# Patient Record
Sex: Female | Born: 1995 | Race: Black or African American | Hispanic: No | Marital: Single | Smoking: Never smoker
Health system: Southern US, Community
[De-identification: ages and names within clinical notes are randomized; demographics above are authoritative.]

## PROBLEM LIST (undated history)

## (undated) ENCOUNTER — Inpatient Hospital Stay (HOSPITAL_COMMUNITY): Payer: Self-pay

## (undated) DIAGNOSIS — R519 Headache, unspecified: Secondary | ICD-10-CM

## (undated) DIAGNOSIS — N390 Urinary tract infection, site not specified: Secondary | ICD-10-CM

## (undated) DIAGNOSIS — R87629 Unspecified abnormal cytological findings in specimens from vagina: Secondary | ICD-10-CM

## (undated) DIAGNOSIS — R51 Headache: Secondary | ICD-10-CM

## (undated) DIAGNOSIS — D649 Anemia, unspecified: Secondary | ICD-10-CM

## (undated) DIAGNOSIS — F419 Anxiety disorder, unspecified: Secondary | ICD-10-CM

## (undated) DIAGNOSIS — L309 Dermatitis, unspecified: Secondary | ICD-10-CM

## (undated) DIAGNOSIS — F32A Depression, unspecified: Secondary | ICD-10-CM

## (undated) HISTORY — PX: NO PAST SURGERIES: SHX2092

---

## 2013-02-24 ENCOUNTER — Emergency Department (HOSPITAL_COMMUNITY)
Admission: EM | Admit: 2013-02-24 | Discharge: 2013-02-24 | Disposition: A | Discharge status: Home / Self Care | Payer: BC Managed Care – PPO | Attending: Emergency Medicine | Admitting: Emergency Medicine

## 2013-02-24 ENCOUNTER — Encounter (HOSPITAL_COMMUNITY): Payer: Self-pay | Admitting: Emergency Medicine

## 2013-02-24 DIAGNOSIS — T23101A Burn of first degree of right hand, unspecified site, initial encounter: Secondary | ICD-10-CM

## 2013-02-24 DIAGNOSIS — Y929 Unspecified place or not applicable: Secondary | ICD-10-CM | POA: Insufficient documentation

## 2013-02-24 DIAGNOSIS — T23079A Burn of unspecified degree of unspecified wrist, initial encounter: Secondary | ICD-10-CM | POA: Insufficient documentation

## 2013-02-24 DIAGNOSIS — T23009A Burn of unspecified degree of unspecified hand, unspecified site, initial encounter: Secondary | ICD-10-CM | POA: Insufficient documentation

## 2013-02-24 DIAGNOSIS — W409XXA Explosion of unspecified explosive materials, initial encounter: Secondary | ICD-10-CM | POA: Insufficient documentation

## 2013-02-24 DIAGNOSIS — T2101XA Burn of unspecified degree of chest wall, initial encounter: Secondary | ICD-10-CM | POA: Insufficient documentation

## 2013-02-24 DIAGNOSIS — Y9389 Activity, other specified: Secondary | ICD-10-CM | POA: Insufficient documentation

## 2013-02-24 MED ORDER — SILVER SULFADIAZINE 1 % EX CREA: 1.0000 "application " | TOPICAL_CREAM | Freq: Every day | CUTANEOUS | Status: DC

## 2013-02-24 MED ORDER — SILVER SULFADIAZINE 1 % EX CREA
TOPICAL_CREAM | Freq: Once | CUTANEOUS | Status: AC
  Administered 2013-02-24: 20:00:00 via TOPICAL
  Filled 2013-02-24: qty 50
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
## 2013-02-24 NOTE — ED Provider Notes (Signed)
Medical screening examination/treatment/procedure(s) were performed by non-physician practitioner and as supervising physician I was immediately available for consultation/collaboration.  EKG Interpretation   None         Retaj Hilbun L Alcide Memoli, MD 02/24/13 2347 
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
## 2014-06-01 ENCOUNTER — Emergency Department (HOSPITAL_COMMUNITY)
Admission: EM | Admit: 2014-06-01 | Discharge: 2014-06-02 | Disposition: A | Discharge status: Home / Self Care | Payer: BLUE CROSS/BLUE SHIELD | Attending: Emergency Medicine | Admitting: Emergency Medicine

## 2014-06-01 ENCOUNTER — Encounter (HOSPITAL_COMMUNITY): Payer: Self-pay

## 2014-06-01 ENCOUNTER — Emergency Department (HOSPITAL_COMMUNITY): Payer: BLUE CROSS/BLUE SHIELD

## 2014-06-01 DIAGNOSIS — Z793 Long term (current) use of hormonal contraceptives: Secondary | ICD-10-CM | POA: Diagnosis not present

## 2014-06-01 DIAGNOSIS — F410 Panic disorder [episodic paroxysmal anxiety] without agoraphobia: Secondary | ICD-10-CM | POA: Diagnosis not present

## 2014-06-01 DIAGNOSIS — N920 Excessive and frequent menstruation with regular cycle: Secondary | ICD-10-CM | POA: Diagnosis not present

## 2014-06-01 DIAGNOSIS — Z3202 Encounter for pregnancy test, result negative: Secondary | ICD-10-CM | POA: Diagnosis not present

## 2014-06-01 DIAGNOSIS — N921 Excessive and frequent menstruation with irregular cycle: Secondary | ICD-10-CM

## 2014-06-01 DIAGNOSIS — R1030 Lower abdominal pain, unspecified: Secondary | ICD-10-CM

## 2014-06-01 DIAGNOSIS — R52 Pain, unspecified: Secondary | ICD-10-CM

## 2014-06-01 DIAGNOSIS — Z792 Long term (current) use of antibiotics: Secondary | ICD-10-CM | POA: Insufficient documentation

## 2014-06-01 HISTORY — DX: Anxiety disorder, unspecified: F41.9

## 2014-06-01 LAB — I-STAT BETA HCG BLOOD, ED (MC, WL, AP ONLY): I-stat hCG, quantitative: <5 m[IU]/mL (ref <5)

## 2014-06-01 LAB — CBC WITH DIFFERENTIAL/PLATELET
Basophils Absolute: 0 10*3/uL (ref 0.0–0.1)
Basophils Relative: 1 % (ref 0–1)
Eosinophils Absolute: 0 10*3/uL (ref 0.0–0.7)
Eosinophils Relative: 1 % (ref 0–5)
HCT: 38.3 % (ref 36.0–46.0)
Hemoglobin: 12.6 g/dL (ref 12.0–15.0)
Lymphocytes Relative: 33 % (ref 12–46)
Lymphs Abs: 1.3 10*3/uL (ref 0.7–4.0)
MCH: 28.7 pg (ref 26.0–34.0)
MCHC: 32.9 g/dL (ref 30.0–36.0)
MCV: 87.2 fL (ref 78.0–100.0)
Monocytes Absolute: 0.4 10*3/uL (ref 0.1–1.0)
Monocytes Relative: 9 % (ref 3–12)
Neutro Abs: 2.3 10*3/uL (ref 1.7–7.7)
Neutrophils Relative %: 56 % (ref 43–77)
Platelets: 260 10*3/uL (ref 150–400)
RBC: 4.39 MIL/uL (ref 3.87–5.11)
RDW: 13 % (ref 11.5–15.5)
WBC: 4.1 10*3/uL (ref 4.0–10.5)

## 2014-06-01 LAB — BASIC METABOLIC PANEL
Anion gap: 10 (ref 5–15)
BUN: 7 mg/dL (ref 6–23)
CO2: 23 mmol/L (ref 19–32)
Calcium: 9.3 mg/dL (ref 8.4–10.5)
Chloride: 106 mmol/L (ref 96–112)
Creatinine, Ser: 0.73 mg/dL (ref 0.50–1.10)
GFR calc Af Amer: >90 mL/min (ref >90)
GFR calc non Af Amer: >90 mL/min (ref >90)
Glucose, Bld: 83 mg/dL (ref 70–99)
Potassium: 4 mmol/L (ref 3.5–5.1)
Sodium: 139 mmol/L (ref 135–145)

## 2014-06-01 LAB — WET PREP, GENITAL
Clue Cells Wet Prep HPF POC: NONE SEEN
Trich, Wet Prep: NONE SEEN
WBC, Wet Prep HPF POC: NONE SEEN
Yeast Wet Prep HPF POC: NONE SEEN

## 2014-06-01 MED ORDER — LORAZEPAM 2 MG/ML IJ SOLN
1.0000 mg | Freq: Once | INTRAMUSCULAR | Status: AC
  Administered 2014-06-01: 1 mg via INTRAVENOUS

## 2014-06-01 MED ORDER — LORAZEPAM 2 MG/ML IJ SOLN
1.0000 mg | Freq: Once | INTRAMUSCULAR | Status: AC
  Administered 2014-06-01: 1 mg via INTRAVENOUS
  Filled 2014-06-01: qty 1

## 2014-06-01 MED ORDER — LORAZEPAM 2 MG/ML IJ SOLN
2.0000 mg | Freq: Once | INTRAMUSCULAR | Status: AC
  Administered 2014-06-01: 2 mg via INTRAVENOUS

## 2014-06-01 MED ORDER — SODIUM CHLORIDE 0.9 % IV BOLUS (SEPSIS)
1000.0000 mL | Freq: Once | INTRAVENOUS | Status: AC
  Administered 2014-06-01: 1000 mL via INTRAVENOUS

## 2014-06-01 MED ORDER — MORPHINE SULFATE 4 MG/ML IJ SOLN
4.0000 mg | Freq: Once | INTRAMUSCULAR | Status: AC
  Administered 2014-06-01: 4 mg via INTRAMUSCULAR
  Filled 2014-06-01: qty 1

## 2014-06-01 MED ORDER — LORAZEPAM 2 MG/ML IJ SOLN
INTRAMUSCULAR | Status: AC
  Filled 2014-06-01: qty 1

## 2014-06-01 MED ORDER — OXYCODONE-ACETAMINOPHEN 5-325 MG PO TABS
1.0000 | ORAL_TABLET | Freq: Once | ORAL | Status: AC
  Administered 2014-06-01: 1 via ORAL
  Filled 2014-06-01: qty 1

## 2014-06-01 NOTE — ED Provider Notes (Signed)
CSN: [id number]     Arrival date & time 06/01/14  1656 History   First MD Initiated Contact with Patient 06/01/14 1803     Chief Complaint  Patient presents with  . Menstrual Problem  . Abdominal Cramping     (Consider location/radiation/quality/duration/timing/severity/associated sxs/prior Treatment) HPI Pt is a 19yo female with hx of anxiety, presenting to ED with c/o a heavy menstrual cycle that started today with associated sharp cramping lower abdominal pain unrelieved with tylenol.  Pt states LMP was Mar 13, 2014.  States she is on a OCP and normally regular every month and never this heavy. States she has been on the same pill for over 1 year. Denies CP, SOB, or fatigue. Denies urinary symptoms. Not concerned for STDs. Pt has hx of anemia but was taken off iron pills as it had improved. She has never been pregnant and not concerned for pregnancy at this time. No hx of abdominal surgeries. no hx of ovarian cysts or fibroids. No other significant PMH at this time.   Past Medical History  Diagnosis Date  . Anxiety    History reviewed. No pertinent past surgical history. History reviewed. No pertinent family history. History  Substance Use Topics  . Smoking status: Never Smoker   . Smokeless tobacco: Not on file  . Alcohol Use: No   OB History    No data available     Review of Systems  Constitutional: Negative for fever, chills, appetite change and fatigue.  Respiratory: Negative for cough and shortness of breath.   Gastrointestinal: Positive for abdominal pain. Negative for nausea, vomiting and diarrhea.  Genitourinary: Positive for vaginal bleeding, menstrual problem (irregular, heavy) and pelvic pain. Negative for dysuria, urgency, frequency, flank pain, decreased urine volume and vaginal discharge.  Musculoskeletal: Negative for myalgias and back pain.  All other systems reviewed and are negative.     Allergies  Cefzil and Rocephin  Home Medications   Prior to  Admission medications   Medication Sig Start Date End Date Taking? Authorizing Provider  acetaminophen (TYLENOL) 500 MG tablet Take 1,000 mg by mouth every 6 (six) hours as needed for moderate pain.   Yes Historical Provider, MD  norethindrone (MICRONOR,CAMILA,ERRIN) 0.35 MG tablet Take 1 tablet by mouth daily.   Yes Historical Provider, MD  silver sulfADIAZINE (SILVADENE) 1 % cream Apply 1 application topically daily. 02/24/13   Roxy Horseman, PA-C   BP 111/67 mmHg  Pulse 69  Temp(Src) 98.4 F (36.9 C) (Oral)  Resp 15  SpO2 100%  LMP 06/01/2014 Physical Exam  Constitutional: She appears well-developed and well-nourished. No distress.  HENT:  Head: Normocephalic and atraumatic.  Eyes: Conjunctivae are normal. No scleral icterus.  Neck: Normal range of motion.  Cardiovascular: Normal rate, regular rhythm and normal heart sounds.   Pulmonary/Chest: Effort normal and breath sounds normal. No respiratory distress. She has no wheezes. She has no rales. She exhibits no tenderness.  Abdominal: Soft. Bowel sounds are normal. She exhibits no distension and no mass. There is tenderness. There is no rebound, no guarding and no CVA tenderness.  Soft, non-distended, tenderness along lower pelvis.   Genitourinary:  Chaperoned exam. Normal external genitalia. Vaginal canal- moderate amount vaginal bleeding c/w menstrual cycle. No masses. No discharge. CMT, bilateral adnexal tenderness w/o palpable masses.  Musculoskeletal: Normal range of motion.  Neurological: She is alert.  Skin: Skin is warm and dry. She is not diaphoretic.  Nursing note and vitals reviewed.   ED Course  Procedures (including critical  care time) Labs Review Labs Reviewed  WET PREP, GENITAL  CBC WITH DIFFERENTIAL/PLATELET  BASIC METABOLIC PANEL  I-STAT BETA HCG BLOOD, ED ([facility], [facility], [facility] ONLY)  GC/CHLAMYDIA PROBE AMP ([place])    Imaging Review [place] Transvaginal Non-ob  06/01/2014   CLINICAL DATA:  Low pelvic pain  EXAM:  TRANSABDOMINAL AND TRANSVAGINAL ULTRASOUND OF PELVIS  TECHNIQUE: Both transabdominal and transvaginal ultrasound examinations of the pelvis were performed. Transabdominal technique was performed for global imaging of the pelvis including uterus, ovaries, adnexal regions, and pelvic cul-de-sac. It was necessary to proceed with endovaginal exam following the transabdominal exam to visualize the endometrium and ovaries.  COMPARISON:  None  FINDINGS: Uterus  Measurements: 8.9 by 4.1 x 3.6 cm. No fibroids or other mass visualized.  Endometrium  Thickness: 0.8 cm, mildly inhomogeneous at its mid portion without measurable focal abnormality. No focal abnormality visualized.  Right ovary  Measurements: 3.7 x 2.0 x 1.8 cm. Normal appearance/no adnexal mass.  Left ovary  Measurements: 2.8 x 2.2 x 2.2 cm. Normal appearance/no adnexal mass.  Other findings  Trace free fluid in the cul-de-sac  IMPRESSION: Normal exam.   Electronically Signed   By: Christiana PellantGretchen  Green M.D.   On: 06/01/2014 20:33   [place] Pelvis Complete  06/01/2014   CLINICAL DATA:  Low pelvic pain  EXAM: TRANSABDOMINAL AND TRANSVAGINAL ULTRASOUND OF PELVIS  TECHNIQUE: Both transabdominal and transvaginal ultrasound examinations of the pelvis were performed. Transabdominal technique was performed for global imaging of the pelvis including uterus, ovaries, adnexal regions, and pelvic cul-de-sac. It was necessary to proceed with endovaginal exam following the transabdominal exam to visualize the endometrium and ovaries.  COMPARISON:  None  FINDINGS: Uterus  Measurements: 8.9 by 4.1 x 3.6 cm. No fibroids or other mass visualized.  Endometrium  Thickness: 0.8 cm, mildly inhomogeneous at its mid portion without measurable focal abnormality. No focal abnormality visualized.  Right ovary  Measurements: 3.7 x 2.0 x 1.8 cm. Normal appearance/no adnexal mass.  Left ovary  Measurements: 2.8 x 2.2 x 2.2 cm. Normal appearance/no adnexal mass.  Other findings  Trace free fluid in  the cul-de-sac  IMPRESSION: Normal exam.   Electronically Signed   By: Christiana PellantGretchen  Green M.D.   On: 06/01/2014 20:33     EKG Interpretation None      MDM   Final diagnoses:  Menorrhagia with irregular cycle  Lower abdominal pain  Panic attack    Pt is a 19yo female presenting to ED with c/o irregular heavy menstrual cycle that started today, last cycle was 03/13/14.  Pt c/o lower abdominal cramping. Pt tender to lower abdomen on exam.   Labs: unremarkable including beta hCG Pelvic U/S: unremarkable, no evidence of ovarian cyst, fibroid or TOA.   8:58 PM Called into room by Orie Fishermanhristina Goss, RN, pt hyperventilating, hands clenched.  Pt appears to be having a panic attack. Labs and imaging unrekarkable. Pt given 1mg  ativan IM w/o improvement. IV secured, 1mg  ativan given IM w/o relief.  Dr. Ethelda ChickJacubowitz examined pt, agrees pt having a panic attack will give another 2mg  IV ativan.   11:10 PM pt still sleeping soundly in exam room, aroused with verbal stimulus.   12:21 AM Pt still fatigued but awakes easily with verbal stimuli, follows simple commands.  Gave pt crackers, peanut butter, and juice, encouraged to eat and drink as tolerated.      [facility] on irregular heavy menstrual  cycles. Return precautions provided. Pt verbalized understanding and agreement with tx plan.   Junius Finner, PA-C 06/02/14 0104  Doug Sou, MD 06/03/14 [phone]

## 2014-06-01 NOTE — ED Notes (Signed)
Patient up talking, eyes open, breathing equal and unlabored.

## 2014-06-01 NOTE — ED Notes (Signed)
Pt returned from US

## 2014-06-01 NOTE — ED Notes (Addendum)
Pt is relaxed, and sleeping. Pt placed on 3L of O2.

## 2014-06-01 NOTE — ED Notes (Signed)
Pt started having anxiety attack, breathing hard, Margarita RanaErin Omalley at bedside. Given 1 IM shot of ativan.

## 2014-06-01 NOTE — ED Provider Notes (Signed)
Patient presented from menstrual bleeding. After arrival here patient of dyspnea. With carpopedal spasm. Her boyfriend writes that she has a history of anxiety. 10 PM after treatment with Ativan patient is sleepy arousable to verbal stimulus. Follow simple commands.  Doug SouSam Kinsly Hild, MD 06/01/14 2158

## 2014-06-01 NOTE — ED Notes (Signed)
Pt started period today, prior period was Mar 13, 2014, bleeding heavy, has been through 6 regular tampons since 9am.  Having sharp lower abd cramping.  No other s/s noted.

## 2014-06-01 NOTE — ED Notes (Signed)
Diane [name] At bedside, pt continues to breath heavily

## 2014-06-02 MED ORDER — TRAMADOL HCL 50 MG PO TABS: 50.0000 mg | ORAL_TABLET | Freq: Four times a day (QID) | ORAL | Status: DC | PRN

## 2014-06-02 MED ORDER — ONDANSETRON 4 MG PO TBDP
8.0000 mg | ORAL_TABLET | Freq: Once | ORAL | Status: AC
  Administered 2014-06-02: 8 mg via ORAL
  Filled 2014-06-02: qty 2

## 2014-06-02 MED ORDER — NAPROXEN 500 MG PO TABS: 500.0000 mg | ORAL_TABLET | Freq: Two times a day (BID) | ORAL | Status: DC

## 2014-06-02 NOTE — ED Notes (Signed)
Pt had an episode of emesis when she stood up from the bed.

## 2014-06-03 LAB — GC/CHLAMYDIA PROBE AMP (~~LOC~~) NOT AT ARMC
Chlamydia: NEGATIVE
Neisseria Gonorrhea: NEGATIVE

## 2014-06-24 ENCOUNTER — Other Ambulatory Visit: Payer: Self-pay | Admitting: Advanced Practice Midwife

## 2014-10-15 ENCOUNTER — Emergency Department (HOSPITAL_COMMUNITY)
Admission: EM | Admit: 2014-10-15 | Discharge: 2014-10-15 | Disposition: A | Discharge status: Home / Self Care | Payer: BLUE CROSS/BLUE SHIELD | Attending: Emergency Medicine | Admitting: Emergency Medicine

## 2014-10-15 ENCOUNTER — Encounter (HOSPITAL_COMMUNITY): Payer: Self-pay | Admitting: Neurology

## 2014-10-15 DIAGNOSIS — Z79899 Other long term (current) drug therapy: Secondary | ICD-10-CM | POA: Insufficient documentation

## 2014-10-15 DIAGNOSIS — J029 Acute pharyngitis, unspecified: Secondary | ICD-10-CM | POA: Diagnosis not present

## 2014-10-15 DIAGNOSIS — R112 Nausea with vomiting, unspecified: Secondary | ICD-10-CM | POA: Diagnosis not present

## 2014-10-15 DIAGNOSIS — F410 Panic disorder [episodic paroxysmal anxiety] without agoraphobia: Secondary | ICD-10-CM

## 2014-10-15 DIAGNOSIS — R6883 Chills (without fever): Secondary | ICD-10-CM | POA: Diagnosis not present

## 2014-10-15 DIAGNOSIS — Z791 Long term (current) use of non-steroidal anti-inflammatories (NSAID): Secondary | ICD-10-CM | POA: Insufficient documentation

## 2014-10-15 MED ORDER — HYDROXYZINE HCL 50 MG/ML IM SOLN
25.0000 mg | Freq: Four times a day (QID) | INTRAMUSCULAR | Status: DC | PRN
  Administered 2014-10-15: 25 mg via INTRAMUSCULAR
  Filled 2014-10-15: qty 0.5

## 2014-10-15 MED ORDER — HYDROXYZINE HCL 25 MG PO TABS: 25.0000 mg | ORAL_TABLET | Freq: Four times a day (QID) | ORAL | Status: DC

## 2014-10-15 NOTE — ED Notes (Signed)
Family at bedside. 

## 2014-10-15 NOTE — ED Notes (Signed)
Pt appears to be having a panic attack, is hyperventilating. Has hx of anxiety. Has family at bedside. Pt will not talk to RN at this time. Is 100% RA.

## 2014-10-15 NOTE — Discharge Instructions (Signed)
Panic Attacks °Panic attacks are sudden, short-lived surges of severe anxiety, fear, or discomfort. They may occur for no reason when you are relaxed, when you are anxious, or when you are sleeping. Panic attacks may occur for a number of reasons:  °· Healthy people occasionally have panic attacks in extreme, life-threatening situations, such as war or natural disasters. Normal anxiety is a protective mechanism of the body that helps us react to danger (fight or flight response). °· Panic attacks are often seen with anxiety disorders, such as panic disorder, social anxiety disorder, generalized anxiety disorder, and phobias. Anxiety disorders cause excessive or uncontrollable anxiety. They may interfere with your relationships or other life activities. °· Panic attacks are sometimes seen with other mental illnesses, such as depression and posttraumatic stress disorder. °· Certain medical conditions, prescription medicines, and drugs of abuse can cause panic attacks. °SYMPTOMS  °Panic attacks start suddenly, peak within 20 minutes, and are accompanied by four or more of the following symptoms: °· Pounding heart or fast heart rate (palpitations). °· Sweating. °· Trembling or shaking. °· Shortness of breath or feeling smothered. °· Feeling choked. °· Chest pain or discomfort. °· Nausea or strange feeling in your stomach. °· Dizziness, light-headedness, or feeling like you will faint. °· Chills or hot flushes. °· Numbness or tingling in your lips or hands and feet. °· Feeling that things are not real or feeling that you are not yourself. °· Fear of losing control or going crazy. °· Fear of dying. °Some of these symptoms can mimic serious medical conditions. For example, you may think you are having a heart attack. Although panic attacks can be very scary, they are not life threatening. °DIAGNOSIS  °Panic attacks are diagnosed through an assessment by your health care provider. Your health care provider will ask  questions about your symptoms, such as where and when they occurred. Your health care provider will also ask about your medical history and use of alcohol and drugs, including prescription medicines. Your health care provider may order blood tests or other studies to rule out a serious medical condition. Your health care provider may refer you to a mental health professional for further evaluation. °TREATMENT  °· Most healthy people who have one or two panic attacks in an extreme, life-threatening situation will not require treatment. °· The treatment for panic attacks associated with anxiety disorders or other mental illness typically involves counseling with a mental health professional, medicine, or a combination of both. Your health care provider will help determine what treatment is best for you. °· Panic attacks due to physical illness usually go away with treatment of the illness. If prescription medicine is causing panic attacks, talk with your health care provider about stopping the medicine, decreasing the dose, or substituting another medicine. °· Panic attacks due to alcohol or drug abuse go away with abstinence. Some adults need professional help in order to stop drinking or using drugs. °HOME CARE INSTRUCTIONS  °· Take all medicines as directed by your health care provider.   °· Schedule and attend follow-up visits as directed by your health care provider. It is important to keep all your appointments. °SEEK MEDICAL CARE IF: °· You are not able to take your medicines as prescribed. °· Your symptoms do not improve or get worse. °SEEK IMMEDIATE MEDICAL CARE IF:  °· You experience panic attack symptoms that are different than your usual symptoms. °· You have serious thoughts about hurting yourself or others. °· You are taking medicine for panic attacks and   have a serious side effect. °MAKE SURE YOU: °· Understand these instructions. °· Will watch your condition. °· Will get help right away if you are not  doing well or get worse. °Document Released: 02/15/2005 Document Revised: 02/20/2013 Document Reviewed: 09/29/2012 °ExitCare® Patient Information ©2015 [employer]. This information is not intended to replace advice given to you by your health care provider. Make sure you discuss any questions you have with your health care provider. ° °Emergency Department Resource Guide °1) Find a Doctor and Pay Out of Pocket °Although you won't have to find out who is covered by your insurance plan, it is a good idea to ask around and get recommendations. You will then need to call the office and see if the doctor you have chosen will accept you as a new patient and what types of options they offer for patients who are self-pay. Some doctors offer discounts or will set up payment plans for their patients who do not have insurance, but you will need to ask so you aren't surprised when you get to your appointment. ° °2) Contact Your Local Health Department °Not all health departments have doctors that can see patients for sick visits, but many do, so it is worth a call to see if yours does. If you don't know where your local health department is, you can check in your phone book. The CDC also has a tool to help you locate your state's health department, and many state websites also have listings of all of their local health departments. ° °3) Find a Walk-in Clinic °If your illness is not likely to be very severe or complicated, you may want to try a walk in clinic. These are popping up all over the country in pharmacies, drugstores, and shopping centers. They're usually staffed by nurse practitioners or physician assistants that have been trained to treat common illnesses and complaints. They're usually fairly quick and inexpensive. However, if you have serious medical issues or chronic medical problems, these are probably not your best option. ° °No Primary Care Doctor: °- Call [facility] at  832-8000 - they can help you  locate a primary care doctor that  accepts your insurance, provides certain services, etc. °- Physician Referral Service- 1-800-533-3463 ° °Chronic Pain Problems: °Organization         Address  Phone   Notes  °[facility]  (336) 297-2271 Patients need to be referred by their primary care doctor.  ° °Medication Assistance: °Organization         Address  Phone   Notes  °[place] [facility] [address] °[place], [place] [address] (336) 641-8030 --Must be a resident of [place] °-- Must have NO insurance coverage whatsoever (no Medicaid/ Medicare, etc.) °-- The pt. MUST have a primary care doctor that directs their care regularly and follows them in the community °  °[facility]  (866) 331-1348   °[employer]  (888) 892-1162   ° °Agencies that provide inexpensive medical care: °Organization         Address  Phone   Notes  °[facility]  (336) 832-8035   °[facility]    (336) 832-7272   °[facility] [address] °[place], [place] [address] (336) 832-4777   °[facility] [address], °[place] (336) 271-4999   °[facility]    (336) 373-0678   °[facility]    (336) 272-1050   °  [facility] ° [address], [place] Phone:  (336) 832-4444, Fax:  (336) 832-4440 Hours of Operation:  9 am - 6 pm, M-F.  Also accepts Medicaid/Medicare and self-pay.  °[facility] ° [address], [place] Phone: (336) 832-3150, Fax: (336) 832-3151. Hours of Operation:  8:30 am - 5:30 pm, M-F.  Also accepts Medicaid and self-pay.  °[facility] [place] [address], [place] Phone: (336) 878-6027   °[facility] [address], [place], [place] (336)723-1848, Ext. 123 Mondays & Thursdays: 7-9 AM.  First 15 patients are seen on a first come, first serve basis. °  ° °Medicaid-accepting [place]  Providers: ° °Organization         Address  Phone   Notes  °[facility] [address], [place] (336) 641-2100 Also accepts self-pay patients.  °[facility] [address], [place] ° (336) 856-9996   °[facility] [address], [place] (336) 288-8857   °[facility] [address] [place] [address], [place] (336) 299-7000   °[facility] [address], [place]  ° (336) 373-1557 Only accepts  Access Medicaid patients after they have their name applied to their card.  ° °Self-Pay (no insurance) in [place]: ° °Organization         Address  Phone   Notes  °Sickle Cell Patients, [facility] [address], [place] (336) 832-1970   °[facility] [address], [place] (336) 832-4400   °[facility] ° [address], [place] (336) 992-4800   °[facility]/Dr. Osei-Bonsu ° [address] [place] [address], [place] or [address], [place] (336) 841-8500 Phone number for both [place] and [place] locations is the same.  °[facility] [address], [place] (336) 299-0000   °[facility] [address] [place] [address], [place] or [address] (336) 852-7530 °(336) 878-2260   °[facility] [address], [place] (336) 350-1642, phone; (336) 294-5005, fax Sees patients 1st and 3rd Saturday of every month.  Must not qualify for public or private insurance (i.e. Medicaid, Medicare, [place] Health Choice, Veterans' Benefits) • Household income should be no more than 200% of the poverty level •The clinic cannot treat you if you are pregnant or think you are pregnant • Sexually transmitted diseases are not treated at the clinic.  ° ° °Dental Care: °Organization         Address  Phone  Notes  °[place] [facility] [address],  (336) 641-6152 Accepts children up to age 21 who are enrolled in Medicaid or [place] Health Choice; pregnant women with a Medicaid card; and children who have applied for Medicaid or [place] Health Choice, but were declined, whose parents can pay a reduced fee at time of service.  °[facility] [place]  [address], [place] (336) 641-7733 Accepts children up to age 21 who are enrolled in Medicaid or [place] Health Choice; pregnant women with a Medicaid card; and children who have applied for Medicaid or [place] Health Choice, but were declined, whose parents can pay a reduced fee at time of service.  °[facility] ° [address], [place] (336) 641-4533 Patients are seen by appointment only. Walk-ins are not accepted. [facility] will see patients 18 years of age and older. °Monday - Tuesday (8am-5pm) °Most Wednesdays (8:30-5pm) °$30 per visit, cash only  °[facility] ° [address], [place] (336) 641-4533 Patients are seen by appointment only. Walk-ins are not accepted. [facility] will see patients 18 years of age and older. °One Wednesday Evening (Monthly: Volunteer Based).  $30 per visit, cash only  °[facility]  (919) 537-3737 for adults; Children under age 4, call Graduate Pediatric Dentistry at ([phone]) [phone]. Children aged 4-14, please call ([phone]) [phone] to request a pediatric application. ° Dental services are provided in all areas of dental care including fillings, crowns and bridges, complete and partial dentures, implants, gum treatment, root canals, and extractions. Preventive care is also provided. Treatment is provided to both adults and children. °Patients are selected via a lottery and there is often a waiting list. °  °[facility] [address], °[place] ° (336) 763-8833 [email or web address] °  °[facility]  [address], [place], [place] (336)723-1848, Ext. 123 Second and Fourth Thursday of each month, opens at 6:30 AM; Clinic ends at 9 AM.  Patients are seen on a first-come first-served basis, and a limited number are seen during each clinic.  ° °[facility] ° [address], [place], [place] (336) 723-7904   Eligibility Requirements °You must have lived in [place], or [place] counties for at least the last three months. °  You cannot be eligible for state or federal sponsored healthcare insurance, including Veterans Administration, Medicaid, or Medicare. °  You generally cannot be eligible for healthcare insurance through your employer.  °  How to apply: °Eligibility screenings are held every Tuesday and Wednesday afternoon from 1:00 pm until 4:00 pm. You do not need an appointment for the interview!  °[facility] [address], [place], [place] 336-631-2330   °[facility]  336-342-8273   °[facility]  336-703-3100   °[facility]  336-570-6415   ° °Behavioral Health Resources in the Community: °Intensive Outpatient Programs °Organization         Address  Phone  Notes  °[facility] [address], [place], [place] 336-878-6098   °[facility] [address], [place], [place] 336-832-9800   °[facility] [address], [place], [place] ° 336-882-2125   °[facility] [address],  °[place],  1-800-853-5163 or 336-641-4981   °Substance Abuse Resources °Organization         Address  Phone  Notes  °[facility]  336-882-2125   °[facility]  336-784-9470   °[facility]  336-285-9073   °[facility]  336-845-3988   °Residential & Outpatient Substance Abuse Program  1-800-659-3381   °Psychological Services °Organization         Address  Phone  Notes  °[facility]  336- 832-9600     °[facility]  336- 378-7881   °[facility] [address], [place] 1-800-853-5163 or 336-641-4981   ° °Mobile Crisis Teams °Organization         Address  Phone  Notes  °[facility], Mobile Crisis Care Unit  1-877-626-1772   °Assertive °[facility] ° [address] ,  336-834-9664   °  [facility] [address] [place] 629-528-4132    Self-Help/Support Groups Organization         Address  Phone             Notes  [facility] [place] - variety of support groups  336- I7437963 Call for more information  Narcotics Anonymous (NA), [facility] [address], [employer] [place]  2 meetings at this location   Statistician         Address  Phone  Notes  [facility] [address],    [place]  4-401-027-2536   [facility]  [address] [place] [address], [place], [place] 742-595-6387   [facility] [address] [place], [place] 564-332-9518 Admissions: 8am-3pm M-F  [facility] [address],    [place], [place] 841-660-6301   [facility] [address] [place], [place], [place] 601-093-2355   [facility] [address],  [place], [place] 732-202-5427   [facility] - Intensive Outpatient [address], [place], [place] 062-376-2831   [facility] ([facility] [address] [place]  [place], [place] 5-176-160-7371 or 417-494-1061   [facility] [address], [place], [place] 270-350-0938 Accepts Medicaid  [facility] [address],  [place] 1-829-937-1696 Substance Abuse/Addiction Treatment   [facility]         Address  Phone  Notes  [facility]  (316)756-5858   Angie Fava, PhD [address] [place], [place]   330-376-2821 or 445-243-8747    [facility]   [address] [place], [place] (570) 264-7268   [facility] [address] [place], [place] 8166318076 Insurance/Medicaid/sponsorship through [facility] [address]                                    [place], [place] 216-621-4262 Therapy/tele-psych/case  [facility] [address], [place] (740)[phone]    Dr. Lolly Mustache  [phone]   [facility]  [employer] [facility] Dept. 1) [address] [place] 2) [address] [place] 3)  [address], [place] ([phone] (641)[phone]  305-840-1321   [facility] Child Abuse Hotline 914 062 5482 or (913) 027-0261 (After Hours)    If you continue to have anxiety issues U been given a resource list that shows the availability of mental health counseling.  Please call and make an appointment You have been given a prescription for Vistaril, which is the medication received in the emergency room tonight that will help if you can develop extreme anxiety

## 2014-10-15 NOTE — ED Provider Notes (Signed)
CSN: [id number]     Arrival date & time 10/15/14  1824 History  This chart was scribed for Earley Favor, NP working with Jerelyn Scott, MD by Placido Sou, ED Scribe. This patient was seen in room TR02C/TR02C and the patient's care was started at 8:02 PM.    Chief Complaint  Patient presents with  . Panic Attack   The history is provided by the patient, a parent and a relative. No language interpreter was used.    HPI Comments: Diane [name] is a 19 y.o. female, with a hx of anxiety, who presents to the Emergency Department due to hyperventilation. Pt was on oxygen, unresponsive and not speaking to the provider initially and has now woken up and is answering questions. Pt notes having had a sore throat for the past 2 days and began taking ibuprofen and amoxacillin 1 day ago for her symptoms. She notes that today she has experienced worsening pain and chills with 1x episode of vomiting which caused her to panic. Her mother notes that she has a hx of panic attacks but has not had a reoccurrence for some time but has been having more recently.  Past Medical History  Diagnosis Date  . Anxiety    History reviewed. No pertinent past surgical history. No family history on file. Social History  Substance Use Topics  . Smoking status: Never Smoker   . Smokeless tobacco: None  . Alcohol Use: No   OB History    No data available     Review of Systems  Constitutional: Positive for chills. Negative for fever.  HENT: Positive for sore throat and voice change. Negative for rhinorrhea.   Gastrointestinal: Positive for nausea and vomiting.  Skin: Negative for rash and wound.  Psychiatric/Behavioral: The patient is nervous/anxious.   All other systems reviewed and are negative.  Allergies  Cefzil and Rocephin  Home Medications   Prior to Admission medications   Medication Sig Start Date End Date Taking? Authorizing Provider  acetaminophen (TYLENOL) 500 MG tablet Take 1,000 mg by mouth every  6 (six) hours as needed for moderate pain.    Historical Provider, MD  hydrOXYzine (ATARAX/VISTARIL) 25 MG tablet Take 1 tablet (25 mg total) by mouth every 6 (six) hours. 10/15/14   Earley Favor, NP  naproxen (NAPROSYN) 500 MG tablet Take 1 tablet (500 mg total) by mouth 2 (two) times daily. 06/02/14   Junius Finner, PA-C  norethindrone (MICRONOR,CAMILA,ERRIN) 0.35 MG tablet Take 1 tablet by mouth daily.    Historical Provider, MD  silver sulfADIAZINE (SILVADENE) 1 % cream Apply 1 application topically daily. 02/24/13   Roxy Horseman, PA-C  traMADol (ULTRAM) 50 MG tablet Take 1 tablet (50 mg total) by mouth every 6 (six) hours as needed. 06/02/14   Junius Finner, PA-C   BP 119/80 mmHg  Pulse 105  Temp(Src) 98.3 F (36.8 C) (Oral)  Resp 28  SpO2 100% Physical Exam  Constitutional: She is oriented to person, place, and time. She appears well-developed and well-nourished. No distress.  HENT:  Head: Normocephalic and atraumatic.  Mouth/Throat: No oropharyngeal exudate.  Eyes: Right eye exhibits no discharge. Left eye exhibits no discharge.  Neck: Normal range of motion. No tracheal deviation present.  Cardiovascular: Normal rate.   Pulmonary/Chest: Effort normal. No respiratory distress.  Abdominal: Soft. There is no tenderness.  Musculoskeletal: Normal range of motion.  Neurological: She is alert and oriented to person, place, and time.  Skin: Skin is warm and dry. She is not diaphoretic.  Psychiatric: She has a normal mood and affect. Her behavior is normal.  Nursing note and vitals reviewed.   ED Course  Procedures  DIAGNOSTIC STUDIES: Oxygen Saturation is 100% on RA, normal by my interpretation.    COORDINATION OF CARE: 8:04 PM Discussed treatment plan with pt at bedside and pt agreed to plan.  Labs Review Labs Reviewed - No data to display  Imaging Review No results found. I have personally reviewed and evaluated these images and lab results as part of my medical  decision-making.   EKG Interpretation None     after IM Vistaril.  Patient is now alert and oriented and can relate the situation.  She is, O2 sat 100% on room air.  She is no longer tachycardic with a rate of 76  MDM   Final diagnoses:  Panic attack   I personally performed the services described in this documentation, which was scribed in my presence. The recorded information has been reviewed and is accurate.  Earley Favor, NP 10/15/14 4081  Jerelyn Scott, MD 10/15/14 [phone]

## 2014-12-06 LAB — OB RESULTS CONSOLE ABO/RH: RH Type: NEGATIVE

## 2014-12-06 LAB — OB RESULTS CONSOLE RPR: RPR: NONREACTIVE

## 2014-12-06 LAB — OB RESULTS CONSOLE HEPATITIS B SURFACE ANTIGEN: HEP B S AG: NEGATIVE

## 2014-12-06 LAB — OB RESULTS CONSOLE GC/CHLAMYDIA
Chlamydia: NEGATIVE
GC PROBE AMP, GENITAL: NEGATIVE

## 2014-12-06 LAB — OB RESULTS CONSOLE RUBELLA ANTIBODY, IGM: RUBELLA: IMMUNE

## 2014-12-06 LAB — OB RESULTS CONSOLE HIV ANTIBODY (ROUTINE TESTING): HIV: NONREACTIVE

## 2015-02-24 ENCOUNTER — Encounter (HOSPITAL_COMMUNITY): Payer: Self-pay | Admitting: *Deleted

## 2015-02-24 ENCOUNTER — Inpatient Hospital Stay (HOSPITAL_COMMUNITY)
Admission: AD | Admit: 2015-02-24 | Discharge: 2015-02-24 | Disposition: A | Discharge status: Home / Self Care | Payer: BLUE CROSS/BLUE SHIELD | Source: Ambulatory Visit | Attending: Obstetrics and Gynecology | Admitting: Obstetrics and Gynecology

## 2015-02-24 DIAGNOSIS — M549 Dorsalgia, unspecified: Secondary | ICD-10-CM | POA: Insufficient documentation

## 2015-02-24 DIAGNOSIS — Z3A16 16 weeks gestation of pregnancy: Secondary | ICD-10-CM | POA: Diagnosis not present

## 2015-02-24 DIAGNOSIS — Z881 Allergy status to other antibiotic agents status: Secondary | ICD-10-CM | POA: Diagnosis not present

## 2015-02-24 DIAGNOSIS — O9989 Other specified diseases and conditions complicating pregnancy, childbirth and the puerperium: Secondary | ICD-10-CM | POA: Diagnosis not present

## 2015-02-24 DIAGNOSIS — F419 Anxiety disorder, unspecified: Secondary | ICD-10-CM | POA: Diagnosis not present

## 2015-02-24 DIAGNOSIS — O26899 Other specified pregnancy related conditions, unspecified trimester: Secondary | ICD-10-CM

## 2015-02-24 DIAGNOSIS — M5489 Other dorsalgia: Secondary | ICD-10-CM

## 2015-02-24 DIAGNOSIS — R109 Unspecified abdominal pain: Secondary | ICD-10-CM | POA: Diagnosis not present

## 2015-02-24 HISTORY — DX: Headache, unspecified: R51.9

## 2015-02-24 HISTORY — DX: Urinary tract infection, site not specified: N39.0

## 2015-02-24 HISTORY — DX: Anemia, unspecified: D64.9

## 2015-02-24 HISTORY — DX: Headache: R51

## 2015-02-24 LAB — URINALYSIS, ROUTINE W REFLEX MICROSCOPIC
Bilirubin Urine: NEGATIVE
GLUCOSE, UA: NEGATIVE mg/dL
HGB URINE DIPSTICK: NEGATIVE
Ketones, ur: NEGATIVE mg/dL
Leukocytes, UA: NEGATIVE
Nitrite: NEGATIVE
PH: 6 (ref 5.0–8.0)
Protein, ur: NEGATIVE mg/dL

## 2015-02-24 MED ORDER — IBUPROFEN 600 MG PO TABS
600.0000 mg | ORAL_TABLET | Freq: Once | ORAL | Status: AC
  Administered 2015-02-24: 600 mg via ORAL
  Filled 2015-02-24: qty 1

## 2015-02-24 NOTE — MAU Note (Signed)
Patient presents to mau following tripping and falling earlier this evening around 2 hours ago. Since the fall patient endorses abdominal cramping and lower back pain. Has tried tylenol for the pain with no relief. Denies vaginal bleeding at this time. States received PNC at [facility]; Due Date 08/10/15.

## 2015-02-24 NOTE — Discharge Instructions (Signed)
Back Pain in Pregnancy °Back pain during pregnancy is common. It happens in about half of all pregnancies. It is important for you and your baby that you remain active during your pregnancy. If you feel that back pain is not allowing you to remain active or sleep well, it is time to see your caregiver. Back pain may be caused by several factors related to changes during your pregnancy. Fortunately, unless you had trouble with your back before your pregnancy, the pain is likely to get better after you deliver. °Low back pain usually occurs between the fifth and seventh months of pregnancy. It can, however, happen in the first couple months. Factors that increase the risk of back problems include:  °· Previous back problems. °· Injury to your back. °· Having twins or multiple births. °· A chronic cough. °· Stress. °· Job-related repetitive motions. °· Muscle or spinal disease in the back. °· Family history of back problems, ruptured (herniated) discs, or osteoporosis. °· Depression, anxiety, and panic attacks. °CAUSES  °· When you are pregnant, your body produces a hormone called relaxin. This hormone makes the ligaments connecting the low back and pubic bones more flexible. This flexibility allows the baby to be delivered more easily. When your ligaments are loose, your muscles need to work harder to support your back. Soreness in your back can come from tired muscles. Soreness can also come from back tissues that are irritated since they are receiving less support. °· As the baby grows, it puts pressure on the nerves and blood vessels in your pelvis. This can cause back pain. °· As the baby grows and gets heavier during pregnancy, the uterus pushes the stomach muscles forward and changes your center of gravity. This makes your back muscles work harder to maintain good posture. °SYMPTOMS  °Lumbar pain during pregnancy °Lumbar pain during pregnancy usually occurs at or above the waist in the center of the back. There  may be pain and numbness that radiates into your leg or foot. This is similar to low back pain experienced by non-pregnant women. It usually increases with sitting for long periods of time, standing, or repetitive lifting. Tenderness may also be present in the muscles along your upper back. °Posterior pelvic pain during pregnancy °Pain in the back of the pelvis is more common than lumbar pain in pregnancy. It is a deep pain felt in your side at the waistline, or across the tailbone (sacrum), or in both places. You may have pain on one or both sides. This pain can also go into the buttocks and backs of the upper thighs. Pubic and groin pain may also be present. The pain does not quickly resolve with rest, and morning stiffness may also be present. °Pelvic pain during pregnancy can be brought on by most activities. A high level of fitness before and during pregnancy may or may not prevent this problem. Labor pain is usually 1 to 2 minutes apart, lasts for about 1 minute, and involves a bearing down feeling or pressure in your pelvis. However, if you are at term with the pregnancy, constant low back pain can be the beginning of early labor, and you should be aware of this. °DIAGNOSIS  °X-rays of the back should not be done during the first 12 to 14 weeks of the pregnancy and only when absolutely necessary during the rest of the pregnancy. MRIs do not give off radiation and are safe during pregnancy. MRIs also should only be done when absolutely necessary. °HOME CARE INSTRUCTIONS °· Exercise   as directed by your caregiver. Exercise is the most effective way to prevent or manage back pain. If you have a back problem, it is especially important to avoid sports that require sudden body movements. Swimming and walking are great activities. °· Do not stand in one place for long periods of time. °· Do not wear high heels. °· Sit in chairs with good posture. Use a pillow on your lower back if necessary. Make sure your head  rests over your shoulders and is not hanging forward. °· Try sleeping on your side, preferably the left side, with a pillow or two between your legs. If you are sore after a night's rest, your bed may be too soft. Try placing a board between your mattress and box spring. °· Listen to your body when lifting. If you are experiencing pain, ask for help or try bending your knees more so you can use your leg muscles rather than your back muscles. Squat down when picking up something from the floor. Do not bend over. °· Eat a healthy diet. Try to gain weight within your caregiver's recommendations. °· Use heat or cold packs 3 to 4 times a day for 15 minutes to help with the pain. °· Only take over-the-counter or prescription medicines for pain, discomfort, or fever as directed by your caregiver. °Sudden (acute) back pain °· Use bed rest for only the most extreme, acute episodes of back pain. Prolonged bed rest over 48 hours will aggravate your condition. °· Ice is very effective for acute conditions. °¨ Put ice in a plastic bag. °¨ Place a towel between your skin and the bag. °¨ Leave the ice on for 10 to 20 minutes every 2 hours, or as needed. °· Using heat packs for 30 minutes prior to activities is also helpful. °Continued back pain °See your caregiver if you have continued problems. Your caregiver can help or refer you for appropriate physical therapy. With conditioning, most back problems can be avoided. Sometimes, a more serious issue may be the cause of back pain. You should be seen right away if new problems seem to be developing. Your caregiver may recommend: °· A maternity girdle. °· An elastic sling. °· A back brace. °· A massage therapist or acupuncture. °SEEK MEDICAL CARE IF:  °· You are not able to do most of your daily activities, even when taking the pain medicine you were given. °· You need a referral to a physical therapist or chiropractor. °· You want to try acupuncture. °SEEK IMMEDIATE MEDICAL CARE  IF: °· You develop numbness, tingling, weakness, or problems with the use of your arms or legs. °· You develop severe back pain that is no longer relieved with medicines. °· You have a sudden change in bowel or bladder control. °· You have increasing pain in other areas of the body. °· You develop shortness of breath, dizziness, or fainting. °· You develop nausea, vomiting, or sweating. °· You have back pain which is similar to labor pains. °· You have back pain along with your water breaking or vaginal bleeding. °· You have back pain or numbness that travels down your leg. °· Your back pain developed after you fell. °· You develop pain on one side of your back. You may have a kidney stone. °· You see blood in your urine. You may have a bladder infection or kidney stone. °· You have back pain with blisters. You may have shingles. °Back pain is fairly common during pregnancy but should not be accepted as just part of   the process. Back pain should always be treated as soon as possible. This will make your pregnancy as pleasant as possible. °  °This information is not intended to replace advice given to you by your health care provider. Make sure you discuss any questions you have with your health care provider. °  °Document Released: 05/26/2005 Document Revised: 05/10/2011 Document Reviewed: 07/07/2010 °[employer]vier Interactive Patient Education ©2016 [employer] ° °Second Trimester of Pregnancy °The second trimester is from week 13 through week 28, months 4 through 6. The second trimester is often a time when you feel your best. Your body has also adjusted to being pregnant, and you begin to feel better physically. Usually, morning sickness has lessened or quit completely, you may have more energy, and you may have an increase in appetite. The second trimester is also a time when the fetus is growing rapidly. At the end of the sixth month, the fetus is about 9 inches long and weighs about 1½ pounds. You will likely  begin to feel the baby move (quickening) between 18 and 20 weeks of the pregnancy. °BODY CHANGES °Your body goes through many changes during pregnancy. The changes vary from woman to woman.  °· Your weight will continue to increase. You will notice your lower abdomen bulging out. °· You may begin to get stretch marks on your hips, abdomen, and breasts. °· You may develop headaches that can be relieved by medicines approved by your health care provider. °· You may urinate more often because the fetus is pressing on your bladder. °· You may develop or continue to have heartburn as a result of your pregnancy. °· You may develop constipation because certain hormones are causing the muscles that push waste through your intestines to slow down. °· You may develop hemorrhoids or swollen, bulging veins (varicose veins). °· You may have back pain because of the weight gain and pregnancy hormones relaxing your joints between the bones in your pelvis and as a result of a shift in weight and the muscles that support your balance. °· Your breasts will continue to grow and be tender. °· Your gums may bleed and may be sensitive to brushing and flossing. °· Dark spots or blotches (chloasma, mask of pregnancy) may develop on your face. This will likely fade after the baby is born. °· A dark line from your belly button to the pubic area (linea nigra) may appear. This will likely fade after the baby is born. °· You may have changes in your hair. These can include thickening of your hair, rapid growth, and changes in texture. Some women also have hair loss during or after pregnancy, or hair that feels dry or thin. Your hair will most likely return to normal after your baby is born. °WHAT TO EXPECT AT YOUR PRENATAL VISITS °During a routine prenatal visit: °· You will be weighed to make sure you and the fetus are growing normally. °· Your blood pressure will be taken. °· Your abdomen will be measured to track your baby's growth. °· The  fetal heartbeat will be listened to. °· Any test results from the previous visit will be discussed. °Your health care provider may ask you: °· How you are feeling. °· If you are feeling the baby move. °· If you have had any abnormal symptoms, such as leaking fluid, bleeding, severe headaches, or abdominal cramping. °· If you are using any tobacco products, including cigarettes, chewing tobacco, and electronic cigarettes. °· If you have any questions. °Other tests   that may be performed during your second trimester include: °· Blood tests that check for: °¨ Low iron levels (anemia). °¨ Gestational diabetes (between 24 and 28 weeks). °¨ Rh antibodies. °· Urine tests to check for infections, diabetes, or protein in the urine. °· An ultrasound to confirm the proper growth and development of the baby. °· An amniocentesis to check for possible genetic problems. °· Fetal screens for spina bifida and Down syndrome. °· HIV (human immunodeficiency virus) testing. Routine prenatal testing includes screening for HIV, unless you choose not to have this test. °HOME CARE INSTRUCTIONS  °· Avoid all smoking, herbs, alcohol, and unprescribed drugs. These chemicals affect the formation and growth of the baby. °· Do not use any tobacco products, including cigarettes, chewing tobacco, and electronic cigarettes. If you need help quitting, ask your health care provider. You may receive counseling support and other resources to help you quit. °· Follow your health care provider's instructions regarding medicine use. There are medicines that are either safe or unsafe to take during pregnancy. °· Exercise only as directed by your health care provider. Experiencing uterine cramps is a good sign to stop exercising. °· Continue to eat regular, healthy meals. °· Wear a good support bra for breast tenderness. °· Do not use hot tubs, steam rooms, or saunas. °· Wear your seat belt at all times when driving. °· Avoid raw meat, uncooked cheese, cat  litter boxes, and soil used by cats. These carry germs that can cause birth defects in the baby. °· Take your prenatal vitamins. °· Take 1500-2000 mg of calcium daily starting at the 20th week of pregnancy until you deliver your baby. °· Try taking a stool softener (if your health care provider approves) if you develop constipation. Eat more high-fiber foods, such as fresh vegetables or fruit and whole grains. Drink plenty of fluids to keep your urine clear or pale yellow. °· Take warm sitz baths to soothe any pain or discomfort caused by hemorrhoids. Use hemorrhoid cream if your health care provider approves. °· If you develop varicose veins, wear support hose. Elevate your feet for 15 minutes, 3-4 times a day. Limit salt in your diet. °· Avoid heavy lifting, wear low heel shoes, and practice good posture. °· Rest with your legs elevated if you have leg cramps or low back pain. °· Visit your dentist if you have not gone yet during your pregnancy. Use a soft toothbrush to brush your teeth and be gentle when you floss. °· A sexual relationship may be continued unless your health care provider directs you otherwise. °· Continue to go to all your prenatal visits as directed by your health care provider. °SEEK MEDICAL CARE IF:  °· You have dizziness. °· You have mild pelvic cramps, pelvic pressure, or nagging pain in the abdominal area. °· You have persistent nausea, vomiting, or diarrhea. °· You have a bad smelling vaginal discharge. °· You have pain with urination. °SEEK IMMEDIATE MEDICAL CARE IF:  °· You have a fever. °· You are leaking fluid from your vagina. °· You have spotting or bleeding from your vagina. °· You have severe abdominal cramping or pain. °· You have rapid weight gain or loss. °· You have shortness of breath with chest pain. °· You notice sudden or extreme swelling of your face, hands, ankles, feet, or legs. °· You have not felt your baby move in over an hour. °· You have severe headaches that do  not go away with medicine. °· You have vision   changes. °  °This information is not intended to replace advice given to you by your health care provider. Make sure you discuss any questions you have with your health care provider. °  °Document Released: 02/09/2001 Document Revised: 03/08/2014 Document Reviewed: 04/18/2012 °Elsevier Interactive Patient Education ©2016 [employer] ° °

## 2015-02-24 NOTE — MAU Provider Note (Signed)
History    Diane [name] is a 19y.o. G1P0 at 16.1wks who presents, unannounced, for cramping s/p fall.  Patient states around 2200 she feel and hit her back and shoulder on a chair, but did not hit her stomach.  Patient reports she started having cramps and which resulted in "a panic attack."  Patient reports history of anxiety and was on Lexapro prior to pregnancy, but nothing currently.  Patient states that she attempted to hydrate, but further states having "like 4 bottles of water today." Patient reports taking "  of acetaminophen" to attempt to resolve cramping without success and currently rates at 8/10. Denies issues with N/V, urination, constipation, or diarrhea.  Patient further denies abnormal vaginal discharge, VB, and LOF.  Patient endorses sexual intercourse in last 48 hours.   There are no active problems to display for this patient.   Chief Complaint  Patient presents with  . Abdominal Cramping  . Back Pain   HPI  OB History    Gravida Para Term Preterm AB TAB SAB Ectopic Multiple Living   1               Past Medical History  Diagnosis Date  . Anxiety   . Anemia   . Headache   . UTI (lower urinary tract infection)     History reviewed. No pertinent past surgical history.  History reviewed. No pertinent family history.  Social History  Substance Use Topics  . Smoking status: Never Smoker   . Smokeless tobacco: None  . Alcohol Use: No    Allergies:  Allergies  Allergen Reactions  . Cefzil [Cefprozil] Hives  . Rocephin [Ceftriaxone Sodium In Dextrose] Hives    Prescriptions prior to admission  Medication Sig Dispense Refill Last Dose  . acetaminophen (TYLENOL) 500 MG tablet Take 1,000 mg by mouth every 6 (six) hours as needed for moderate pain.   02/24/2015 at 2300  . hydrOXYzine (ATARAX/VISTARIL) 25 MG tablet Take 1 tablet (25 mg total) by mouth every 6 (six) hours. 12 tablet 0 More than a month at Unknown time  . naproxen (NAPROSYN) 500 MG tablet  Take 1 tablet (500 mg total) by mouth 2 (two) times daily. 30 tablet 0 More than a month at Unknown time  . norethindrone (MICRONOR,CAMILA,ERRIN) 0.35 MG tablet Take 1 tablet by mouth daily.   06/01/2014 at Unknown time  . silver sulfADIAZINE (SILVADENE) 1 % cream Apply 1 application topically daily. 50 g 2 More than a month at Unknown time  . traMADol (ULTRAM) 50 MG tablet Take 1 tablet (50 mg total) by mouth every 6 (six) hours as needed. 15 tablet 0 More than a month at Unknown time    ROS  See HPI Above Physical Exam   Blood pressure 110/62, pulse 102, temperature 98.2 F (36.8 C), temperature source Oral, resp. rate 17, height  (1.753 m), weight 73.573 kg (162 lb 3.2 oz), last menstrual period 06/01/2014, SpO2 99 %.  Results for orders placed or performed during the hospital encounter of 02/24/15 (from the past 24 hour(s))  Urinalysis, Routine w reflex microscopic (not at [facility])     Status: Abnormal   Collection Time: 02/24/15 12:22 AM  Result Value Ref Range   Color, Urine YELLOW YELLOW   APPearance CLEAR CLEAR   Specific Gravity, Urine >1.030 (H) 1.005 - 1.030   pH 6.0 5.0 - 8.0   Glucose, UA NEGATIVE NEGATIVE mg/dL   Hgb urine dipstick NEGATIVE NEGATIVE   Bilirubin Urine NEGATIVE NEGATIVE  Ketones, ur NEGATIVE NEGATIVE mg/dL   Protein, ur NEGATIVE NEGATIVE mg/dL   Nitrite NEGATIVE NEGATIVE   Leukocytes, UA NEGATIVE NEGATIVE    Physical Exam  Constitutional: She is oriented to person, place, and time. She appears well-developed and well-nourished. No distress.  HENT:  Head: Normocephalic and atraumatic.  Mouth/Throat: Mucous membranes are dry.  Eyes: Pupils are equal, round, and reactive to light.  Neck: Normal range of motion.  Cardiovascular: Normal rate, regular rhythm and normal heart sounds.   Respiratory: Effort normal and breath sounds normal.  GI: Soft. Bowel sounds are normal. There is tenderness. There is no guarding.  Musculoskeletal: Normal range of  motion. She exhibits no edema.  Neurological: She is alert and oriented to person, place, and time.  Skin: Skin is warm and dry.  Psychiatric: She has a normal mood and affect. Her behavior is normal.     FHR: 140 by doppler ED Course  Assessment: IUP at 16.1wks S/P Fall Anxiety  Plan: -Obtain UA -Ibuprofen 600mg  -Push PO fluids  Follow Up (0220) -Patient reports some relief from ibuprofen -Instructed on medication usage at home -Discussed dehydration and proper hydration during pregnancy +Minimal of 64 oz water daily -Educated on effects of dehydration on maternal/fetal status +Cramping +Decreased urine output -Expresses concern regarding inability to eat dairy and requests vitamin D testing/supplement +Informed that this can be addressed at next PNV -Keep appt as scheduled: 03/06/2014 -Patient declines medication for anxiety -Encouraged to call if any questions or concerns arise prior to next scheduled office visit.  -Discharged to home in improved condition  Cherre RobinsJessica L Ameliyah Sarno CNM, MSN 02/24/2015 1:16 AM

## 2015-03-02 NOTE — L&D Delivery Note (Addendum)
Delivery Note At 7:55 AM a viable female  "Diane [name]" was delivered via Vaginal, Spontaneous Delivery (Presentation: Right Occiput Anterior).  APGAR: 8, 9; weight  .    Placenta status: Intact, Spontaneous.  Cord: 3 vessels with the following complications: True Knot x2.  Cord pH: Fundus boggy after delivery, one dose of methergine given.   Anesthesia: Epidural  Episiotomy: Median Lacerations: 2nd degree Suture Repair: 3.0 vicryl Est. Blood Loss (mL): 350   Mom to postpartum.  Baby to Couplet care / Skin to Skin. Mom plan to breastfeed Outpatient circ   Alphonzo SeveranceRachel Lieutenant Abarca 08/12/2015, 8:57 AM

## 2015-03-05 ENCOUNTER — Ambulatory Visit: Payer: BLUE CROSS/BLUE SHIELD | Attending: Obstetrics and Gynecology

## 2015-03-05 DIAGNOSIS — O2692 Pregnancy related conditions, unspecified, second trimester: Secondary | ICD-10-CM | POA: Diagnosis not present

## 2015-03-05 DIAGNOSIS — O26892 Other specified pregnancy related conditions, second trimester: Secondary | ICD-10-CM

## 2015-03-05 DIAGNOSIS — M545 Low back pain: Secondary | ICD-10-CM | POA: Diagnosis present

## 2015-03-05 NOTE — Therapy (Addendum)
[facility] [address] [place], [place], [address] Phone: [phone]   Fax:  [phone]  Physical Therapy Evaluation  Patient Details  Name: Diane [name] MRN: [id number] Date of Birth: 10/26/1995 Referring Provider: Delma Officer , MD  Encounter Date: 03/05/2015      PT End of Session - 03/05/15 1000    Visit Number 1   Date for PT Re-Evaluation 04/30/15   PT Start Time 0933   PT Stop Time 0100   PT Time Calculation (min) 927 min   Activity Tolerance Patient tolerated treatment well  no treatment due to medicaid- will submit for visits   Behavior During Therapy [facility] for tasks assessed/performed      Past Medical History  Diagnosis Date  . Anxiety   . Anemia   . Headache   . UTI (lower urinary tract infection)     History reviewed. No pertinent past surgical history.  There were no vitals filed for this visit.  Visit Diagnosis:  Low back pain during pregnancy in second trimester - Plan: PT plan of care cert/re-cert      Subjective Assessment - 03/05/15 0937    Subjective Pt is a 20 y.o. female who presents to PT with LBP that began 12/2014.  Pt is [redacted] weeks pregnant now.   Pt was caring for her grandmother and had to help lift her when she was using the bathroom.  Pt was [redacted] weeks pregnant at this time.  Pt also had a fall 01/2015 landing on her back.   Pertinent History Pt is pregnant with first child.   Diagnostic tests none because pt is pregnant   Patient Stated Goals reduce LBP   Currently in Pain? Yes   Pain Score 2   up to 6/10 when it occurs   Pain Location Back   Pain Orientation Lower;Left;Right   Pain Descriptors / Indicators Sore   Pain Type Acute pain   Pain Onset More than a month ago   Pain Frequency Intermittent   Aggravating Factors  unknown to pt.  Sometimes wakes with the pain, sometimes walking   Pain Relieving Factors Tylenol            [facility] PT Assessment - 03/05/15  0001    Assessment   Medical Diagnosis LBP   Referring Provider carter, Sid Falcon , MD   Onset Date/Surgical Date 01/15/15   Next MD Visit 03/07/15   Prior Therapy none   Precautions   Precautions Other (comment)  pt is pregnant   Restrictions   Weight Bearing Restrictions No   Balance Screen   Has the patient fallen in the past 6 months Yes   How many times? 1  slipped on something- no balance deficits   Has the patient had a decrease in activity level because of a fear of falling?  No   Is the patient reluctant to leave their home because of a fear of falling?  No   Home Environment   Living Environment Private residence   Type of Home House   Prior Function   Level of [place] Unemployed   Leisure walking daily for a few minutes   Cognition   Overall Cognitive Status Within Functional Limits for tasks assessed   Observation/Other Assessments   Focus on Therapeutic Outcomes (FOTO)  34% limitation   Posture/Postural Control   Posture/Postural Control No significant limitations   ROM / Strength   AROM / PROM / Strength AROM;PROM;Strength  AROM   Overall AROM  Within functional limits for tasks performed   Overall AROM Comments Lumbar AROM is full.  Central lumbar pain with end range flexion and extension.  No pain with sidebending.   PROM   Overall PROM  Within functional limits for tasks performed   Overall PROM Comments no pain with hip PROM   Strength   Overall Strength Within functional limits for tasks performed   Overall Strength Comments 5/5 bilateral LE strength   Palpation   SI assessment  pelvis in alignment today and level   Palpation comment localized palpable tenderness over central lumbar paraspinals L3-5   Ambulation/Gait   Ambulation/Gait Yes   Ambulation/Gait Assistance 7: Independent                             PT Short Term Goals - 03/05/15 1004    PT SHORT TERM GOAL #1   Title be independent in  initial HEP   Time 4   Period Weeks   Status New   PT SHORT TERM GOAL #2   Title report 25% reduction in LBP with ADLs and self-care   Time 4   Period Weeks   Status New           PT Long Term Goals - 03/05/15 1004    PT LONG TERM GOAL #1   Title be independent in advanced HEP   Time 8   Period Weeks   Status New   PT LONG TERM GOAL #2   Title reduce FOTO to < or = to 25% limitation   Time 8   Period Weeks   Status New   PT LONG TERM GOAL #3   Title report a 60% reduction in LBP with ADLs and self-care   Time 8   Period Weeks   Status New   PT LONG TERM GOAL #4   Title demonstrate and verbalize understanding of correct body mechanics for safety with ADLs and self-care during pregnancy   Time 8   Period Weeks   Status New               Plan - 03/05/15 1000    Clinical Impression Statement Pt is a 20 y.o. female in her 2nd trimester of pregnancy who reports with LBP that began 12/2014.  Pt was helping care for her grandmother and needed to help her in the bathroom and strained her low back at that time.  Pt then had a fall in December landing on her back.  Pt demonstrates painful lumbar flexion and extension and has palpable tenderness over bilateral lower lumbar paraspinals.  FOTO score is 34% limitation.  Pt will benefit from skilled PT for lumbar/hip flexibility, body mechanics education, manual and core strength progression.     Pt will benefit from skilled therapeutic intervention in order to improve on the following deficits Pain;Decreased knowledge of precautions;Improper body mechanics   Rehab Potential Good   PT Frequency 2x / week   PT Duration 8 weeks   PT Treatment/Interventions ADLs/Self Care Home Management;Cryotherapy;Moist Heat;Therapeutic exercise;Therapeutic activities;Functional mobility training;Patient/family education;Manual techniques;Passive range of motion   PT Next Visit Plan body mechanics training for pregnancy and LBP, hip and lumbar  flexibility, core strength, manual as needed.   Consulted and Agree with Plan of Care Patient         Problem List There are no active problems to display for this patient.   TAKACS,KELLY, PT  03/05/2015, 10:07 AM PHYSICAL THERAPY DISCHARGE SUMMARY  Visits from Start of Care: 1  Current functional level related to goals / functional outcomes: Pt attended evaluation and didn't attend after this session.     Remaining deficits: Unknown as pt didn't return to PT.     Education / Equipment: HEP  Plan: Patient agrees to discharge.  Patient goals were not met. Patient is being discharged due to not returning since the last visit.  ?????   Sigurd Sos, PT 04/28/2015 2:59 PM   [facility] [address], [place], [place], [address] Phone: [phone]   Fax:  [phone]  Name: Diane [name] MRN: [id number] Date of Birth: 1995-07-03

## 2015-03-29 ENCOUNTER — Encounter (HOSPITAL_COMMUNITY): Payer: Self-pay | Admitting: *Deleted

## 2015-03-29 ENCOUNTER — Inpatient Hospital Stay (HOSPITAL_COMMUNITY): Payer: BLUE CROSS/BLUE SHIELD

## 2015-03-29 ENCOUNTER — Inpatient Hospital Stay (HOSPITAL_COMMUNITY)
Admission: AD | Admit: 2015-03-29 | Discharge: 2015-03-29 | Disposition: A | Discharge status: Home / Self Care | Payer: BLUE CROSS/BLUE SHIELD | Source: Ambulatory Visit | Attending: Obstetrics & Gynecology | Admitting: Obstetrics & Gynecology

## 2015-03-29 DIAGNOSIS — F32A Depression, unspecified: Secondary | ICD-10-CM | POA: Diagnosis not present

## 2015-03-29 DIAGNOSIS — Z881 Allergy status to other antibiotic agents status: Secondary | ICD-10-CM

## 2015-03-29 DIAGNOSIS — O26852 Spotting complicating pregnancy, second trimester: Secondary | ICD-10-CM | POA: Diagnosis present

## 2015-03-29 DIAGNOSIS — Z6791 Unspecified blood type, Rh negative: Secondary | ICD-10-CM | POA: Diagnosis present

## 2015-03-29 DIAGNOSIS — O26892 Other specified pregnancy related conditions, second trimester: Secondary | ICD-10-CM | POA: Insufficient documentation

## 2015-03-29 DIAGNOSIS — Z3A20 20 weeks gestation of pregnancy: Secondary | ICD-10-CM | POA: Insufficient documentation

## 2015-03-29 DIAGNOSIS — F329 Major depressive disorder, single episode, unspecified: Secondary | ICD-10-CM | POA: Insufficient documentation

## 2015-03-29 DIAGNOSIS — O99342 Other mental disorders complicating pregnancy, second trimester: Secondary | ICD-10-CM | POA: Diagnosis not present

## 2015-03-29 DIAGNOSIS — F419 Anxiety disorder, unspecified: Secondary | ICD-10-CM | POA: Diagnosis not present

## 2015-03-29 DIAGNOSIS — O26899 Other specified pregnancy related conditions, unspecified trimester: Secondary | ICD-10-CM | POA: Diagnosis present

## 2015-03-29 DIAGNOSIS — O99340 Other mental disorders complicating pregnancy, unspecified trimester: Secondary | ICD-10-CM | POA: Diagnosis not present

## 2015-03-29 LAB — URINALYSIS, ROUTINE W REFLEX MICROSCOPIC
BILIRUBIN URINE: NEGATIVE
GLUCOSE, UA: NEGATIVE mg/dL
Ketones, ur: NEGATIVE mg/dL
LEUKOCYTES UA: NEGATIVE
Nitrite: NEGATIVE
PH: 6 (ref 5.0–8.0)
Protein, ur: NEGATIVE mg/dL
Specific Gravity, Urine: 1.02 (ref 1.005–1.030)

## 2015-03-29 LAB — ABO/RH: ABO/RH(D): B NEG

## 2015-03-29 LAB — WET PREP, GENITAL
Clue Cells Wet Prep HPF POC: NONE SEEN
SPERM: NONE SEEN
Trich, Wet Prep: NONE SEEN
YEAST WET PREP: NONE SEEN

## 2015-03-29 LAB — URINE MICROSCOPIC-ADD ON

## 2015-03-29 MED ORDER — RHO D IMMUNE GLOBULIN 1500 UNIT/2ML IJ SOSY
300.0000 ug | PREFILLED_SYRINGE | Freq: Once | INTRAMUSCULAR | Status: AC
  Administered 2015-03-29: 300 ug via INTRAMUSCULAR
  Filled 2015-03-29: qty 2

## 2015-03-29 NOTE — MAU Provider Note (Signed)
History   20 yo G1P0 at 77 6/7 weeks presented after calling to report bright red spotting noted in underwear this am after awakening.  Denies recent IC or any trauma.  Had anatomy [facility] 1/23, with anterior placenta and cervix 4.4 cm long, limited anatomy.  Has f/u US scheduled   Patient Active Problem List   Diagnosis Date Noted  . Anxiety 03/29/2015  . Rh negative state in antepartum period 03/29/2015  . 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 Spotting affecting pregnancy in second trimester 03/29/2015  . 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 Abdominal Cramping  . Vaginal Bleeding   HPI:  See above  OB History    Gravida Para Term Preterm AB TAB SAB Ectopic Multiple Living   1               Past Medical History  Diagnosis Date  . Anxiety   . Anemia   . Headache   . UTI (lower urinary tract infection)     History reviewed. No pertinent past surgical history.  No family history on file.  Social History  Substance Use Topics  . Smoking status: Never Smoker   . Smokeless tobacco: None  . Alcohol Use: No    Allergies:  Allergies  Allergen Reactions  . Cefzil [Cefprozil] Hives  . Rocephin [Ceftriaxone Sodium In Dextrose] Hives    Prescriptions prior to admission  Medication Sig Dispense Refill Last Dose  . acetaminophen (TYLENOL) 500 MG tablet Take 1,000 mg by mouth every 6 (six) hours as needed for moderate pain.   03/28/2015 at Unknown time  . Prenatal Vit-Fe Fumarate-FA (PRENATAL MULTIVITAMIN) TABS tablet Take 1 tablet by mouth daily at 12 noon.   03/28/2015 at Unknown time  . sertraline (ZOLOFT) 25 MG tablet Take 25 mg by mouth daily.   03/28/2015 at Unknown time  . hydrOXYzine (ATARAX/VISTARIL) 25 MG tablet Take 1 tablet (25 mg total) by mouth every 6 (six) hours. (Patient not taking: Reported on 03/29/2015) 12 tablet 0 More than a month at Unknown time  .  silver sulfADIAZINE (SILVADENE) 1 % cream Apply 1 application topically daily. (Patient not taking: Reported on 03/29/2015) 50 g 2 More than a month at Unknown time  . traMADol (ULTRAM) 50 MG tablet Take 1 tablet (50 mg total) by mouth every 6 (six) hours as needed. (Patient not taking: Reported on 03/29/2015) 15 tablet 0 More than a month at Unknown time    ROS:  BRB/spotting this am, + FM, mild cramping. Physical Exam   Blood pressure 123/71, pulse 96, temperature 97.9 F (36.6 C), temperature source Oral, resp. rate 18, height 5' 8.5" (1.74 m), weight 75.025 kg (165 lb 6.4 oz), last menstrual period 06/01/2014.    Physical Exam  In NAD Chest clear Heart RRR without murmur Abd gravid, NT Pelvic--moderate white d/c in vault, no obvious bleeding noted, cervix non-friable, closed, long, firm. Ext WNL  FHR 145 by doppler No UCs palpated  ED Course  Assessment: IUP at 20 6/7 weeks Episode of BRB/spotting today. Rh negative  Plan: Wet prep Limited OB US for evaluation of placenta and cervical length. If WNL, will d/c home on pelvic rest.   Nigel Bridgeman CNM, MSN 03/29/2015 11:48 AM  Addendum: Returned from US--Cervix 4.1 cm long, normal fluid, placenta WNL and anterior, vtx.  Results for orders placed or performed during the hospital encounter of 03/29/15 (from the past  24 hour(s))  Urinalysis, Routine w reflex microscopic (not at [facility])     Status: Abnormal   Collection Time: 03/29/15 10:55 AM  Result Value Ref Range   Color, Urine YELLOW YELLOW   APPearance CLEAR CLEAR   Specific Gravity, Urine 1.020 1.005 - 1.030   pH 6.0 5.0 - 8.0   Glucose, UA NEGATIVE NEGATIVE mg/dL   Hgb urine dipstick TRACE (A) NEGATIVE   Bilirubin Urine NEGATIVE NEGATIVE   Ketones, ur NEGATIVE NEGATIVE mg/dL   Protein, ur NEGATIVE NEGATIVE mg/dL   Nitrite NEGATIVE NEGATIVE   Leukocytes, UA NEGATIVE NEGATIVE  Urine microscopic-add on     Status: Abnormal   Collection Time: 03/29/15 10:55 AM   Result Value Ref Range   Squamous Epithelial / LPF 0-5 (A) NONE SEEN   WBC, UA 0-5 0 - 5 WBC/hpf   RBC / HPF 0-5 0 - 5 RBC/hpf   Bacteria, UA FEW (A) NONE SEEN  Wet prep, genital     Status: Abnormal   Collection Time: 03/29/15 11:25 AM  Result Value Ref Range   Yeast Wet Prep HPF POC NONE SEEN NONE SEEN   Trich, Wet Prep NONE SEEN NONE SEEN   Clue Cells Wet Prep HPF POC NONE SEEN NONE SEEN   WBC, Wet Prep HPF POC FEW (A) NONE SEEN   Sperm NONE SEEN   Rh IG workup (includes ABO/Rh)     Status: None (Preliminary result)   Collection Time: 03/29/15 11:28 AM  Result Value Ref Range   Gestational Age(Wks) 20.6    ABO/RH(D) B NEG    Antibody Screen NEG    Fetal Screen NEG    Unit Number [id number]    Blood Component Type RHIG    Unit division 00    Status of Unit ISSUED    Transfusion Status OK TO TRANSFUSE   ABO/Rh     Status: None   Collection Time: 03/29/15 11:28 AM  Result Value Ref Range   ABO/RH(D) B NEG    Received Rhophylac at 1336  No further evidence of bleeding.  Plan: D/C home on pelvic rest. To f/u as scheduled, or with any further occurrences of bleeding or pain. Tylenol or Ibuprophen if needed for cramping.  Ray Church 03/29/15 1:51p

## 2015-03-29 NOTE — Discharge Instructions (Signed)
Vaginal Bleeding During Pregnancy, Second Trimester A small amount of bleeding (spotting) from the vagina is relatively common in pregnancy. It usually stops on its own. Various things can cause bleeding or spotting in pregnancy. Some bleeding may be related to the pregnancy, and some may not. Sometimes the bleeding is normal and is not a problem. However, bleeding can also be a sign of something serious. Be sure to tell your health care provider about any vaginal bleeding right away. Some possible causes of vaginal bleeding during the second trimester include:  Infection, inflammation, or growths on the cervix.   The placenta may be partially or completely covering the opening of the cervix inside the uterus (placenta previa).  The placenta may have separated from the uterus (abruption of the placenta).   You may be having early (preterm) labor.   The cervix may not be strong enough to keep a baby inside the uterus (cervical insufficiency).   Tiny cysts may have developed in the uterus instead of pregnancy tissue (molar pregnancy). HOME CARE INSTRUCTIONS  Watch your condition for any changes. The following actions may help to lessen any discomfort you are feeling:  Follow your health care provider's instructions for limiting your activity. If your health care provider orders bed rest, you may need to stay in bed and only get up to use the bathroom. However, your health care provider may allow you to continue light activity.  If needed, make plans for someone to help with your regular activities and responsibilities while you are on bed rest.  Keep track of the number of pads you use each day, how often you change pads, and how soaked (saturated) they are. Write this down.  Do not use tampons. Do not douche.  Do not have sexual intercourse or orgasms until approved by your health care provider.  If you pass any tissue from your vagina, save the tissue so you can show it to your  health care provider.  Only take over-the-counter or prescription medicines as directed by your health care provider.  Do not take aspirin because it can make you bleed.  Do not exercise or perform any strenuous activities or heavy lifting without your health care provider's permission.  Keep all follow-up appointments as directed by your health care provider. SEEK MEDICAL CARE IF:  You have any vaginal bleeding during any part of your pregnancy.  You have cramps or labor pains.  You have a fever, not controlled by medicine. SEEK IMMEDIATE MEDICAL CARE IF:   You have severe cramps in your back or belly (abdomen).  You have contractions.  You have chills.  You pass large clots or tissue from your vagina.  Your bleeding increases.  You feel light-headed or weak, or you have fainting episodes.  You are leaking fluid or have a gush of fluid from your vagina. MAKE SURE YOU:  Understand these instructions.  Will watch your condition.  Will get help right away if you are not doing well or get worse.   This information is not intended to replace advice given to you by your health care provider. Make sure you discuss any questions you have with your health care provider.   Document Released: 11/25/2004 Document Revised: 02/20/2013 Document Reviewed: 10/23/2012 Elsevier Interactive Patient Education 2016 [employer]vier Inc[employer]  

## 2015-03-29 NOTE — MAU Note (Signed)
Went to the bathroom earlier noticed bright red spotting, not wearing a pad, mild cramping 2/10.

## 2015-03-30 LAB — RH IG WORKUP (INCLUDES ABO/RH)
ABO/RH(D): B NEG
ANTIBODY SCREEN: NEGATIVE
Fetal Screen: NEGATIVE
Gestational Age(Wks): 20.6
Unit division: 0

## 2015-06-14 ENCOUNTER — Encounter (HOSPITAL_COMMUNITY): Payer: Self-pay

## 2015-06-14 ENCOUNTER — Inpatient Hospital Stay (HOSPITAL_COMMUNITY)
Admission: AD | Admit: 2015-06-14 | Discharge: 2015-06-14 | Disposition: A | Discharge status: Home / Self Care | Payer: BLUE CROSS/BLUE SHIELD | Source: Ambulatory Visit | Attending: Obstetrics and Gynecology | Admitting: Obstetrics and Gynecology

## 2015-06-14 DIAGNOSIS — O99343 Other mental disorders complicating pregnancy, third trimester: Secondary | ICD-10-CM | POA: Insufficient documentation

## 2015-06-14 DIAGNOSIS — F410 Panic disorder [episodic paroxysmal anxiety] without agoraphobia: Secondary | ICD-10-CM | POA: Diagnosis not present

## 2015-06-14 DIAGNOSIS — R55 Syncope and collapse: Secondary | ICD-10-CM | POA: Diagnosis not present

## 2015-06-14 DIAGNOSIS — R110 Nausea: Secondary | ICD-10-CM | POA: Insufficient documentation

## 2015-06-14 DIAGNOSIS — R51 Headache: Secondary | ICD-10-CM | POA: Insufficient documentation

## 2015-06-14 DIAGNOSIS — Z3A31 31 weeks gestation of pregnancy: Secondary | ICD-10-CM | POA: Diagnosis not present

## 2015-06-14 DIAGNOSIS — F419 Anxiety disorder, unspecified: Secondary | ICD-10-CM | POA: Insufficient documentation

## 2015-06-14 DIAGNOSIS — O26893 Other specified pregnancy related conditions, third trimester: Secondary | ICD-10-CM | POA: Diagnosis not present

## 2015-06-14 LAB — CBC WITH DIFFERENTIAL/PLATELET
Basophils Absolute: 0 10*3/uL (ref 0.0–0.1)
Basophils Relative: 0 %
Eosinophils Absolute: 0 10*3/uL (ref 0.0–0.7)
Eosinophils Relative: 1 %
HEMATOCRIT: 31.4 % — AB (ref 36.0–46.0)
HEMOGLOBIN: 10.8 g/dL — AB (ref 12.0–15.0)
LYMPHS ABS: 1.6 10*3/uL (ref 0.7–4.0)
LYMPHS PCT: 21 %
MCH: 31.4 pg (ref 26.0–34.0)
MCHC: 34.4 g/dL (ref 30.0–36.0)
MCV: 91.3 fL (ref 78.0–100.0)
MONOS PCT: 6 %
Monocytes Absolute: 0.4 10*3/uL (ref 0.1–1.0)
NEUTROS ABS: 5.6 10*3/uL (ref 1.7–7.7)
NEUTROS PCT: 72 %
Platelets: 192 10*3/uL (ref 150–400)
RBC: 3.44 MIL/uL — ABNORMAL LOW (ref 3.87–5.11)
RDW: 12.7 % (ref 11.5–15.5)
WBC: 7.7 10*3/uL (ref 4.0–10.5)

## 2015-06-14 LAB — URINALYSIS, ROUTINE W REFLEX MICROSCOPIC
Bilirubin Urine: NEGATIVE
GLUCOSE, UA: NEGATIVE mg/dL
HGB URINE DIPSTICK: NEGATIVE
KETONES UR: 40 mg/dL — AB
LEUKOCYTES UA: NEGATIVE
NITRITE: NEGATIVE
PH: 7.5 (ref 5.0–8.0)
Protein, ur: NEGATIVE mg/dL
Specific Gravity, Urine: 1.015 (ref 1.005–1.030)

## 2015-06-14 LAB — COMPREHENSIVE METABOLIC PANEL
ALK PHOS: 93 U/L (ref 38–126)
ALT: 11 U/L — AB (ref 14–54)
ANION GAP: 7 (ref 5–15)
AST: 21 U/L (ref 15–41)
Albumin: 3.3 g/dL — ABNORMAL LOW (ref 3.5–5.0)
BILIRUBIN TOTAL: 1.6 mg/dL — AB (ref 0.3–1.2)
BUN: 6 mg/dL (ref 6–20)
CO2: 23 mmol/L (ref 22–32)
CREATININE: 0.51 mg/dL (ref 0.44–1.00)
Calcium: 8.9 mg/dL (ref 8.9–10.3)
Chloride: 105 mmol/L (ref 101–111)
GFR calc non Af Amer: >60 mL/min (ref >60)
Glucose, Bld: 78 mg/dL (ref 65–99)
Potassium: 3.4 mmol/L — ABNORMAL LOW (ref 3.5–5.1)
Sodium: 135 mmol/L (ref 135–145)
TOTAL PROTEIN: 7.3 g/dL (ref 6.5–8.1)

## 2015-06-14 MED ORDER — LACTATED RINGERS IV BOLUS (SEPSIS)
500.0000 mL | Freq: Once | INTRAVENOUS | Status: AC
  Administered 2015-06-14: 500 mL via INTRAVENOUS

## 2015-06-14 NOTE — Discharge Instructions (Signed)

## 2015-06-14 NOTE — MAU Provider Note (Signed)
Diane KillianJalisa [name] is a 20 y.o. G1P0 at 31.6 weeks arrive EMS c/o LOC.  She has a hx of anxiety and depression on Zoloft 25mg  qd, state that it makes her sleepy.  She re-accounts that at 4:30pm she was in the shower, she got out and started cleaning and does not remember anything after that.  Her mother and boyfriend reports that she was awake, crying uncontrollably but not responding to them and he called EMS. The mother reports that after EMS arrived she had an episode of LOC.  Her speak is currently slow and slurred.  She state that often happen after an anxiety episode.  Her mother state EMS said her VSS and cbg was 86.  She also says she has a hx of anemia and a fHx of diabetes.  The family denies fell that she fell and/or hit any part of her body when she had a LOC.  The pt states this happen every 4-5 months, her mother report the last big episode was the summer of 2016 and she was take to [facility]. [facility] notes from August of 2016 Dx as a panic attack ans she was given IM Vistaril.  She says when she arrived she had a slight HA and nausea that has resolved.   She denies vb or lof with occasional ctx and +FM.  Pt has recent hx of BH.    History     Patient Active Problem List   Diagnosis Date Noted  . Anxiety 03/29/2015  . Rh negative state in antepartum period 03/29/2015  . 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 Spotting affecting pregnancy in second trimester 03/29/2015  . 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 Panic Attack   HPI  OB History    Gravida Para Term Preterm AB TAB SAB Ectopic Multiple Living   1               Past Medical History  Diagnosis Date  . Anxiety   . Anemia   . Headache   . UTI (lower urinary tract infection)     History reviewed. No pertinent past surgical history.  History reviewed. No pertinent family history.  Social History  Substance Use Topics  .  Smoking status: Never Smoker   . Smokeless tobacco: None  . Alcohol Use: No    Allergies:  Allergies  Allergen Reactions  . Cefzil [Cefprozil] Hives  . Rocephin [Ceftriaxone Sodium In Dextrose] Hives    Prescriptions prior to admission  Medication Sig Dispense Refill Last Dose  . acetaminophen (TYLENOL) 500 MG tablet Take 1,000 mg by mouth every 6 (six) hours as needed for moderate pain.   Past Month at Unknown time  . Prenatal Vit-Fe Fumarate-FA (PRENATAL MULTIVITAMIN) TABS tablet Take 1 tablet by mouth daily at 12 noon.   06/13/2015 at Unknown time  . sertraline (ZOLOFT) 25 MG tablet Take 25 mg by mouth daily.   06/13/2015 at Unknown time  . hydrOXYzine (ATARAX/VISTARIL) 25 MG tablet Take 1 tablet (25 mg total) by mouth every 6 (six) hours. (Patient not taking: Reported on 03/29/2015) 12 tablet 0 More than a month at Unknown time    ROS See HPI above, all other systems are negative  Physical Exam   Pulse 108, temperature 98.7 F (37.1 C), temperature source Oral, resp. rate 16, last menstrual period 06/01/2014, SpO2 98 %.  Physical Exam Ext:  WNL ABD: Soft, non tender to palpation, no  rebound or guarding SVE:C/T/H   ED Course  Assessment: IUP at  31.6 weeks Membranes: intact FHR: Category 1 CTX:  3-5 minutes, pts feel lightly Skin warm dry non tenting   Plan: Labs: CBC, CMP IVF Zofran PRN EKG Neurological exam Consult with Dr. Chip Boer Mariadel Mruk, CNM, MSN 06/14/2015. 8:03 PM   MAU Addendum Note EKG  - normal sinus rhythm, normal ECG Neuro exam WNL Speak pattern WNL   Results for orders placed or performed during the hospital encounter of 06/14/15 (from the past 24 hour(s))  CBC with Differential/Platelet     Status: Abnormal   Collection Time: 06/14/15  8:10 PM  Result Value Ref Range   WBC 7.7 4.0 - 10.5 K/uL   RBC 3.44 (L) 3.87 - 5.11 MIL/uL   Hemoglobin 10.8 (L) 12.0 - 15.0 g/dL   HCT 95.2 (L) 84.1 - 32.4 %   MCV 91.3 78.0 - 100.0 fL    MCH 31.4 26.0 - 34.0 pg   MCHC 34.4 30.0 - 36.0 g/dL   RDW 40.1 02.7 - 25.3 %   Platelets 192 150 - 400 K/uL   Neutrophils Relative % 72 %   Neutro Abs 5.6 1.7 - 7.7 K/uL   Lymphocytes Relative 21 %   Lymphs Abs 1.6 0.7 - 4.0 K/uL   Monocytes Relative 6 %   Monocytes Absolute 0.4 0.1 - 1.0 K/uL   Eosinophils Relative 1 %   Eosinophils Absolute 0.0 0.0 - 0.7 K/uL   Basophils Relative 0 %   Basophils Absolute 0.0 0.0 - 0.1 K/uL  Comprehensive metabolic panel     Status: Abnormal   Collection Time: 06/14/15  8:10 PM  Result Value Ref Range   Sodium 135 135 - 145 mmol/L   Potassium 3.4 (L) 3.5 - 5.1 mmol/L   Chloride 105 101 - 111 mmol/L   CO2 23 22 - 32 mmol/L   Glucose, Bld 78 65 - 99 mg/dL   BUN 6 6 - 20 mg/dL   Creatinine, Ser 6.64 0.44 - 1.00 mg/dL   Calcium 8.9 8.9 - 40.3 mg/dL   Total Protein 7.3 6.5 - 8.1 g/dL   Albumin 3.3 (L) 3.5 - 5.0 g/dL   AST 21 15 - 41 U/L   ALT 11 (L) 14 - 54 U/L   Alkaline Phosphatase 93 38 - 126 U/L   Total Bilirubin 1.6 (H) 0.3 - 1.2 mg/dL   GFR calc non Af Amer >60 >60 mL/min   GFR calc Af Amer >60 >60 mL/min   Anion gap 7 5 - 15  Urinalysis, Routine w reflex microscopic (not at [facility])     Status: Abnormal   Collection Time: 06/14/15  8:10 PM  Result Value Ref Range   Color, Urine YELLOW YELLOW   APPearance CLEAR CLEAR   Specific Gravity, Urine 1.015 1.005 - 1.030   pH 7.5 5.0 - 8.0   Glucose, UA NEGATIVE NEGATIVE mg/dL   Hgb urine dipstick NEGATIVE NEGATIVE   Bilirubin Urine NEGATIVE NEGATIVE   Ketones, ur 40 (A) NEGATIVE mg/dL   Protein, ur NEGATIVE NEGATIVE mg/dL   Nitrite NEGATIVE NEGATIVE   Leukocytes, UA NEGATIVE NEGATIVE     Plan: -Discussed need to follow up in office  -Bleeding and PTL Precautions -Encouraged to call if any questions or concerns arise prior to next scheduled office visit on Tuesday  -Discharged to home in stable condition -Referral for neuro consult Consulted with Dr. Elouise Munroe Crislyn Willbanks, CNM,  MSN 06/14/2015.  10:27 PM  

## 2015-06-14 NOTE — Progress Notes (Signed)
Pt's mother and boyfriend arrived.  Boyfriend states pt was lying on bed, crying, she grabbed her stomach like she was hurting but wouldn't speak to him.  Boyfriend states she did not fall.  Mother reports with some panic attacks in past, pt has passed out.

## 2015-06-14 NOTE — MAU Note (Signed)
Received from EMS.  Got out of shower at 4 pm.  Was asked by boyfriend to help clean up their room and started cleaning and then that is all I remember. Then waking up in the ambulance.  No vag bleeding. No leaking.  Feels nauseated and headache starting.  Denies problems with vision. Felt 3 contractions with EMS, denies any now. Baby moving well.

## 2015-07-01 LAB — OB RESULTS CONSOLE GBS: GBS: POSITIVE

## 2015-08-11 ENCOUNTER — Encounter (HOSPITAL_COMMUNITY): Payer: Self-pay

## 2015-08-11 ENCOUNTER — Inpatient Hospital Stay (HOSPITAL_COMMUNITY)
Admission: AD | Admit: 2015-08-11 | Discharge: 2015-08-14 | DRG: 775 | Disposition: A | Discharge status: Home / Self Care | Payer: BLUE CROSS/BLUE SHIELD | Source: Ambulatory Visit | Attending: Obstetrics and Gynecology | Admitting: Obstetrics and Gynecology

## 2015-08-11 DIAGNOSIS — O26899 Other specified pregnancy related conditions, unspecified trimester: Secondary | ICD-10-CM | POA: Diagnosis present

## 2015-08-11 DIAGNOSIS — O99824 Streptococcus B carrier state complicating childbirth: Secondary | ICD-10-CM | POA: Diagnosis present

## 2015-08-11 DIAGNOSIS — F32A Depression, unspecified: Secondary | ICD-10-CM | POA: Diagnosis present

## 2015-08-11 DIAGNOSIS — F419 Anxiety disorder, unspecified: Secondary | ICD-10-CM | POA: Diagnosis present

## 2015-08-11 DIAGNOSIS — O480 Post-term pregnancy: Principal | ICD-10-CM | POA: Diagnosis present

## 2015-08-11 DIAGNOSIS — Z3A40 40 weeks gestation of pregnancy: Secondary | ICD-10-CM

## 2015-08-11 DIAGNOSIS — O99344 Other mental disorders complicating childbirth: Secondary | ICD-10-CM | POA: Diagnosis present

## 2015-08-11 DIAGNOSIS — O99340 Other mental disorders complicating pregnancy, unspecified trimester: Secondary | ICD-10-CM | POA: Diagnosis present

## 2015-08-11 DIAGNOSIS — O26893 Other specified pregnancy related conditions, third trimester: Secondary | ICD-10-CM | POA: Diagnosis present

## 2015-08-11 DIAGNOSIS — Z881 Allergy status to other antibiotic agents status: Secondary | ICD-10-CM

## 2015-08-11 DIAGNOSIS — Z22330 Carrier of Group B streptococcus: Secondary | ICD-10-CM

## 2015-08-11 DIAGNOSIS — F418 Other specified anxiety disorders: Secondary | ICD-10-CM | POA: Diagnosis present

## 2015-08-11 DIAGNOSIS — Z6791 Unspecified blood type, Rh negative: Secondary | ICD-10-CM

## 2015-08-11 DIAGNOSIS — F329 Major depressive disorder, single episode, unspecified: Secondary | ICD-10-CM | POA: Diagnosis present

## 2015-08-11 LAB — CBC
HCT: 30.4 % — ABNORMAL LOW (ref 36.0–46.0)
Hemoglobin: 10.3 g/dL — ABNORMAL LOW (ref 12.0–15.0)
MCH: 30.5 pg (ref 26.0–34.0)
MCHC: 33.9 g/dL (ref 30.0–36.0)
MCV: 89.9 fL (ref 78.0–100.0)
PLATELETS: 250 10*3/uL (ref 150–400)
RBC: 3.38 MIL/uL — ABNORMAL LOW (ref 3.87–5.11)
RDW: 13.4 % (ref 11.5–15.5)
WBC: 9.3 10*3/uL (ref 4.0–10.5)

## 2015-08-11 LAB — TYPE AND SCREEN
ABO/RH(D): B NEG
ANTIBODY SCREEN: NEGATIVE

## 2015-08-11 MED ORDER — OXYCODONE-ACETAMINOPHEN 5-325 MG PO TABS: 2.0000 | ORAL_TABLET | ORAL | Status: DC | PRN

## 2015-08-11 MED ORDER — CLINDAMYCIN PHOSPHATE 900 MG/50ML IV SOLN
900.0000 mg | Freq: Three times a day (TID) | INTRAVENOUS | Status: DC
  Administered 2015-08-12: 900 mg via INTRAVENOUS
  Filled 2015-08-11 (×3): qty 50

## 2015-08-11 MED ORDER — DEXTROSE IN LACTATED RINGERS 5 % IV SOLN
INTRAVENOUS | Status: DC
  Administered 2015-08-11: 23:00:00 via INTRAVENOUS

## 2015-08-11 MED ORDER — OXYCODONE-ACETAMINOPHEN 5-325 MG PO TABS: 1.0000 | ORAL_TABLET | ORAL | Status: DC | PRN

## 2015-08-11 MED ORDER — LACTATED RINGERS IV SOLN: INTRAVENOUS | Status: DC

## 2015-08-11 MED ORDER — FLEET ENEMA 7-19 GM/118ML RE ENEM
1.0000 | ENEMA | RECTAL | Status: DC | PRN
Start: 2015-08-11 — End: 2015-08-12

## 2015-08-11 MED ORDER — OXYTOCIN 40 UNITS IN LACTATED RINGERS INFUSION - SIMPLE MED
2.5000 [IU]/h | INTRAVENOUS | Status: DC
  Filled 2015-08-11: qty 1000

## 2015-08-11 MED ORDER — ACETAMINOPHEN 325 MG PO TABS: 650.0000 mg | ORAL_TABLET | ORAL | Status: DC | PRN

## 2015-08-11 MED ORDER — SOD CITRATE-CITRIC ACID 500-334 MG/5ML PO SOLN: 30.0000 mL | ORAL | Status: DC | PRN

## 2015-08-11 MED ORDER — NALBUPHINE HCL 10 MG/ML IJ SOLN: 5.0000 mg | INTRAMUSCULAR | Status: DC | PRN

## 2015-08-11 MED ORDER — LACTATED RINGERS IV SOLN
500.0000 mL | INTRAVENOUS | Status: DC | PRN
  Administered 2015-08-12: 500 mL via INTRAVENOUS

## 2015-08-11 MED ORDER — OXYTOCIN BOLUS FROM INFUSION
500.0000 mL | INTRAVENOUS | Status: DC
  Administered 2015-08-12: 500 mL via INTRAVENOUS

## 2015-08-11 MED ORDER — ONDANSETRON HCL 4 MG/2ML IJ SOLN
4.0000 mg | Freq: Four times a day (QID) | INTRAMUSCULAR | Status: DC | PRN
  Administered 2015-08-12: 4 mg via INTRAVENOUS
  Filled 2015-08-11: qty 2

## 2015-08-11 MED ORDER — LIDOCAINE HCL (PF) 1 % IJ SOLN
30.0000 mL | INTRAMUSCULAR | Status: AC | PRN
  Administered 2015-08-12: 30 mL via SUBCUTANEOUS
  Filled 2015-08-11: qty 30

## 2015-08-11 MED ORDER — LACTATED RINGERS IV BOLUS (SEPSIS)
1000.0000 mL | Freq: Once | INTRAVENOUS | Status: AC
  Administered 2015-08-11: 1000 mL via INTRAVENOUS

## 2015-08-11 NOTE — H&P (Signed)
Diane [name] is a 20 y.o. female, G1P0 at 40.1 weeks, presenting for contractions that have increased in intensity and frequency.  Contractions began earlier this morning around 8am with a frequency of 8-7710min and increased to 3-5 min lasting one minute at 2100. Report active fetus, some vaginal bleeding and no lof.     Using a birth plan  Desires waterbirth with use of a Malvin JohnsDoula, Blythe Reavis  Patient Active Problem List   Diagnosis Date Noted  . 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 Post-dates pregnancy 08/11/2015  . Anxiety 03/29/2015  . Rh negative state in antepartum period 03/29/2015  . 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 Spotting affecting pregnancy in second trimester 03/29/2015  . 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   EDC of 08/10/15 was established by LMP on 11/03/15.   Anatomy scan:  20.1 weeks on  03/24/15, with normal findings and an anterior  placenta.    EFW 378g, good interval growth, female and appears normal. Need F/U bladder, nose, cardiac and lips. Additional US evaluations:  13.5 wks  On 02/07/16:  TA images obtained. Singleton pregnancy. Anteverted uterus. NT=1.376mm seen.  Ovaries/adnexas wnl 20.6 wks on 03/29/15: cervical length and placenta location--cervix 4.1 cm, placenta anterior, placental lake vs small  marginal hemorrhage. 23.1 wks  F/u anatomy  On 04/14/15: ALL WNL. CERVIX 4.9. EFW 1+8, 70%ILE, NORMAL FLUID, POSSIBLE SMALL  HEMORRHAGE SEEN PREVIOUSLY, 1.4 X 0.8 X 0.6.   Significant prenatal events:  First Trimester:    Taking Lexapro for anxiety prior to Pregnancy. Stopped taking it when she found out she was pregnant. Episode of  Dizziness/Balckout due to  hypoglycemia from skipping meals, nutrition guidance provided Second Trimester: MAU visit at 216 wks for Panic attack and fall from chair  Depressive symptoms, started Zoloft 25 mg  Third Trimester:    Black out episode at 31 wks taken to MAU via EMS.  Neuro consult ordered. Zoloft increased to 50 mg, Vistaril added.   Attended waterbirth class and has Materials engineerWaterbirth certificate.   Last evaluation: 39.2 wks on 08/05/15 by N.Dillard, MD   FHT 140 bpm, +FM, Braxton Hicks,   BP 100/70 ;  185 lbs  Pt started losing mucus plug on Sunday and experiencing BH contractions, declines cervix check today. No MAU visits since last visit. CP CMA. LABOR REVIEWED . PT STATED SHE REVIEWED HER BIRTH PLAN WITH VICKI. [facility]KC REVIEWED   OB History    Gravida Para Term Preterm AB TAB SAB Ectopic Multiple Living   1              Past Medical History  Diagnosis Date  . Anxiety   . Anemia   . Headache   . UTI (lower urinary tract infection)    History reviewed. No pertinent past surgical history. Family History: family history is not on file. Social History:  reports that she has never smoked. She does not have any smokeless tobacco history on file. She reports that she does not drink alcohol or use illicit drugs.   African American, 2 yr college, homemaker, Ephriam KnucklesChristian, Single  Prenatal Transfer Tool  Maternal Diabetes: No Genetic Screening: Normal Maternal Ultrasounds/Referrals: Normal Fetal Ultrasounds or other Referrals:  None Maternal Substance Abuse:  No Significant Maternal Medications:  Meds include: Zoloft  50 mg  Significant Maternal Lab Results: Lab values include: Group B Strep positive  TDAP 05/13/15  Flu  02/07/15 Rhogam  03/29/15 &  06/17/15   ROS: All systems wnl except as indicated in HPI;  +FM, +ctx, +vb, no lof  Allergies  Allergen Reactions  . Cefzil [Cefprozil] Hives  . Rocephin [Ceftriaxone Sodium In Dextrose] Hives    Initial SVE exam:  1/60/-2  Recheck :   Dilation: 1.5 Effacement (%): 70 Station: -1 Exam by:: R Pritika Alvarez CNM Blood pressure 119/66, pulse 85, temperature 97.9 F (36.6 C), temperature source Oral, resp. rate 18, height 5' 8.5" (1.74 m), weight 83.915 kg (185 lb),  last menstrual period 06/01/2014, SpO2 100 %.  Chest clear Heart RRR without murmur Abd gravid, NT, FH 40 Ext: wnl  FHR: Category 2, 150 bpm, minimal variability, no accels, no decels  UCs:  Regular, every 2-5 min, palpate mild  Prenatal labs: ABO, Rh: --/--/B NEG (06/12 2235) Antibody: NEG (06/12 2235) Rubella:  !Error!   immune RPR: Nonreactive (10/07 0000)  HBsAg: Negative (10/07 0000)  HIV: Non-reactive (10/07 0000)  GBS: Positive (05/02 0000) Sickle cell/Hgb electrophoresis:  % Pap:  wnl  01/17/15  GC:  Negative  12/06/15, 07/17/15 Chlamydia:  Negative   12/06/15, 07/17/15 Genetic screenings:  1rst trimester, Wnl Glucola:  wnl Other:   Hgb 12.3 at NOB, 10.1 at 28 weeks    Assessment/Plan: IUP at 40.1 Latent labor  Membranes intact Rh negative Hx Depression and anxiety, on Zoloft 50 mg GBS positive Cat 2 FT  Plan: Admit to Birthing Suite per consult with Dr. Normand Sloop Routine [facility] orders epidural prn Clindamycin for GBS prophylaxis  Due to multiple drug allergies IV bolus with D5 LR, Oxygen administration and Maternal position change for Cat 2 tracing Continuous monitoring May NOT labor in tub due to Cat 2 tracing  - Must have over 1-2 hrs of Cat 1 tracing prior to considering utilizing labor tub  - will consult Dr. Normand Sloop at that  time   Beatrix Fetters, [place] 08/12/2015, 12:56 AM

## 2015-08-11 NOTE — MAU Note (Signed)
Pt presents complaining of contractions all day that got worse at 2000. Denies leaking. Some bloody show. Reports good fetal movement.

## 2015-08-12 ENCOUNTER — Inpatient Hospital Stay (HOSPITAL_COMMUNITY): Payer: BLUE CROSS/BLUE SHIELD | Admitting: Anesthesiology

## 2015-08-12 ENCOUNTER — Encounter (HOSPITAL_COMMUNITY): Payer: Self-pay

## 2015-08-12 LAB — URINALYSIS, ROUTINE W REFLEX MICROSCOPIC
Bilirubin Urine: NEGATIVE
Glucose, UA: NEGATIVE mg/dL
Ketones, ur: NEGATIVE mg/dL
LEUKOCYTES UA: NEGATIVE
NITRITE: NEGATIVE
PROTEIN: NEGATIVE mg/dL
SPECIFIC GRAVITY, URINE: 1.01 (ref 1.005–1.030)
pH: 7 (ref 5.0–8.0)

## 2015-08-12 LAB — URINE MICROSCOPIC-ADD ON

## 2015-08-12 LAB — RPR: RPR Ser Ql: NONREACTIVE

## 2015-08-12 MED ORDER — SERTRALINE HCL 25 MG PO TABS
25.0000 mg | ORAL_TABLET | Freq: Every day | ORAL | Status: DC
  Administered 2015-08-12 – 2015-08-13 (×2): 25 mg via ORAL
  Filled 2015-08-12 (×4): qty 1

## 2015-08-12 MED ORDER — PRENATAL MULTIVITAMIN CH
1.0000 | ORAL_TABLET | Freq: Every day | ORAL | Status: DC
  Administered 2015-08-12 – 2015-08-14 (×3): 1 via ORAL
  Filled 2015-08-12 (×3): qty 1

## 2015-08-12 MED ORDER — ZOLPIDEM TARTRATE 5 MG PO TABS: 5.0000 mg | ORAL_TABLET | Freq: Every evening | ORAL | Status: DC | PRN

## 2015-08-12 MED ORDER — SENNOSIDES-DOCUSATE SODIUM 8.6-50 MG PO TABS
2.0000 | ORAL_TABLET | ORAL | Status: DC
  Administered 2015-08-13 – 2015-08-14 (×2): 2 via ORAL
  Filled 2015-08-12 (×2): qty 2

## 2015-08-12 MED ORDER — METHYLERGONOVINE MALEATE 0.2 MG/ML IJ SOLN
INTRAMUSCULAR | Status: AC
  Filled 2015-08-12: qty 1

## 2015-08-12 MED ORDER — TETANUS-DIPHTH-ACELL PERTUSSIS 5-2.5-18.5 LF-MCG/0.5 IM SUSP: 0.5000 mL | Freq: Once | INTRAMUSCULAR | Status: DC

## 2015-08-12 MED ORDER — DIPHENHYDRAMINE HCL 25 MG PO CAPS: 25.0000 mg | ORAL_CAPSULE | Freq: Four times a day (QID) | ORAL | Status: DC | PRN

## 2015-08-12 MED ORDER — PHENYLEPHRINE 40 MCG/ML (10ML) SYRINGE FOR IV PUSH (FOR BLOOD PRESSURE SUPPORT)
80.0000 ug | PREFILLED_SYRINGE | INTRAVENOUS | Status: DC | PRN
  Filled 2015-08-12: qty 5

## 2015-08-12 MED ORDER — EPHEDRINE 5 MG/ML INJ
10.0000 mg | INTRAVENOUS | Status: DC | PRN
  Filled 2015-08-12: qty 2

## 2015-08-12 MED ORDER — ONDANSETRON HCL 4 MG PO TABS: 4.0000 mg | ORAL_TABLET | ORAL | Status: DC | PRN

## 2015-08-12 MED ORDER — ACETAMINOPHEN 325 MG PO TABS: 650.0000 mg | ORAL_TABLET | ORAL | Status: DC | PRN

## 2015-08-12 MED ORDER — ONDANSETRON HCL 4 MG/2ML IJ SOLN: 4.0000 mg | INTRAMUSCULAR | Status: DC | PRN

## 2015-08-12 MED ORDER — WITCH HAZEL-GLYCERIN EX PADS: 1.0000 "application " | MEDICATED_PAD | CUTANEOUS | Status: DC | PRN

## 2015-08-12 MED ORDER — LACTATED RINGERS IV SOLN
500.0000 mL | Freq: Once | INTRAVENOUS | Status: AC
  Administered 2015-08-12: 500 mL via INTRAVENOUS

## 2015-08-12 MED ORDER — COCONUT OIL OIL: 1.0000 "application " | TOPICAL_OIL | Status: DC | PRN

## 2015-08-12 MED ORDER — METHYLERGONOVINE MALEATE 0.2 MG/ML IJ SOLN
0.2000 mg | Freq: Once | INTRAMUSCULAR | Status: AC
  Administered 2015-08-12: 0.2 mg via INTRAMUSCULAR

## 2015-08-12 MED ORDER — LACTATED RINGERS IV SOLN: 500.0000 mL | Freq: Once | INTRAVENOUS | Status: DC

## 2015-08-12 MED ORDER — PHENYLEPHRINE 40 MCG/ML (10ML) SYRINGE FOR IV PUSH (FOR BLOOD PRESSURE SUPPORT)
80.0000 ug | PREFILLED_SYRINGE | INTRAVENOUS | Status: DC | PRN
  Filled 2015-08-12: qty 10
  Filled 2015-08-12: qty 5

## 2015-08-12 MED ORDER — DIBUCAINE 1 % RE OINT: 1.0000 "application " | TOPICAL_OINTMENT | RECTAL | Status: DC | PRN

## 2015-08-12 MED ORDER — SERTRALINE HCL 50 MG PO TABS
50.0000 mg | ORAL_TABLET | Freq: Every day | ORAL | Status: DC
  Filled 2015-08-12: qty 1

## 2015-08-12 MED ORDER — FENTANYL 2.5 MCG/ML BUPIVACAINE 1/10 % EPIDURAL INFUSION (WH - ANES)
14.0000 mL/h | INTRAMUSCULAR | Status: DC | PRN
  Administered 2015-08-12: 14 mL/h via EPIDURAL
  Filled 2015-08-12: qty 125

## 2015-08-12 MED ORDER — LIDOCAINE HCL (PF) 1 % IJ SOLN
INTRAMUSCULAR | Status: DC | PRN
  Administered 2015-08-12 (×2): 4 mL via EPIDURAL

## 2015-08-12 MED ORDER — BENZOCAINE-MENTHOL 20-0.5 % EX AERO
1.0000 "application " | INHALATION_SPRAY | CUTANEOUS | Status: DC | PRN
  Administered 2015-08-12: 1 via TOPICAL
  Filled 2015-08-12 (×2): qty 56

## 2015-08-12 MED ORDER — EPHEDRINE 5 MG/ML INJ
10.0000 mg | INTRAVENOUS | Status: DC | PRN
Start: 2015-08-12 — End: 2015-08-12
  Filled 2015-08-12: qty 2

## 2015-08-12 MED ORDER — DIPHENHYDRAMINE HCL 50 MG/ML IJ SOLN: 12.5000 mg | INTRAMUSCULAR | Status: DC | PRN

## 2015-08-12 MED ORDER — IBUPROFEN 600 MG PO TABS
600.0000 mg | ORAL_TABLET | Freq: Four times a day (QID) | ORAL | Status: DC
  Administered 2015-08-12 – 2015-08-14 (×9): 600 mg via ORAL
  Filled 2015-08-12 (×9): qty 1

## 2015-08-12 MED ORDER — SIMETHICONE 80 MG PO CHEW: 80.0000 mg | CHEWABLE_TABLET | ORAL | Status: DC | PRN

## 2015-08-12 NOTE — Anesthesia Preprocedure Evaluation (Signed)
Anesthesia Evaluation  Patient identified by MRN, date of birth, ID band Patient awake    Reviewed: Allergy & Precautions, NPO status , Patient's Chart, lab work & pertinent test results  Airway Mallampati: II  TM Distance: >3 FB Neck ROM: Full    Dental  (+) Teeth Intact, Dental Advisory Given   Pulmonary    breath sounds clear to auscultation       Cardiovascular negative cardio ROS   Rhythm:Regular Rate:Normal     Neuro/Psych  Headaches, PSYCHIATRIC DISORDERS Anxiety Depression    GI/Hepatic negative GI ROS, Neg liver ROS,   Endo/Other  negative endocrine ROS  Renal/GU negative Renal ROS  negative genitourinary   Musculoskeletal negative musculoskeletal ROS (+)   Abdominal   Peds negative pediatric ROS (+)  Hematology negative hematology ROS (+)   Anesthesia Other Findings   Reproductive/Obstetrics (+) Pregnancy                             Lab Results  Component Value Date   WBC 9.3 08/11/2015   HGB 10.3* 08/11/2015   HCT 30.4* 08/11/2015   MCV 89.9 08/11/2015   PLT 250 08/11/2015   No results found for: INR, PROTIME   Anesthesia Physical Anesthesia Plan  ASA: II  Anesthesia Plan: Epidural   Post-op Pain Management:    Induction:   Airway Management Planned:   Additional Equipment:   Intra-op Plan:   Post-operative Plan:   Informed Consent: I have reviewed the patients History and Physical, chart, labs and discussed the procedure including the risks, benefits and alternatives for the proposed anesthesia with the patient or authorized representative who has indicated his/her understanding and acceptance.     Plan Discussed with:   Anesthesia Plan Comments:         Anesthesia Quick Evaluation

## 2015-08-12 NOTE — Anesthesia Postprocedure Evaluation (Signed)
Anesthesia Post Note  Patient: Diane KillianJalisa [name]  Procedure(s) Performed: * No procedures listed *  Patient location during evaluation: Mother Baby Anesthesia Type: Epidural Level of consciousness: awake and alert Pain management: pain level controlled Vital Signs Assessment: post-procedure vital signs reviewed and stable Respiratory status: spontaneous breathing, nonlabored ventilation and respiratory function stable Cardiovascular status: stable Postop Assessment: no headache, no backache and epidural receding Anesthetic complications: no     Last Vitals:  Filed Vitals:   08/12/15 0916 08/12/15 0931  BP: 128/81 124/89  Pulse: 83 79  Temp:    Resp: 18 18    Last Pain:  Filed Vitals:   08/12/15 0955  PainSc: 9    Pain Goal:                 Junious SilkGILBERT,Hester Forget

## 2015-08-12 NOTE — Anesthesia Procedure Notes (Signed)
Epidural Patient location during procedure: OB Start time: 08/12/2015 5:03 AM End time: 08/12/2015 5:10 AM  Staffing Anesthesiologist: Shona SimpsonHOLLIS, Haizel Gatchell D Performed by: anesthesiologist   Preanesthetic Checklist Completed: patient identified, site marked, surgical consent, pre-op evaluation, timeout performed, IV checked, risks and benefits discussed and monitors and equipment checked  Epidural Patient position: sitting Prep: ChloraPrep Patient monitoring: heart rate, continuous pulse ox and blood pressure Approach: midline Location: L3-L4 Injection technique: LOR saline  Needle:  Needle type: Tuohy  Needle gauge: 17 G Needle length: 9 cm Catheter type: closed end flexible Catheter size: 20 Guage Test dose: negative and 1.5% lidocaine  Assessment Events: blood not aspirated, injection not painful, no injection resistance and no paresthesia  Additional Notes LOR @ 5.5  Patient identified. Risks/Benefits/Options discussed with patient including but not limited to bleeding, infection, nerve damage, paralysis, failed block, incomplete pain control, headache, blood pressure changes, nausea, vomiting, reactions to medications, itching and postpartum back pain. Confirmed with bedside nurse the patient's most recent platelet count. Confirmed with patient that they are not currently taking any anticoagulation, have any bleeding history or any family history of bleeding disorders. Patient expressed understanding and wished to proceed. All questions were answered. Sterile technique was used throughout the entire procedure. Please see nursing notes for vital signs. Test dose was given through epidural catheter and negative prior to continuing to dose epidural or start infusion. Warning signs of high block given to the patient including shortness of breath, tingling/numbness in hands, complete motor block, or any concerning symptoms with instructions to call for help. Patient was given instructions on  fall risk and not to get out of bed. All questions and concerns addressed with instructions to call with any issues or inadequate analgesia.    Reason for block:procedure for pain

## 2015-08-12 NOTE — Lactation Note (Signed)
This note was copied from a baby's chart. Lactation Consultation Note  Patient Name: Diane [name] Diane [name]: 08/12/2015 Reason for consult: Follow-up assessment Baby at 13 hr of life and RN was requesting help with latch. Mom has soft breast with a small nipple. She was putting her hand on the top of baby's head and shoving him onto the breast. Showed her how to support his head/neck, compress the breast, stroke the lip, wait for the wide open mouth, and guide baby onto the breast. She denies breast or nipple pain, voiced no concerns. Demonstrated manual expression, colostrum noted bilaterally, spoon in room. Discussed baby behavior, feeding frequency, baby belly size, voids, wt loss, breast changes, and nipple care. Given lactation handouts. Aware of OP services and support group.   Maternal Data Has patient been taught Hand Expression?: Yes Does the patient have breastfeeding experience prior to this delivery?: No  Feeding Feeding Type: Breast Fed Length of feed: 15 min  LATCH Score/Interventions Latch: Repeated attempts needed to sustain latch, nipple held in mouth throughout feeding, stimulation needed to elicit sucking reflex. Intervention(s): Skin to skin Intervention(s): Adjust position;Assist with latch;Breast compression  Audible Swallowing: A few with stimulation Intervention(s): Hand expression;Skin to skin Intervention(s): Alternate breast massage  Type of Nipple: Everted at rest and after stimulation  Comfort (Breast/Nipple): Soft / non-tender     Hold (Positioning): Assistance needed to correctly position infant at breast and maintain latch. Intervention(s): Position options;Support Pillows  LATCH Score: 7  Lactation Tools Discussed/Used WIC Program: No   Consult Status Consult Status: Follow-up [name]: 08/13/15 Follow-up type: In-patient    Rulon Eisenmengerlizabeth E [place] 08/12/2015, 10:17 PM

## 2015-08-12 NOTE — Progress Notes (Signed)
Subjective:   Objective: BP 108/61 mmHg  Pulse 77  Temp(Src) 98.7 F (37.1 C) (Oral)  Resp 20  Ht 5' 8.5" (1.74 m)  Wt 83.915 kg (185 lb)  BMI 27.72 kg/m2  SpO2 100%  LMP 06/01/2014      FHT: Category 2, 140-150 bpm, min-moderate variability, occasional accels, no decels UC:   regular, every 2-4 minutes SVE:   Dilation: 4 Effacement (%): 90 Station: -1 Exam by:: R Legend Pecore CNM Membranes Intact, BBOW Augmentation/Induction: Cytotec: none Pitocin: none  Pain management: self coping, movement, breathing  GBS prophylaxis:  Clindamycin x1 dose, last at 0002  Assessment:  IUP at 40.2 Early labor  Membranes intact, BBOW  Steady Cervical change Rh negative Hx Depression and anxiety, on Zoloft 50 mg GBS positive Cat 2 FT  Plan: Continue continuous monitoring Continue Expectant management Continue IV  Not presently a candidate [facility]birth [facility]   May consider waterbirth eligibility if Cat 1  FT for over 1-2 hrs Pt desires non pharmacologic pain management  Nitrous oxide/ Epidural available prn Pt request  membranes to rupture on their own - will hold off AROM Recheck cervix in 3 hrs @ 630 unless  Indicated or pt requests earlier    Alphonzo Severanceachel Antoinette Haskett CNM, [place] 08/12/2015, 3:30 AM

## 2015-08-12 NOTE — Lactation Note (Signed)
This note was copied from a baby's chart. Lactation Consultation Note  Patient Name: Diane [name] VHQIO'NToday's Date: 08/12/2015 Reason for consult: Initial assessment Baby at 9 hr of life. Mom was sleeping. Left handouts with FOB and instructions for mom to call at next bf.   Maternal Data    Feeding Feeding Type: Breast Fed Length of feed: 5 min  LATCH Score/Interventions Latch: Repeated attempts needed to sustain latch, nipple held in mouth throughout feeding, stimulation needed to elicit sucking reflex. (latches with some difficulty, sleepy at this time) Intervention(s): Waking techniques;Teach feeding cues;Skin to skin Intervention(s): Adjust position;Assist with latch;Breast massage;Breast compression  Audible Swallowing: None Intervention(s): Skin to skin;Hand expression Intervention(s): Hand expression  Type of Nipple: Everted at rest and after stimulation  Comfort (Breast/Nipple): Soft / non-tender     Hold (Positioning): Assistance needed to correctly position infant at breast and maintain latch.  LATCH Score: 6  Lactation Tools Discussed/Used     Consult Status Consult Status: Follow-up Date: 08/12/15 Follow-up type: In-patient    Rulon Eisenmengerlizabeth E [name] 08/12/2015, 5:33 PM

## 2015-08-12 NOTE — Progress Notes (Signed)
Subjective: Comfortable S/p epidural  Objective: BP 115/75 mmHg  Pulse 90  Temp(Src) 98.7 F (37.1 C) (Oral)  Resp 20  Ht 5' 8.5" (1.74 m)  Wt 83.915 kg (185 lb)  BMI 27.72 kg/m2  SpO2 99%  LMP 06/01/2014      FHT: Category 2, minimal- moderate variability ,  130-140 bpm, no accelerations, early decelerations UC:   regular, every 2-4 minutes SVE:   Dilation: 8 Effacement (%): 90 Station: -1 Exam by:: Diane Johnathan Tortorelli CNM Membranes: AROM at  0630, clear, moderate Augmentation/Induction: Cytotec: none Pitocin: none  Pain management: epidural   GBS prophylaxis: Clindamycin x1 dose, last at 0002  Assessment:  IUP at 40.2 Transitional labor  AROM   Rh negative Hx Depression and anxiety, on Zoloft 50 mg GBS positive Cat 2 FT  Plan: continuous monitoring Continue Expectant management Continue other management as ordered    Diane [name] CNM, [place] 08/12/2015, 6:44 AM   Addendum:  Feeling pressure.  Rechecked Cervix  Dilation: 9 Effacement (%): 90 Cervical Position: Middle Station: 0, +1 Presentation: Vertex Exam by:: Diane [name] CNM    Plan,  IV Maternal Bolus for variability Report to oncoming provider, Lance MorinA. Roberts, MD   Diane Severanceachel Demica Zook, CNM, MSN 08/12/15    [id number]

## 2015-08-13 LAB — CBC
HCT: 24.2 % — ABNORMAL LOW (ref 36.0–46.0)
HEMOGLOBIN: 8.5 g/dL — AB (ref 12.0–15.0)
MCH: 31.5 pg (ref 26.0–34.0)
MCHC: 35.1 g/dL (ref 30.0–36.0)
MCV: 89.6 fL (ref 78.0–100.0)
PLATELETS: 234 10*3/uL (ref 150–400)
RBC: 2.7 MIL/uL — AB (ref 3.87–5.11)
RDW: 13.7 % (ref 11.5–15.5)
WBC: 15.6 10*3/uL — AB (ref 4.0–10.5)

## 2015-08-13 MED ORDER — RHO D IMMUNE GLOBULIN 1500 UNIT/2ML IJ SOSY
300.0000 ug | PREFILLED_SYRINGE | Freq: Once | INTRAMUSCULAR | Status: AC
  Administered 2015-08-13: 300 ug via INTRAVENOUS
  Filled 2015-08-13: qty 2

## 2015-08-13 MED ORDER — FERROUS SULFATE 325 (65 FE) MG PO TABS
325.0000 mg | ORAL_TABLET | Freq: Two times a day (BID) | ORAL | Status: DC
  Administered 2015-08-13 – 2015-08-14 (×2): 325 mg via ORAL
  Filled 2015-08-13 (×2): qty 1

## 2015-08-13 NOTE — Clinical Social Work Maternal (Signed)
  CLINICAL SOCIAL WORK MATERNAL/CHILD NOTE  Patient Details  Name: Azya Barbero MRN: [id number] Date of Birth: [birth year]  Date:  08/13/2015  Clinical Social Worker Initiating Note:  Laurey Arrow Date/ Time Initiated:  08/13/15/0930     Child's Name:  Pamalee Leyden   Legal Guardian:  Mother   Need for Interpreter:  None   Date of Referral:  08/13/15     Reason for Referral:      Referral Source:  Central Nursery   Address:  [address]   Phone number:  [phone]   Household Members:  Self, Significant Other   Natural Supports (not living in the home):  Immediate Family, Extended Family, Parent, Spouse/significant other   Professional Supports: Organized support group (Comment) Sports administrator)   Employment: Unemployed   Type of Work:     Education:  Database administrator Resources:  Multimedia programmer   Other Resources:  [employer], Physicist, medical    Cultural/Religious Considerations Which May Impact Care:  none reported  Strengths:  Ability to meet basic needs , Compliance with medical plan , Home prepared for child , Engineer, materials , Understanding of illness   Risk Factors/Current Problems:  Mental Health Concerns    Cognitive State:  Alert , Insightful , Goal Oriented , Linear Thinking    Mood/Affect:  Comfortable , Interested , Happy , Bright , Relaxed    CSW Assessment: CSW met with MOB to complete an assessment for a consult for hx of anxiety and depression and blackouts.  MOB had a room visitor, and he was introduced to [place] as FOB [facility] Alyson Reedy).  MOB gave CSW permission to speak with her in the presence of FOB.  Both parents were polite, inviting, and interested in speaking with CSW.  CSW inquired about MOB's supports, and MOB stated that she has a wealth of support from immediate family, FOB's family, and extended family. MOB communicated that she feels prepared as a new mother, and shared that at this time she is  able to provide all the necessities for her infant.  CSW educated MOB and FOB about SIDS, and MOB was very knowledgeable. MOB communicated that she received some education from the [facility] program, however she did ask CSW appropriate questions. CSW also educated MOB about PPD. CSW informed MOB and FOB of possible supports and interventions to decrease PPD.  CSW also encouraged MOB to seek medical attention if needed for increased signs and symptoms for PPD. MOB stated that currently her OBGYN is providing medication management, and she plans to follow-up with the practice if needed.  MOB declined resources and referral for behavioral health counseling, and CSW encouraged MOB to contact her OBGYN, the [facility] program, or CSW if resources or referrasl are warranted in the future. MOB disclosed that she currently taking Zoloft ('50MG'$ ), and at this time she overall feels good, happy, and excited about being a new mother.  CSW thanked MOB for allowing her to meet with her, and encouraged MOB to reach out to CSW if she had any future questions or concerns.   CSW Plan/Description:  Patient/Family Education , No Further Intervention Required/No Barriers to Discharge    Anshu Wehner D BOYD-GILYARD, LCSW 08/13/2015, 10:47 AM

## 2015-08-13 NOTE — Lactation Note (Addendum)
This note was copied from a baby's chart. Lactation Consultation Note; Mom reports baby has been nursing well. Nipples are a little tender and mom is rubbing EBM into them after nursing. Parents resting, baby asleep. No questions at present. To call for assist prn  Patient Name: Boy Charise KillianJalisa Ketchem AVWUJ'WToday's Date: 08/13/2015 Reason for consult: Follow-up assessment   Maternal Data Formula Feeding for Exclusion: No Has patient been taught Hand Expression?: Yes Does the patient have breastfeeding experience prior to this delivery?: No  Feeding Feeding Type: Breast Fed Length of feed: 25 min  LATCH Score/Interventions                      Lactation Tools Discussed/Used     Consult Status Consult Status: Follow-up Date: 08/14/15 Follow-up type: In-patient    Pamelia HoitWeeks, Kemaria Dedic D 08/13/2015, 2:59 PM

## 2015-08-13 NOTE — Progress Notes (Signed)
Post Partum Day 1 Subjective: no complaints, up ad lib, voiding and tolerating PO  Objective: Blood pressure 105/63, pulse 94, temperature 97.9 F (36.6 C), temperature source Oral, resp. rate 18, height 5' 8.5" (1.74 m), weight 185 lb (83.915 kg), last menstrual period 06/01/2014, SpO2 100 %, unknown if currently breastfeeding.  Physical Exam:  General: alert and cooperative Lochia: appropriate Uterine Fundus: firm Incision: na DVT Evaluation: No evidence of DVT seen on physical exam.   Recent Labs  08/11/15 2235 08/13/15 0554  HGB 10.3* 8.5*  HCT 30.4* 24.2*    Assessment/Plan: Plan for discharge tomorrow and Breastfeeding   LOS: 2 days   Diane [name] A 08/13/2015, 2:48 PM

## 2015-08-14 LAB — RH IG WORKUP (INCLUDES ABO/RH)
ABO/RH(D): B NEG
FETAL SCREEN: NEGATIVE
Gestational Age(Wks): 40.2
Unit division: 0

## 2015-08-14 MED ORDER — IBUPROFEN 600 MG PO TABS: 600.0000 mg | ORAL_TABLET | Freq: Four times a day (QID) | ORAL | Status: DC | PRN

## 2015-08-14 MED ORDER — FERROUS SULFATE 325 (65 FE) MG PO TABS: 325.0000 mg | ORAL_TABLET | Freq: Two times a day (BID) | ORAL | Status: DC

## 2015-08-14 NOTE — Discharge Summary (Signed)
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   GBS carrier   Post-dates pregnancy  Term Pregnancy Delivered    Discharge diagnosis:    40w labor ctx 3-4 min apart Principal Problem:   Spontaneous vaginal delivery Active Problems:   Anxiety   Rh negative state in antepartum period   Allergy to multiple antibiotics--Rocephin, Cefzil   Depression affecting pregnancy, 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 Uterine atony. Cord with true knot x 2.        Hospital Course: Onset of Labor With Vaginal Delivery     20 y.o. yo G1P1001 at [redacted]w[redacted]d was admitted in Latent Labor on 08/11/2015. Patient had an uncomplicated labor course as follows:  Membrane Rupture Time/Date: 6:30 AM ,08/12/2015   Intrapartum Procedures: Episiotomy: Median [2]                                         Lacerations:  2nd degree [3];Vaginal [6]   Epidural Patient had a delivery of a Viable infant. 08/12/2015  Information for the patient's newborn:  Calene, Paradiso [[id number]]   Delivery Method: Vaginal, Spontaneous Delivery (Filed from Delivery Summary)    Patient had an uncomplicated postpartum course.  She is ambulating, tolerating a regular diet, passing flatus, and urinating well. Patient is discharged home in stable condition on 08/14/2015.She received Rhophylac.    Physical Exam:   Filed Vitals:   08/12/15 2043 08/13/15 0608 08/13/15 1816 08/13/15 1842  BP: 107/63 105/63 89/50 100/53  Pulse: 87 94 97   Temp: 97.9 F (36.6 C)  98.2 F (36.8 C)   TempSrc:   Oral   Resp: Height:      Weight:      SpO2:       General: alert, cooperative and no distress Lochia: appropriate Uterine Fundus: firm Incision: Healing well with no significant drainage DVT Evaluation: No evidence of DVT seen on physical exam. Negative Homan's sign. No significant calf/ankle edema.  Labs: Lab Results  Component Value Date   WBC 15.6* 08/13/2015  HGB 8.5* 08/13/2015   HCT 24.2* 08/13/2015   MCV 89.6 08/13/2015   PLT 234 08/13/2015    Prenatal labs: ABO, Rh: --/--/B NEG (06/12 2235) Antibody: NEG (06/12 2235) Rubella:Immune RPR: Nonreactive (10/07 0000)  HBsAg: Negative (10/07 0000)  HIV: Non-reactive (10/07 0000)  GBS: Positive (05/02 0000) Sickle cell/Hgb electrophoresis: % Pap: wnl 01/17/15 GC: Negative 12/06/15, 07/17/15 Chlamydia: Negative 12/06/15, 07/17/15 Genetic screenings: 1rst trimester, Wnl Glucola: wnl Other:  Hgb 12.3 at NOB, 10.1 at 28 weeks  Discharge instruction: per After Visit Summary and "Baby and Me Booklet".  After Visit Meds:    Medication List    ASK your doctor about these medications        acetaminophen 500 MG tablet  Commonly known as:  TYLENOL  Take 1,000 mg by mouth every 6 (six) hours as needed for moderate pain.     prenatal multivitamin Tabs tablet  Take 1 tablet by mouth daily at 12 noon.     sertraline 25 MG tablet  Commonly known as:  ZOLOFT  Take 25 mg by mouth daily.       Ibuprofen 600 mg tablet Commonly known as: Motrin Take 1 tablet by mouth every 6 hours as needed for pain  Diet: routine diet  Activity: Advance as tolerated. Pelvic rest for 6 weeks.   Outpatient follow up:5 weeks  Postpartum contraception: Likely IUD at 6 wk visit  08/14/2015 Sherre ScarletWILLIAMS, Aaryanna Hyden, CNM

## 2015-08-14 NOTE — Lactation Note (Addendum)
This note was copied from a baby's chart. Lactation Consultation Note  Patient Name: Diane [name] ZOXWR'UToday's Date: 08/14/2015 Reason for consult: Follow-up assessment Baby 49 hours old. Mom just latching baby to right breast in football position as this LC entered the room. Baby latched deeply with lips flanged and suckled rhythmically with intermittent swallows noted. While nursing, the baby pulls off and rests, then is willing to latch and nurse again. Discussed with mom that the baby seems to be catching his breath and then wanting to nurse again. Discussed milk flow with mom. Also discussed engorgement prevention/treatment and progression of milk coming to volume. Referred parents to Baby and Me booklet for number of diapers to expect by day of life and EBM storage guidelines. Mom aware of OP/BFSG and LC phone line assistance after D/C. Mom states that she has had some nipple soreness, but she knows to use EBM and it has been helping with healing. Mom also enc to keep baby latched deeply. Discussed nursing and pumping and when to introduce bottles as well.   Maternal Data    Feeding Feeding Type: Breast Fed Length of feed:  (LC assessed first 10 minutes of BF. )  LATCH Score/Interventions Latch: Grasps breast easily, tongue down, lips flanged, rhythmical sucking. Intervention(s): Skin to skin  Audible Swallowing: Spontaneous and intermittent  Type of Nipple: Everted at rest and after stimulation  Comfort (Breast/Nipple): Soft / non-tender     Hold (Positioning): No assistance needed to correctly position infant at breast.  LATCH Score: 10  Lactation Tools Discussed/Used     Consult Status Consult Status: PRN    Geralynn OchsWILLIARD, Roberto Romanoski 08/14/2015, 9:41 AM

## 2015-08-14 NOTE — Discharge Instructions (Signed)
Anemia, Nonspecific Anemia is a condition in which the concentration of red blood cells or hemoglobin in the blood is below normal. Hemoglobin is a substance in red blood cells that carries oxygen to the tissues of the body. Anemia results in not enough oxygen reaching these tissues.  CAUSES  Common causes of anemia include:   Excessive bleeding. Bleeding may be internal or external. This includes excessive bleeding from periods (in women) or from the intestine.   Poor nutrition.   Chronic kidney, thyroid, and liver disease.  Bone marrow disorders that decrease red blood cell production.       Cancer and treatments for cancer.  Iron-Rich Diet Iron is a mineral that helps your body to produce hemoglobin. Hemoglobin is a protein in your red blood cells that carries oxygen to your body's tissues. Eating too little iron may cause you to feel weak and tired, and it can increase your risk for infection. Eating enough iron is necessary for your body's metabolism, muscle function, and nervous system. Iron is naturally found in many foods. It can also be added to foods or fortified in foods. There are two types of dietary iron: Heme iron. Heme iron is absorbed by the body more easily than nonheme iron. Heme iron is found in meat, poultry, and fish. Nonheme iron. Nonheme iron is found in dietary supplements, iron-fortified grains, beans, and vegetables. You may need to follow an iron-rich diet if: You have been diagnosed with iron deficiency or iron-deficiency anemia. You have a condition that prevents you from absorbing dietary iron, such as: Infection in your intestines. Celiac disease. This involves long-lasting (chronic) inflammation of your intestines. You do not eat enough iron. You eat a diet that is high in foods that impair iron absorption. You have lost a lot of blood. You have heavy bleeding during your menstrual cycle. You are pregnant. WHAT IS MY PLAN? Your health care provider  may help you to determine how much iron you need per day based on your condition. Generally, when a person consumes sufficient amounts of iron in the diet, the following iron needs are met: Men. 64-82 years old: 11 mg per day. 77-17 years old: 8 mg per day. Women.  26-43 years old: 15 mg per day. 39-71 years old: 18 mg per day. Over 39 years old: 8 mg per day. Pregnant women: 27 mg per day. Breastfeeding women: 9 mg per day. WHAT DO I NEED TO KNOW ABOUT AN IRON-RICH DIET? Eat fresh fruits and vegetables that are high in vitamin C along with foods that are high in iron. This will help increase the amount of iron that your body absorbs from food, especially with foods containing nonheme iron. Foods that are high in vitamin C include oranges, peppers, tomatoes, and mango. Take iron supplements only as directed by your health care provider. Overdose of iron can be life-threatening. If you were prescribed iron supplements, take them with orange juice or a vitamin C supplement. Cook foods in pots and pans that are made from iron.  Eat nonheme iron-containing foods alongside foods that are high in heme iron. This helps to improve your iron absorption.  Certain foods and drinks contain compounds that impair iron absorption. Avoid eating these foods in the same meal as iron-rich foods or with iron supplements. These include: Coffee, black tea, and red wine. Milk, dairy products, and foods that are high in calcium. Beans, soybeans, and peas. Whole grains. When eating foods that contain both nonheme iron and compounds  that impair iron absorption, follow these tips to absorb iron better.  Soak beans overnight before cooking. Soak whole grains overnight and drain them before using. Ferment flours before baking, such as using yeast in bread dough. WHAT FOODS CAN I EAT? Grains Iron-fortified breakfast cereal. Iron-fortified whole-wheat bread. Enriched rice. Sprouted grains. Vegetables Spinach.  Potatoes with skin. Green peas. Broccoli. Red and green bell peppers. Fermented vegetables. Fruits Prunes. Raisins. Oranges. Strawberries. Mango. Grapefruit. Meats and Other Protein Sources Beef liver. Oysters. Beef. Shrimp. [place]. Chicken. [place]. Sardines. Chickpeas. Nuts. Tofu. Beverages Tomato juice. Fresh orange juice. Prune juice. Hibiscus tea. Fortified instant breakfast shakes. Condiments Tahini. Fermented soy sauce. Sweets and Desserts Black-strap molasses.  Other Wheat germ. The items listed above may not be a complete list of recommended foods or beverages. Contact your dietitian for more options. WHAT FOODS ARE NOT RECOMMENDED? Grains Whole grains. Bran cereal. Bran flour. Oats. Vegetables Artichokes. Brussels sprouts. Kale. Fruits Blueberries. Raspberries. Strawberries. Figs. Meats and Other Protein Sources Soybeans. Products made from soy protein. Dairy Milk. Cream. Cheese. Yogurt. Cottage cheese. Beverages Coffee. Black tea. Red wine. Sweets and Desserts Cocoa. Chocolate. Ice cream. Other Basil. Oregano. Parsley. The items listed above may not be a complete list of foods and beverages to avoid. Contact your dietitian for more information.   This information is not intended to replace advice given to you by your health care provider. Make sure you discuss any questions you have with your health care provider.   Document Released: 09/29/2004 Document Revised: 03/08/2014 Document Reviewed: 09/12/2013 Elsevier Interactive Patient Education 2016 [employer].    HIV, AIDS, and their treatments.  Spleen problems that increase red blood cell destruction.  Blood disorders.  Excess destruction of red blood cells due to infection, medicines, and autoimmune disorders. SIGNS AND SYMPTOMS   Minor weakness.   Dizziness.   Headache.  Palpitations.   Shortness of breath, especially with exercise.   Paleness.  Cold  sensitivity.  Indigestion.  Nausea.  Difficulty sleeping.  Difficulty concentrating. Symptoms may occur suddenly or they may develop slowly.  DIAGNOSIS  Additional blood tests are often needed. These help your health care provider determine the best treatment. Your health care provider will check your stool for blood and look for other causes of blood loss.  TREATMENT  Treatment varies depending on the cause of the anemia. Treatment can include:   Supplements of iron, vitamin H68, or folic acid.   Hormone medicines.   A blood transfusion. This may be needed if blood loss is severe.   Hospitalization. This may be needed if there is significant continual blood loss.   Dietary changes.  Spleen removal. HOME CARE INSTRUCTIONS Keep all follow-up appointments. It often takes many weeks to correct anemia, and having your health care provider check on your condition and your response to treatment is very important. SEEK IMMEDIATE MEDICAL CARE IF:   You develop extreme weakness, shortness of breath, or chest pain.   You become dizzy or have trouble concentrating.  You develop heavy vaginal bleeding.   You develop a rash.   You have bloody or black, tarry stools.   You faint.   You vomit up blood.   You vomit repeatedly.   You have abdominal pain.  You have a fever or persistent symptoms for more than 2-3 days.   You have a fever and your symptoms suddenly get worse.   You are dehydrated.  MAKE SURE YOU:  Understand these instructions.  Will watch your condition.  Will get help right away if you are not doing well or get worse.   This information is not intended to replace advice given to you by your health care provider. Make sure you discuss any questions you have with your health care provider.   Document Released: 03/25/2004 Document Revised: 10/18/2012 Document Reviewed: 08/11/2012 Elsevier Interactive Patient Education 2016 [employer]. Postpartum Depression and Baby Blues The postpartum period begins right after the birth of a baby. During this time, there is often a great amount of joy and excitement. It is also a time of many changes in the life of the parents. Regardless of how many times a mother gives birth, each child brings new challenges and dynamics to the family. It is not unusual to have feelings of excitement along with confusing shifts in moods, emotions, and thoughts. All mothers are at risk of developing postpartum depression or the "baby blues." These mood changes can occur right after giving birth, or they may occur many months after giving birth. The baby blues or postpartum depression can be mild or severe. Additionally, postpartum depression can go away rather quickly, or it can be a long-term condition.  CAUSES Raised hormone levels and the rapid drop in those levels are thought to be a main cause of postpartum depression and the baby blues. A number of hormones change during and after pregnancy. Estrogen and progesterone usually decrease right after the delivery of your baby. The levels of thyroid hormone and various cortisol steroids also rapidly drop. Other factors that play a role in these mood changes include major life events and genetics.  RISK FACTORS If you have any of the following risks for the baby blues or postpartum depression, know what symptoms to watch out for during the postpartum period. Risk factors that may increase the likelihood of getting the baby blues or postpartum depression include:  Having a personal or family history of depression.   Having depression while being pregnant.   Having premenstrual mood issues or mood issues related to oral contraceptives.  Having a lot of life stress.   Having marital conflict.   Lacking a social support network.   Having a baby with special needs.   Having health problems, such as diabetes.  SIGNS AND SYMPTOMS Symptoms of baby  blues include:  Brief changes in mood, such as going from extreme happiness to sadness.  Decreased concentration.   Difficulty sleeping.   Crying spells, tearfulness.   Irritability.   Anxiety.  Symptoms of postpartum depression typically begin within the first month after giving birth. These symptoms include:  Difficulty sleeping or excessive sleepiness.   Marked weight loss.   Agitation.   Feelings of worthlessness.   Lack of interest in activity or food.  Postpartum psychosis is a very serious condition and can be dangerous. Fortunately, it is rare. Displaying any of the following symptoms is cause for immediate medical attention. Symptoms of postpartum psychosis include:   Hallucinations and delusions.   Bizarre or disorganized behavior.   Confusion or disorientation.  DIAGNOSIS  A diagnosis is made by an evaluation of your symptoms. There are no medical or lab tests that lead to a diagnosis, but there are various questionnaires that a health care provider may use to identify those with the baby blues, postpartum depression, or psychosis. Often, a screening tool called the [place] Postnatal Depression Scale is used to diagnose depression in the postpartum period.  TREATMENT The baby blues usually goes away on its own in 1-2  weeks. Social support is often all that is needed. You will be encouraged to get adequate sleep and rest. Occasionally, you may be given medicines to help you sleep.  Postpartum depression requires treatment because it can last several months or longer if it is not treated. Treatment may include individual or group therapy, medicine, or both to address any social, physiological, and psychological factors that may play a role in the depression. Regular exercise, a healthy diet, rest, and social support may also be strongly recommended.  Postpartum psychosis is more serious and needs treatment right away. Hospitalization is often needed. HOME  CARE INSTRUCTIONS  Get as much rest as you can. Nap when the baby sleeps.   Exercise regularly. Some women find yoga and walking to be beneficial.   Eat a balanced and nourishing diet.   Do little things that you enjoy. Have a cup of tea, take a bubble bath, read your favorite magazine, or listen to your favorite music.  Avoid alcohol.   Ask for help with household chores, cooking, grocery shopping, or running errands as needed. Do not try to do everything.   Talk to people close to you about how you are feeling. Get support from your partner, family members, friends, or other new moms.  Try to stay positive in how you think. Think about the things you are grateful for.   Do not spend a lot of time alone.   Only take over-the-counter or prescription medicine as directed by your health care provider.  Keep all your postpartum appointments.   Let your health care provider know if you have any concerns.  SEEK MEDICAL CARE IF: You are having a reaction to or problems with your medicine. SEEK IMMEDIATE MEDICAL CARE IF:  You have suicidal feelings.   You think you may harm the baby or someone else. MAKE SURE YOU:  Understand these instructions.  Will watch your condition.  Will get help right away if you are not doing well or get worse.   This information is not intended to replace advice given to you by your health care provider. Make sure you discuss any questions you have with your health care provider.   Document Released: 11/20/2003 Document Revised: 02/20/2013 Document Reviewed: 11/27/2012 Elsevier Interactive Patient Education 2016 Elsevier Inc[employer] Breastfeeding and Mastitis Mastitis is inflammation of the breast tissue. It can occur in women who are breastfeeding. This can make breastfeeding painful. Mastitis will sometimes go away on its own. Your health care provider will help determine if treatment is needed. CAUSES Mastitis is often associated with a  blocked milk (lactiferous) duct. This can happen when too much milk builds up in the breast. Causes of excess milk in the breast can include:  Poor latch-on. If your baby is not latched onto the breast properly, she or he may not empty your breast completely while breastfeeding.  Allowing too much time to pass between feedings.  Wearing a bra or other clothing that is too tight. This puts extra pressure on the lactiferous ducts so milk does not flow through them as it should. Mastitis can also be caused by a bacterial infection. Bacteria may enter the breast tissue through cuts or openings in the skin. In women who are breastfeeding, this may occur because of cracked or irritated skin. Cracks in the skin are often caused when your baby does not latch on properly to the breast. SIGNS AND SYMPTOMS  Swelling, redness, tenderness, and pain in an area of the breast.  Swelling  of the glands under the arm on the same side.  Fever may or may not accompany mastitis. If an infection is allowed to progress, a collection of pus (abscess) may develop. DIAGNOSIS  Your health care provider can usually diagnose mastitis based on your symptoms and a physical exam. Tests may be done to help confirm the diagnosis. These may include:  Removal of pus from the breast by applying pressure to the area. This pus can be examined in the lab to determine which bacteria are present. If an abscess has developed, the fluid in the abscess can be removed with a needle. This can also be used to confirm the diagnosis and determine the bacteria present. In most cases, pus will not be present.  Blood tests to determine if your body is fighting a bacterial infection.  Mammogram or ultrasound tests to rule out other problems or diseases. TREATMENT  Mastitis that occurs with breastfeeding will sometimes go away on its own. Your health care provider may choose to wait 24 hours after first seeing you to decide whether a prescription  medicine is needed. If your symptoms are worse after 24 hours, your health care provider will likely prescribe an antibiotic medicine to treat the mastitis. He or she will determine which bacteria are most likely causing the infection and will then select an appropriate antibiotic medicine. This is sometimes changed based on the results of tests performed to identify the bacteria, or if there is no response to the antibiotic medicine selected. Antibiotic medicines are usually given by mouth. You may also be given medicine for pain. HOME CARE INSTRUCTIONS  Only take over-the-counter or prescription medicines for pain, fever, or discomfort as directed by your health care provider.  If your health care provider prescribed an antibiotic medicine, take the medicine as directed. Make sure you finish it even if you start to feel better.  Do not wear a tight or underwire bra. Wear a soft, supportive bra.  Increase your fluid intake, especially if you have a fever.  Continue to empty the breast. Your health care provider can tell you whether this milk is safe for your infant or needs to be thrown out. You may be told to stop nursing until your health care provider thinks it is safe for your baby. Use a breast pump if you are advised to stop nursing.  Keep your nipples clean and dry.  Empty the first breast completely before going to the other breast. If your baby is not emptying your breasts completely for some reason, use a breast pump to empty your breasts.  If you go back to work, pump your breasts while at work to stay in time with your nursing schedule.  Avoid allowing your breasts to become overly filled with milk (engorged). SEEK MEDICAL CARE IF:  You have pus-like discharge from the breast.  Your symptoms do not improve with the treatment prescribed by your health care provider within 2 days. SEEK IMMEDIATE MEDICAL CARE IF:  Your pain and swelling are getting worse.  You have pain that is  not controlled with medicine.  You have a red line extending from the breast toward your armpit.  You have a fever or persistent symptoms for more than 2-3 days.  You have a fever and your symptoms suddenly get worse. MAKE SURE YOU:   Understand these instructions.  Will watch your condition.  Will get help right away if you are not doing well or get worse.   This information is  not intended to replace advice given to you by your health care provider. Make sure you discuss any questions you have with your health care provider.   Document Released: 06/12/2004 Document Revised: 02/20/2013 Document Reviewed: 09/21/2012 Elsevier Interactive Patient Education [employer]. Breastfeeding Deciding to breastfeed is one of the best choices you can make for you and your baby. A change in hormones during pregnancy causes your breast tissue to grow and increases the number and size of your milk ducts. These hormones also allow proteins, sugars, and fats from your blood supply to make breast milk in your milk-producing glands. Hormones prevent breast milk from being released before your baby is born as well as prompt milk flow after birth. Once breastfeeding has begun, thoughts of your baby, as well as his or her sucking or crying, can stimulate the release of milk from your milk-producing glands.  BENEFITS OF BREASTFEEDING For Your Baby  Your first milk (colostrum) helps your baby's digestive system function better.  There are antibodies in your milk that help your baby fight off infections.  Your baby has a lower incidence of asthma, allergies, and sudden infant death syndrome.  The nutrients in breast milk are better for your baby than infant formulas and are designed uniquely for your baby's needs.  Breast milk improves your baby's brain development.  Your baby is less likely to develop other conditions, such as childhood obesity, asthma, or type 2 diabetes mellitus. For  You  Breastfeeding helps to create a very special bond between you and your baby.  Breastfeeding is convenient. Breast milk is always available at the correct temperature and costs nothing.  Breastfeeding helps to burn calories and helps you lose the weight gained during pregnancy.  Breastfeeding makes your uterus contract to its prepregnancy size faster and slows bleeding (lochia) after you give birth.   Breastfeeding helps to lower your risk of developing type 2 diabetes mellitus, osteoporosis, and breast or ovarian cancer later in life. SIGNS THAT YOUR BABY IS HUNGRY Early Signs of Hunger  Increased alertness or activity.  Stretching.  Movement of the head from side to side.  Movement of the head and opening of the mouth when the corner of the mouth or cheek is stroked (rooting).  Increased sucking sounds, smacking lips, cooing, sighing, or squeaking.  Hand-to-mouth movements.  Increased sucking of fingers or hands. Late Signs of Hunger  Fussing.  Intermittent crying. Extreme Signs of Hunger Signs of extreme hunger will require calming and consoling before your baby will be able to breastfeed successfully. Do not wait for the following signs of extreme hunger to occur before you initiate breastfeeding:  Restlessness.  A loud, strong cry.  Screaming. BREASTFEEDING BASICS Breastfeeding Initiation  Find a comfortable place to sit or lie down, with your neck and back well supported.  Place a pillow or rolled up blanket under your baby to bring him or her to the level of your breast (if you are seated). Nursing pillows are specially designed to help support your arms and your baby while you breastfeed.  Make sure that your baby's abdomen is facing your abdomen.  Gently massage your breast. With your fingertips, massage from your chest wall toward your nipple in a circular motion. This encourages milk flow. You may need to continue this action during the feeding if your  milk flows slowly.  Support your breast with 4 fingers underneath and your thumb above your nipple. Make sure your fingers are well away from your nipple  and your baby's mouth.  Stroke your baby's lips gently with your finger or nipple.  When your baby's mouth is open wide enough, quickly bring your baby to your breast, placing your entire nipple and as much of the colored area around your nipple (areola) as possible into your baby's mouth.  More areola should be visible above your baby's upper lip than below the lower lip.  Your baby's tongue should be between his or her lower gum and your breast.  Ensure that your baby's mouth is correctly positioned around your nipple (latched). Your baby's lips should create a seal on your breast and be turned out (everted).  It is common for your baby to suck about 2-3 minutes in order to start the flow of breast milk. Latching Teaching your baby how to latch on to your breast properly is very important. An improper latch can cause nipple pain and decreased milk supply for you and poor weight gain in your baby. Also, if your baby is not latched onto your nipple properly, he or she may swallow some air during feeding. This can make your baby fussy. Burping your baby when you switch breasts during the feeding can help to get rid of the air. However, teaching your baby to latch on properly is still the best way to prevent fussiness from swallowing air while breastfeeding. Signs that your baby has successfully latched on to your nipple:  Silent tugging or silent sucking, without causing you pain.  Swallowing heard between every 3-4 sucks.  Muscle movement above and in front of his or her ears while sucking. Signs that your baby has not successfully latched on to nipple:  Sucking sounds or smacking sounds from your baby while breastfeeding.  Nipple pain. If you think your baby has not latched on correctly, slip your finger into the corner of your baby's  mouth to break the suction and place it between your baby's gums. Attempt breastfeeding initiation again. Signs of Successful Breastfeeding Signs from your baby:  A gradual decrease in the number of sucks or complete cessation of sucking.  Falling asleep.  Relaxation of his or her body.  Retention of a small amount of milk in his or her mouth.  Letting go of your breast by himself or herself. Signs from you:  Breasts that have increased in firmness, weight, and size 1-3 hours after feeding.  Breasts that are softer immediately after breastfeeding.  Increased milk volume, as well as a change in milk consistency and color by the fifth day of breastfeeding.  Nipples that are not sore, cracked, or bleeding. Signs That Your [employer] is Getting Enough Milk  Wetting at least 3 diapers in a 24-hour period. The urine should be clear and pale yellow by age 37 days.  At least 3 stools in a 24-hour period by age 37 days. The stool should be soft and yellow.  At least 3 stools in a 24-hour period by age 74 days. The stool should be seedy and yellow.  No loss of weight greater than 10% of birth weight during the first 28 days of age.  Average weight gain of 4-7 ounces (113-198 g) per week after age 53 days.  Consistent daily weight gain by age 531 days, without weight loss after the age of 2 weeks. After a feeding, your baby may spit up a small amount. This is common. BREASTFEEDING FREQUENCY AND DURATION Frequent feeding will help you make more milk and can prevent sore nipples and breast engorgement. Breastfeed  when you feel the need to reduce the fullness of your breasts or when your baby shows signs of hunger. This is called "breastfeeding on demand." Avoid introducing a pacifier to your baby while you are working to establish breastfeeding (the first 4-6 weeks after your baby is born). After this time you may choose to use a pacifier. Research has shown that pacifier use during the first year of a  baby's life decreases the risk of sudden infant death syndrome (SIDS). Allow your baby to feed on each breast as long as he or she wants. Breastfeed until your baby is finished feeding. When your baby unlatches or falls asleep while feeding from the first breast, offer the second breast. Because newborns are often sleepy in the first few weeks of life, you may need to awaken your baby to get him or her to feed. Breastfeeding times will vary from baby to baby. However, the following rules can serve as a guide to help you ensure that your baby is properly fed:  Newborns (babies 56 weeks of age or younger) may breastfeed every 1-3 hours.  Newborns should not go longer than 3 hours during the day or 5 hours during the night without breastfeeding.  You should breastfeed your baby a minimum of 8 times in a 24-hour period until you begin to introduce solid foods to your baby at around 61 months of age. BREAST MILK PUMPING Pumping and storing breast milk allows you to ensure that your baby is exclusively fed your breast milk, even at times when you are unable to breastfeed. This is especially important if you are going back to work while you are still breastfeeding or when you are not able to be present during feedings. Your lactation consultant can give you guidelines on how long it is safe to store breast milk. A breast pump is a machine that allows you to pump milk from your breast into a sterile bottle. The pumped breast milk can then be stored in a refrigerator or freezer. Some breast pumps are operated by hand, while others use electricity. Ask your lactation consultant which type will work best for you. Breast pumps can be purchased, but some hospitals and breastfeeding support groups lease breast pumps on a monthly basis. A lactation consultant can teach you how to hand express breast milk, if you prefer not to use a pump. CARING FOR YOUR BREASTS WHILE YOU BREASTFEED Nipples can become dry, cracked, and  sore while breastfeeding. The following recommendations can help keep your breasts moisturized and healthy:  Avoid using soap on your nipples.  Wear a supportive bra. Although not required, special nursing bras and tank tops are designed to allow access to your breasts for breastfeeding without taking off your entire bra or top. Avoid wearing underwire-style bras or extremely tight bras.  Air dry your nipples for 3-48mnutes after each feeding.  Use only cotton bra pads to absorb leaked breast milk. Leaking of breast milk between feedings is normal.  Use lanolin on your nipples after breastfeeding. Lanolin helps to maintain your skin's normal moisture barrier. If you use pure lanolin, you do not need to wash it off before feeding your baby again. Pure lanolin is not toxic to your baby. You may also hand express a few drops of breast milk and gently massage that milk into your nipples and allow the milk to air dry. In the first few weeks after giving birth, some women experience extremely full breasts (engorgement). Engorgement can make your breasts  feel heavy, warm, and tender to the touch. Engorgement peaks within 3-5 days after you give birth. The following recommendations can help ease engorgement:  Completely empty your breasts while breastfeeding or pumping. You may want to start by applying warm, moist heat (in the shower or with warm water-soaked hand towels) just before feeding or pumping. This increases circulation and helps the milk flow. If your baby does not completely empty your breasts while breastfeeding, pump any extra milk after he or she is finished.  Wear a snug bra (nursing or regular) or tank top for 1-2 days to signal your body to slightly decrease milk production.  Apply ice packs to your breasts, unless this is too uncomfortable for you.  Make sure that your baby is latched on and positioned properly while breastfeeding. If engorgement persists after 48 hours of following  these recommendations, contact your health care provider or a Science writer. OVERALL HEALTH CARE RECOMMENDATIONS WHILE BREASTFEEDING  Eat healthy foods. Alternate between meals and snacks, eating 3 of each per day. Because what you eat affects your breast milk, some of the foods may make your baby more irritable than usual. Avoid eating these foods if you are sure that they are negatively affecting your baby.  Drink milk, fruit juice, and water to satisfy your thirst (about 10 glasses a day).  Rest often, relax, and continue to take your prenatal vitamins to prevent fatigue, stress, and anemia.  Continue breast self-awareness checks.  Avoid chewing and smoking tobacco. Chemicals from cigarettes that pass into breast milk and exposure to secondhand smoke may harm your baby.  Avoid alcohol and drug use, including marijuana. Some medicines that may be harmful to your baby can pass through breast milk. It is important to ask your health care provider before taking any medicine, including all over-the-counter and prescription medicine as well as vitamin and herbal supplements. It is possible to become pregnant while breastfeeding. If birth control is desired, ask your health care provider about options that will be safe for your baby. SEEK MEDICAL CARE IF:  You feel like you want to stop breastfeeding or have become frustrated with breastfeeding.  You have painful breasts or nipples.  Your nipples are cracked or bleeding.  Your breasts are red, tender, or warm.  You have a swollen area on either breast.  You have a fever or chills.  You have nausea or vomiting.  You have drainage other than breast milk from your nipples.  Your breasts do not become full before feedings by the fifth day after you give birth.  You feel sad and depressed.  Your baby is too sleepy to eat well.  Your baby is having trouble sleeping.   Your baby is wetting less than 3 diapers in a 24-hour  period.  Your baby has less than 3 stools in a 24-hour period.  Your baby's skin or the white part of his or her eyes becomes yellow.   Your baby is not gaining weight by 68 days of age. SEEK IMMEDIATE MEDICAL CARE IF:  Your baby is overly tired (lethargic) and does not want to wake up and feed.  Your baby develops an unexplained fever.   This information is not intended to replace advice given to you by your health care provider. Make sure you discuss any questions you have with your health care provider.   Document Released: 02/15/2005 Document Revised: 11/06/2014 Document Reviewed: 08/09/2012 Elsevier Interactive Patient Education 2016 [place]. Postpartum Care After Vaginal Delivery After  you deliver your newborn (postpartum period), the usual stay in the hospital is 24-72 hours. If there were problems with your labor or delivery, or if you have other medical problems, you might be in the hospital longer.  While you are in the hospital, you will receive help and instructions on how to care for yourself and your newborn during the postpartum period.  While you are in the hospital:  Be sure to tell your nurses if you have pain or discomfort, as well as where you feel the pain and what makes the pain worse.  If you had an incision made near your vagina (episiotomy) or if you had some tearing during delivery, the nurses may put ice packs on your episiotomy or tear. The ice packs may help to reduce the pain and swelling.  If you are breastfeeding, you may feel uncomfortable contractions of your uterus for a couple of weeks. This is normal. The contractions help your uterus get back to normal size.  It is normal to have some bleeding after delivery.  For the first 1-3 days after delivery, the flow is red and the amount may be similar to a period.  It is common for the flow to start and stop.  In the first few days, you may pass some small clots. Let your nurses know if you begin  to pass large clots or your flow increases.  Do not  flush blood clots down the toilet before having the nurse look at them.  During the next 3-10 days after delivery, your flow should become more watery and pink or brown-tinged in color.  Ten to fourteen days after delivery, your flow should be a small amount of yellowish-white discharge.  The amount of your flow will decrease over the first few weeks after delivery. Your flow may stop in 6-8 weeks. Most women have had their flow stop by 12 weeks after delivery.  You should change your sanitary pads frequently.  Wash your hands thoroughly with soap and water for at least 20 seconds after changing pads, using the toilet, or before holding or feeding your newborn.  You should feel like you need to empty your bladder within the first 6-8 hours after delivery.  In case you become weak, lightheaded, or faint, call your nurse before you get out of bed for the first time and before you take a shower for the first time.  Within the first few days after delivery, your breasts may begin to feel tender and full. This is called engorgement. Breast tenderness usually goes away within 48-72 hours after engorgement occurs. You may also notice milk leaking from your breasts. If you are not breastfeeding, do not stimulate your breasts. Breast stimulation can make your breasts produce more milk.  Spending as much time as possible with your newborn is very important. During this time, you and your newborn can feel close and get to know each other. Having your newborn stay in your room (rooming in) will help to strengthen the bond with your newborn. It will give you time to get to know your newborn and become comfortable caring for your newborn.  Your hormones change after delivery. Sometimes the hormone changes can temporarily cause you to feel sad or tearful. These feelings should not last more than a few days. If these feelings last longer than that, you  should talk to your caregiver.  If desired, talk to your caregiver about methods of family planning or contraception.  Talk to your caregiver  about immunizations. Your caregiver may want you to have the following immunizations before leaving the hospital:  Tetanus, diphtheria, and pertussis (Tdap) or tetanus and diphtheria (Td) immunization. It is very important that you and your family (including grandparents) or others caring for your newborn are up-to-date with the Tdap or Td immunizations. The Tdap or Td immunization can help protect your newborn from getting ill.  Rubella immunization.  Varicella (chickenpox) immunization.  Influenza immunization. You should receive this annual immunization if you did not receive the immunization during your pregnancy.   This information is not intended to replace advice given to you by your health care provider. Make sure you discuss any questions you have with your health care provider.   Document Released: 12/13/2006 Document Revised: 11/10/2011 Document Reviewed: 10/13/2011 Elsevier Interactive Patient Education [employer].

## 2015-11-14 LAB — GLUCOSE, POCT (MANUAL RESULT ENTRY): POC Glucose: 95 mg/dl (ref 70–99)

## 2016-02-12 DIAGNOSIS — E049 Nontoxic goiter, unspecified: Secondary | ICD-10-CM | POA: Diagnosis not present

## 2016-02-12 DIAGNOSIS — Z6827 Body mass index (BMI) 27.0-27.9, adult: Secondary | ICD-10-CM | POA: Diagnosis not present

## 2016-02-12 DIAGNOSIS — Z01419 Encounter for gynecological examination (general) (routine) without abnormal findings: Secondary | ICD-10-CM | POA: Diagnosis not present

## 2016-02-12 DIAGNOSIS — Z113 Encounter for screening for infections with a predominantly sexual mode of transmission: Secondary | ICD-10-CM | POA: Diagnosis not present

## 2016-02-12 DIAGNOSIS — Z3040 Encounter for surveillance of contraceptives, unspecified: Secondary | ICD-10-CM | POA: Diagnosis not present

## 2016-02-12 DIAGNOSIS — Z139 Encounter for screening, unspecified: Secondary | ICD-10-CM | POA: Diagnosis not present

## 2016-02-12 DIAGNOSIS — Z124 Encounter for screening for malignant neoplasm of cervix: Secondary | ICD-10-CM | POA: Diagnosis not present

## 2016-02-12 LAB — OB RESULTS CONSOLE HEPATITIS B SURFACE ANTIGEN: HEP B S AG: NEGATIVE

## 2016-02-12 LAB — OB RESULTS CONSOLE HIV ANTIBODY (ROUTINE TESTING)
HIV: NONREACTIVE
HIV: NONREACTIVE

## 2016-02-12 LAB — OB RESULTS CONSOLE GC/CHLAMYDIA
CHLAMYDIA, DNA PROBE: NEGATIVE
CHLAMYDIA, DNA PROBE: NEGATIVE
GC PROBE AMP, GENITAL: NEGATIVE
GC PROBE AMP, GENITAL: NEGATIVE

## 2016-02-12 LAB — OB RESULTS CONSOLE TSH
TSH: 0.54
TSH: 0.54

## 2016-02-12 LAB — OB RESULTS CONSOLE RPR
RPR: NONREACTIVE
RPR: NONREACTIVE

## 2016-03-01 NOTE — L&D Delivery Note (Signed)
Patient is a 21 y.o. now G2P2002 s/p NSVD at 7520w6d.She was admitted for active labor, initial SVE 6-7cm/90%, had SROM at 23:48 and SVD at 23:55 on 11/07/16.  Delivery Note At 11:55 PM on 11/07/16 a viable female was delivered via Vaginal, Spontaneous Delivery Presentation: ROA  APGAR: 9, 9; weight pending Placenta status: intact; to L&D Cord:  3-vessel  Anesthesia: none Episiotomy: None Lacerations: 1st degree Suture Repair: 3.0 vicryl; lidocaine for local anesthesia  Est. Blood Loss (mL): 350  Patient SROM at 23:48, feeling strong urge to push. On SVE found to be complete and and +2. With next two contractions and maternal pushing, head delivered ROA. Nuchal cord x1 present, which was reduced. Shoulder and body delivered in usual fashion. Infant to mother's abdomen. Cord clamped x 2 after 1-minute delay, and cut by family member. Cord blood drawn. Placenta delivered spontaneously with gentle cord traction. Fundus firm with massage and Pitocin, but continuous trickling of blood noted. Lower uterine segment found to have some clot, which were cleared. Perineum inspected and found to have first degree laceration, which was repaired with 3.0 vycril with good hemostasis achieved.  Mom to postpartum.  Baby to Couplet care / Skin to Skin.  Raynelle FanningJulie P. Elmar Antigua, MD OB Fellow 11/08/16, 1:17 AM

## 2016-03-30 ENCOUNTER — Other Ambulatory Visit: Payer: Self-pay | Admitting: Obstetrics and Gynecology

## 2016-03-30 DIAGNOSIS — E049 Nontoxic goiter, unspecified: Secondary | ICD-10-CM

## 2016-04-12 ENCOUNTER — Encounter: Payer: Self-pay | Admitting: *Deleted

## 2016-04-12 ENCOUNTER — Other Ambulatory Visit: Payer: BLUE CROSS/BLUE SHIELD

## 2016-04-14 ENCOUNTER — Other Ambulatory Visit (HOSPITAL_COMMUNITY)
Admission: RE | Admit: 2016-04-14 | Discharge: 2016-04-14 | Disposition: A | Discharge status: Home / Self Care | Payer: BLUE CROSS/BLUE SHIELD | Source: Ambulatory Visit | Attending: Obstetrics and Gynecology | Admitting: Obstetrics and Gynecology

## 2016-04-14 ENCOUNTER — Encounter: Payer: Self-pay | Admitting: Obstetrics and Gynecology

## 2016-04-14 ENCOUNTER — Ambulatory Visit (INDEPENDENT_AMBULATORY_CARE_PROVIDER_SITE_OTHER): Payer: BLUE CROSS/BLUE SHIELD | Admitting: Obstetrics and Gynecology

## 2016-04-14 VITALS — BP 118/79 | HR 112 | Ht 69.0 in | Wt 181.0 lb

## 2016-04-14 DIAGNOSIS — Z113 Encounter for screening for infections with a predominantly sexual mode of transmission: Secondary | ICD-10-CM | POA: Diagnosis not present

## 2016-04-14 DIAGNOSIS — Z3481 Encounter for supervision of other normal pregnancy, first trimester: Secondary | ICD-10-CM

## 2016-04-14 DIAGNOSIS — Z3490 Encounter for supervision of normal pregnancy, unspecified, unspecified trimester: Secondary | ICD-10-CM | POA: Diagnosis not present

## 2016-04-14 DIAGNOSIS — O26899 Other specified pregnancy related conditions, unspecified trimester: Secondary | ICD-10-CM

## 2016-04-14 DIAGNOSIS — O09899 Supervision of other high risk pregnancies, unspecified trimester: Secondary | ICD-10-CM | POA: Insufficient documentation

## 2016-04-14 DIAGNOSIS — O99340 Other mental disorders complicating pregnancy, unspecified trimester: Secondary | ICD-10-CM

## 2016-04-14 DIAGNOSIS — Z6791 Unspecified blood type, Rh negative: Secondary | ICD-10-CM

## 2016-04-14 DIAGNOSIS — Z22330 Carrier of Group B streptococcus: Secondary | ICD-10-CM

## 2016-04-14 DIAGNOSIS — O99341 Other mental disorders complicating pregnancy, first trimester: Secondary | ICD-10-CM

## 2016-04-14 DIAGNOSIS — F329 Major depressive disorder, single episode, unspecified: Secondary | ICD-10-CM

## 2016-04-14 NOTE — Progress Notes (Signed)
NOB visit. [facility] declines [facility]

## 2016-04-14 NOTE — Progress Notes (Signed)
Subjective:  Diane [name] is a 21 y.o. G2P1001 at [redacted]w[redacted]d being seen today for her first OB visit. She has no complaints. Unknown LMP and breastfeeding at time of conceiving.  H/O depression, stable on Zoloft. 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  Patient reports no complaints.  Contractions: Not present. Vag. Bleeding: None.  Movement: Absent. Denies leaking of fluid.   The following portions of the patient's history were reviewed and updated as appropriate: allergies, current medications, past family history, past medical history, past social history, past surgical history and problem list. Problem list updated.  Objective:   Vitals:   04/14/16 0856 04/14/16 0857  BP: 118/79   Pulse: (!) 112   Weight: 181 lb (82.1 kg)   Height:  5\' 9"  (1.753 m)    Fetal Status: Fetal Heart Rate (bpm): 165   Movement: Absent     General:  Alert, oriented and cooperative. Patient is in no acute distress.  Skin: Skin is warm and dry. No rash noted.   Cardiovascular: Normal heart rate noted  Respiratory: Normal respiratory effort, no problems with respiration noted  Abdomen: Soft, gravid, appropriate for gestational age. Pain/Pressure: Absent     Pelvic:  Cervical exam performed        Extremities: Normal range of motion.  Edema: None  Mental Status: Normal mood and affect. Normal behavior. Normal judgment and thought content.   Urinalysis:      Assessment and Plan:  Pregnancy: G2P1001 at [redacted]w[redacted]d  1. Encounter for supervision of normal pregnancy, antepartum, unspecified gravidity Prenatal care/labs reviewed  with pt. Advised to stopped breast feeding. - Obstetric Panel, Including HIV - Hemoglobinopathy evaluation - Varicella zoster antibody, IgG - ToxASSURE Select 13 (MW), Urine - US MFM Fetal Nuchal Translucency; Future - Culture, OB Urine - Cytology - PAP - Urine cytology ancillary only  2. 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 Depression affecting pregnancy, antepartum--on Zoloft Continue with Zoloft  4. Rh negative state in antepartum period Rhogam as indicated  5. Short interval between pregnancies affecting pregnancy, antepartum PTL/PTD/LBW reviewed with pt  Preterm labor symptoms and general obstetric precautions including but not limited to vaginal bleeding, contractions, leaking of fluid and fetal movement were reviewed in detail with the patient. Please refer to After Visit Summary for other counseling recommendations.  Return in about 4 weeks (around 05/12/2016) for OB visit.   Hermina StaggersMichael L Orbin Mayeux, MD

## 2016-04-14 NOTE — Patient Instructions (Signed)
First Trimester of Pregnancy  The first trimester of pregnancy is from week 1 until the end of week 12 (months 1 through 3). A week after a sperm fertilizes an egg, the egg will implant on the wall of the uterus. This embryo will begin to develop into a baby. Genes from you and your partner are forming the baby. The female genes determine whether the baby is a boy or a girl. At 6-8 weeks, the eyes and face are formed, and the heartbeat can be seen on ultrasound. At the end of 12 weeks, all the baby's organs are formed.   Now that you are pregnant, you will want to do everything you can to have a healthy baby. Two of the most important things are to get good prenatal care and to follow your health care provider's instructions. Prenatal care is all the medical care you receive before the baby's birth. This care will help prevent, find, and treat any problems during the pregnancy and childbirth.  BODY CHANGES  Your body goes through many changes during pregnancy. The changes vary from woman to woman.   · You may gain or lose a couple of pounds at first.  · You may feel sick to your stomach (nauseous) and throw up (vomit). If the vomiting is uncontrollable, call your health care provider.  · You may tire easily.  · You may develop headaches that can be relieved by medicines approved by your health care provider.  · You may urinate more often. Painful urination may mean you have a bladder infection.  · You may develop heartburn as a result of your pregnancy.  · You may develop constipation because certain hormones are causing the muscles that push waste through your intestines to slow down.  · You may develop hemorrhoids or swollen, bulging veins (varicose veins).  · Your breasts may begin to grow larger and become tender. Your nipples may stick out more, and the tissue that surrounds them (areola) may become darker.  · Your gums may bleed and may be sensitive to brushing and flossing.   · Dark spots or blotches (chloasma, mask of pregnancy) may develop on your face. This will likely fade after the baby is born.  · Your menstrual periods will stop.  · You may have a loss of appetite.  · You may develop cravings for certain kinds of food.  · You may have changes in your emotions from day to day, such as being excited to be pregnant or being concerned that something may go wrong with the pregnancy and baby.  · You may have more vivid and strange dreams.  · You may have changes in your hair. These can include thickening of your hair, rapid growth, and changes in texture. Some women also have hair loss during or after pregnancy, or hair that feels dry or thin. Your hair will most likely return to normal after your baby is born.  WHAT TO EXPECT AT YOUR PRENATAL VISITS  During a routine prenatal visit:  · You will be weighed to make sure you and the baby are growing normally.  · Your blood pressure will be taken.  · Your abdomen will be measured to track your baby's growth.  · The fetal heartbeat will be listened to starting around week 10 or 12 of your pregnancy.  · Test results from any previous visits will be discussed.  Your health care provider may ask you:  · How you are feeling.  · If you   are feeling the baby move.  · If you have had any abnormal symptoms, such as leaking fluid, bleeding, severe headaches, or abdominal cramping.  · If you are using any tobacco products, including cigarettes, chewing tobacco, and electronic cigarettes.  · If you have any questions.  Other tests that may be performed during your first trimester include:  · Blood tests to find your blood type and to check for the presence of any previous infections. They will also be used to check for low iron levels (anemia) and Rh antibodies. Later in the pregnancy, blood tests for diabetes will be done along with other tests if problems develop.  · Urine tests to check for infections, diabetes, or protein in the urine.   · An ultrasound to confirm the proper growth and development of the baby.  · An amniocentesis to check for possible genetic problems.  · Fetal screens for spina bifida and Down syndrome.  · You may need other tests to make sure you and the baby are doing well.  · HIV (human immunodeficiency virus) testing. Routine prenatal testing includes screening for HIV, unless you choose not to have this test.  HOME CARE INSTRUCTIONS   Medicines  · Follow your health care provider's instructions regarding medicine use. Specific medicines may be either safe or unsafe to take during pregnancy.  · Take your prenatal vitamins as directed.  · If you develop constipation, try taking a stool softener if your health care provider approves.  Diet  · Eat regular, well-balanced meals. Choose a variety of foods, such as meat or vegetable-based protein, fish, milk and low-fat dairy products, vegetables, fruits, and whole grain breads and cereals. Your health care provider will help you determine the amount of weight gain that is right for you.  · Avoid raw meat and uncooked cheese. These carry germs that can cause birth defects in the baby.  · Eating four or five small meals rather than three large meals a day may help relieve nausea and vomiting. If you start to feel nauseous, eating a few soda crackers can be helpful. Drinking liquids between meals instead of during meals also seems to help nausea and vomiting.  · If you develop constipation, eat more high-fiber foods, such as fresh vegetables or fruit and whole grains. Drink enough fluids to keep your urine clear or pale yellow.  Activity and Exercise  · Exercise only as directed by your health care provider. Exercising will help you:    Control your weight.    Stay in shape.    Be prepared for labor and delivery.  · Experiencing pain or cramping in the lower abdomen or low back is a good sign that you should stop exercising. Check with your health care provider  before continuing normal exercises.  · Try to avoid standing for long periods of time. Move your legs often if you must stand in one place for a long time.  · Avoid heavy lifting.  · Wear low-heeled shoes, and practice good posture.  · You may continue to have sex unless your health care provider directs you otherwise.  Relief of Pain or Discomfort  · Wear a good support bra for breast tenderness.    · Take warm sitz baths to soothe any pain or discomfort caused by hemorrhoids. Use hemorrhoid cream if your health care provider approves.    · Rest with your legs elevated if you have leg cramps or low back pain.  · If you develop varicose veins in your   legs, wear support hose. Elevate your feet for 15 minutes, 3-4 times a day. Limit salt in your diet.  Prenatal Care  · Schedule your prenatal visits by the twelfth week of pregnancy. They are usually scheduled monthly at first, then more often in the last 2 months before delivery.  · Write down your questions. Take them to your prenatal visits.  · Keep all your prenatal visits as directed by your health care provider.  Safety  · Wear your seat belt at all times when driving.  · Make a list of emergency phone numbers, including numbers for family, friends, the hospital, and police and fire departments.  General Tips  · Ask your health care provider for a referral to a local prenatal education class. Begin classes no later than at the beginning of month 6 of your pregnancy.  · Ask for help if you have counseling or nutritional needs during pregnancy. Your health care provider can offer advice or refer you to specialists for help with various needs.  · Do not use hot tubs, steam rooms, or saunas.  · Do not douche or use tampons or scented sanitary pads.  · Do not cross your legs for long periods of time.  · Avoid cat litter boxes and soil used by cats. These carry germs that can cause birth defects in the baby and possibly loss of the fetus by miscarriage or stillbirth.   · Avoid all smoking, herbs, alcohol, and medicines not prescribed by your health care provider. Chemicals in these affect the formation and growth of the baby.  · Do not use any tobacco products, including cigarettes, chewing tobacco, and electronic cigarettes. If you need help quitting, ask your health care provider. You may receive counseling support and other resources to help you quit.  · Schedule a dentist appointment. At home, brush your teeth with a soft toothbrush and be gentle when you floss.  SEEK MEDICAL CARE IF:   · You have dizziness.  · You have mild pelvic cramps, pelvic pressure, or nagging pain in the abdominal area.  · You have persistent nausea, vomiting, or diarrhea.  · You have a bad smelling vaginal discharge.  · You have pain with urination.  · You notice increased swelling in your face, hands, legs, or ankles.  SEEK IMMEDIATE MEDICAL CARE IF:   · You have a fever.  · You are leaking fluid from your vagina.  · You have spotting or bleeding from your vagina.  · You have severe abdominal cramping or pain.  · You have rapid weight gain or loss.  · You vomit blood or material that looks like coffee grounds.  · You are exposed to German measles and have never had them.  · You are exposed to fifth disease or chickenpox.  · You develop a severe headache.  · You have shortness of breath.  · You have any kind of trauma, such as from a fall or a car accident.     This information is not intended to replace advice given to you by your health care provider. Make sure you discuss any questions you have with your health care provider.     Document Released: 02/09/2001 Document Revised: 03/08/2014 Document Reviewed: 12/26/2012  Elsevier Interactive Patient Education ©2017 [employer]

## 2016-04-15 ENCOUNTER — Emergency Department (HOSPITAL_COMMUNITY)
Admission: EM | Admit: 2016-04-15 | Discharge: 2016-04-15 | Disposition: A | Discharge status: Home / Self Care | Payer: BLUE CROSS/BLUE SHIELD | Attending: Dermatology | Admitting: Dermatology

## 2016-04-15 ENCOUNTER — Encounter (HOSPITAL_COMMUNITY): Payer: Self-pay

## 2016-04-15 DIAGNOSIS — O209 Hemorrhage in early pregnancy, unspecified: Secondary | ICD-10-CM | POA: Insufficient documentation

## 2016-04-15 DIAGNOSIS — Z5321 Procedure and treatment not carried out due to patient leaving prior to being seen by health care provider: Secondary | ICD-10-CM | POA: Diagnosis not present

## 2016-04-15 DIAGNOSIS — Z3A09 9 weeks gestation of pregnancy: Secondary | ICD-10-CM | POA: Diagnosis not present

## 2016-04-15 LAB — URINALYSIS, ROUTINE W REFLEX MICROSCOPIC
Bilirubin Urine: NEGATIVE
Glucose, UA: NEGATIVE mg/dL
HGB URINE DIPSTICK: NEGATIVE
Ketones, ur: NEGATIVE mg/dL
Nitrite: NEGATIVE
PROTEIN: 30 mg/dL — AB
Specific Gravity, Urine: 1.031 — ABNORMAL HIGH (ref 1.005–1.030)
pH: 6 (ref 5.0–8.0)

## 2016-04-15 LAB — CYTOLOGY - PAP: DIAGNOSIS: NEGATIVE

## 2016-04-15 LAB — PREGNANCY, URINE: PREG TEST UR: POSITIVE — AB

## 2016-04-15 NOTE — ED Notes (Signed)
Pt informed registration clerk that she was leaving.

## 2016-04-15 NOTE — ED Triage Notes (Signed)
I think I am having a miscarriage.  I am having cramping and bleeding.  I thought I was about 11 weeks, but the ultrasound I had yesterday said I was about 9 weeks.  The ultrasound was normal yesterday.  I have passed one blood clot, no tissue.

## 2016-04-16 ENCOUNTER — Inpatient Hospital Stay (HOSPITAL_COMMUNITY): Payer: BLUE CROSS/BLUE SHIELD

## 2016-04-16 ENCOUNTER — Encounter (HOSPITAL_COMMUNITY): Payer: Self-pay | Admitting: *Deleted

## 2016-04-16 ENCOUNTER — Inpatient Hospital Stay (HOSPITAL_COMMUNITY)
Admission: AD | Admit: 2016-04-16 | Discharge: 2016-04-16 | Disposition: A | Discharge status: Home / Self Care | Payer: BLUE CROSS/BLUE SHIELD | Source: Ambulatory Visit | Attending: Obstetrics & Gynecology | Admitting: Obstetrics & Gynecology

## 2016-04-16 DIAGNOSIS — B373 Candidiasis of vulva and vagina: Secondary | ICD-10-CM | POA: Insufficient documentation

## 2016-04-16 DIAGNOSIS — Z3A09 9 weeks gestation of pregnancy: Secondary | ICD-10-CM | POA: Insufficient documentation

## 2016-04-16 DIAGNOSIS — B3731 Acute candidiasis of vulva and vagina: Secondary | ICD-10-CM

## 2016-04-16 DIAGNOSIS — Z3490 Encounter for supervision of normal pregnancy, unspecified, unspecified trimester: Secondary | ICD-10-CM

## 2016-04-16 DIAGNOSIS — O209 Hemorrhage in early pregnancy, unspecified: Secondary | ICD-10-CM

## 2016-04-16 DIAGNOSIS — A599 Trichomoniasis, unspecified: Secondary | ICD-10-CM

## 2016-04-16 DIAGNOSIS — A5901 Trichomonal vulvovaginitis: Secondary | ICD-10-CM | POA: Insufficient documentation

## 2016-04-16 DIAGNOSIS — O98311 Other infections with a predominantly sexual mode of transmission complicating pregnancy, first trimester: Secondary | ICD-10-CM | POA: Insufficient documentation

## 2016-04-16 DIAGNOSIS — O98811 Other maternal infectious and parasitic diseases complicating pregnancy, first trimester: Secondary | ICD-10-CM | POA: Diagnosis not present

## 2016-04-16 DIAGNOSIS — O26851 Spotting complicating pregnancy, first trimester: Secondary | ICD-10-CM | POA: Diagnosis not present

## 2016-04-16 LAB — CBC
HEMATOCRIT: 34.8 % — AB (ref 36.0–46.0)
HEMOGLOBIN: 12.3 g/dL (ref 12.0–15.0)
MCH: 30.1 pg (ref 26.0–34.0)
MCHC: 35.3 g/dL (ref 30.0–36.0)
MCV: 85.3 fL (ref 78.0–100.0)
Platelets: 265 10*3/uL (ref 150–400)
RBC: 4.08 MIL/uL (ref 3.87–5.11)
RDW: 12.5 % (ref 11.5–15.5)
WBC: 6.7 10*3/uL (ref 4.0–10.5)

## 2016-04-16 LAB — OBSTETRIC PANEL, INCLUDING HIV
ANTIBODY SCREEN: NEGATIVE
BASOS ABS: 0 10*3/uL (ref 0.0–0.2)
BASOS: 0 %
EOS (ABSOLUTE): 0.1 10*3/uL (ref 0.0–0.4)
Eos: 1 %
HIV SCREEN 4TH GENERATION: NONREACTIVE
Hematocrit: 39.6 % (ref 34.0–46.6)
Hemoglobin: 13.1 g/dL (ref 11.1–15.9)
Hepatitis B Surface Ag: NEGATIVE
IMMATURE GRANS (ABS): 0 10*3/uL (ref 0.0–0.1)
Immature Granulocytes: 0 %
LYMPHS: 20 %
Lymphocytes Absolute: 1.3 10*3/uL (ref 0.7–3.1)
MCH: 29.4 pg (ref 26.6–33.0)
MCHC: 33.1 g/dL (ref 31.5–35.7)
MCV: 89 fL (ref 79–97)
MONOS ABS: 0.6 10*3/uL (ref 0.1–0.9)
Monocytes: 8 %
NEUTROS ABS: 4.7 10*3/uL (ref 1.4–7.0)
Neutrophils: 71 %
PLATELETS: 279 10*3/uL (ref 150–379)
RBC: 4.46 x10E6/uL (ref 3.77–5.28)
RDW: 13.4 % (ref 12.3–15.4)
RPR Ser Ql: NONREACTIVE
Rh Factor: NEGATIVE
Rubella Antibodies, IGG: 7.42 index (ref >0.99)
WBC: 6.6 10*3/uL (ref 3.4–10.8)

## 2016-04-16 LAB — HEMOGLOBINOPATHY EVALUATION
HEMOGLOBIN F QUANTITATION: 1 % (ref 0.0–2.0)
HGB C: 0 %
HGB S: 0 %
HGB VARIANT: 0 %
Hemoglobin A2 Quantitation: 2.5 % (ref 1.8–3.2)
Hgb A: 96.5 % (ref 96.4–98.8)

## 2016-04-16 LAB — WET PREP, GENITAL
CLUE CELLS WET PREP: NONE SEEN
SPERM: NONE SEEN

## 2016-04-16 LAB — VARICELLA ZOSTER ANTIBODY, IGG: VARICELLA: 1514 {index} (ref >165)

## 2016-04-16 LAB — URINE CYTOLOGY ANCILLARY ONLY
Chlamydia: NEGATIVE
Neisseria Gonorrhea: NEGATIVE

## 2016-04-16 LAB — HCG, QUANTITATIVE, PREGNANCY: hCG, Beta Chain, Quant, S: 257523 m[IU]/mL — ABNORMAL HIGH (ref <5)

## 2016-04-16 LAB — ABO/RH: ABO/RH(D): B NEG

## 2016-04-16 MED ORDER — METRONIDAZOLE 500 MG PO TABS
2000.0000 mg | ORAL_TABLET | Freq: Once | ORAL | Status: AC
  Administered 2016-04-16: 2000 mg via ORAL
  Filled 2016-04-16: qty 4

## 2016-04-16 MED ORDER — TERCONAZOLE 0.4 % VA CREA: 1.0000 | TOPICAL_CREAM | Freq: Every day | VAGINAL | 0 refills | Status: DC

## 2016-04-16 NOTE — Discharge Instructions (Signed)
Vaginal Yeast infection, Adult Vaginal yeast infection is a condition that causes soreness, swelling, and redness (inflammation) of the vagina. It also causes vaginal discharge. This is a common condition. Some women get this infection frequently. What are the causes? This condition is caused by a change in the normal balance of the yeast (candida) and bacteria that live in the vagina. This change causes an overgrowth of yeast, which causes the inflammation. What increases the risk? This condition is more likely to develop in:  Women who take antibiotic medicines.  Women who have diabetes.  Women who take birth control pills.  Women who are pregnant.  Women who douche often.  Women who have a weak defense (immune) system.  Women who have been taking steroid medicines for a long time.  Women who frequently wear tight clothing. What are the signs or symptoms? Symptoms of this condition include:  White, thick vaginal discharge.  Swelling, itching, redness, and irritation of the vagina. The lips of the vagina (vulva) may be affected as well.  Pain or a burning feeling while urinating.  Pain during sex. How is this diagnosed? This condition is diagnosed with a medical history and physical exam. This will include a pelvic exam. Your health care provider will examine a sample of your vaginal discharge under a microscope. Your health care provider may send this sample for testing to confirm the diagnosis. How is this treated? This condition is treated with medicine. Medicines may be over-the-counter or prescription. You may be told to use one or more of the following:  Medicine that is taken orally.  Medicine that is applied as a cream.  Medicine that is inserted directly into the vagina (suppository). Follow these instructions at home:  Take or apply over-the-counter and prescription medicines only as told by your health care provider.  Do not have sex until your health care  provider has approved. Tell your sex partner that you have a yeast infection. That person should go to his or her health care provider if he or she develops symptoms.  Do not wear tight clothes, such as pantyhose or tight pants.  Avoid using tampons until your health care provider approves.  Eat more yogurt. This may help to keep your yeast infection from returning.  Try taking a sitz bath to help with discomfort. This is a warm water bath that is taken while you are sitting down. The water should only come up to your hips and should cover your buttocks. Do this 3-4 times per day or as told by your health care provider.  Do not douche.  Wear breathable, cotton underwear.  If you have diabetes, keep your blood sugar levels under control. Contact a health care provider if:  You have a fever.  Your symptoms go away and then return.  Your symptoms do not get better with treatment.  Your symptoms get worse.  You have new symptoms.  You develop blisters in or around your vagina.  You have blood coming from your vagina and it is not your menstrual period.  You develop pain in your abdomen. This information is not intended to replace advice given to you by your health care provider. Make sure you discuss any questions you have with your health care provider. Document Released: 11/25/2004 Document Revised: 07/30/2015 Document Reviewed: 08/19/2014 Elsevier Interactive Patient Education  2017 [employer] Trichomoniasis Trichomoniasis is an infection caused by an organism called Trichomonas. The infection can affect both women and men. In women, the outer female  genitalia and the vagina are affected. In men, the penis is mainly affected, but the prostate and other reproductive organs can also be involved. Trichomoniasis is a sexually transmitted infection (STI) and is most often passed to another person through sexual contact.  RISK FACTORS  Having unprotected sexual  intercourse.  Having sexual intercourse with an infected partner. SIGNS AND SYMPTOMS  Symptoms of trichomoniasis in women include:  Abnormal gray-green frothy vaginal discharge.  Itching and irritation of the vagina.  Itching and irritation of the area outside the vagina. Symptoms of trichomoniasis in men include:   Penile discharge with or without pain.  Pain during urination. This results from inflammation of the urethra. DIAGNOSIS  Trichomoniasis may be found during a Pap test or physical exam. Your health care provider may use one of the following methods to help diagnose this infection:  Testing the pH of the vagina with a test tape.  Using a vaginal swab test that checks for the Trichomonas organism. A test is available that provides results within a few minutes.  Examining a urine sample.  Testing vaginal secretions. Your health care provider may test you for other STIs, including HIV. TREATMENT   You may be given medicine to fight the infection. Women should inform their health care provider if they could be or are pregnant. Some medicines used to treat the infection should not be taken during pregnancy.  Your health care provider may recommend over-the-counter medicines or creams to decrease itching or irritation.  Your sexual partner will need to be treated if infected.  Your health care provider may test you for infection again 3 months after treatment. HOME CARE INSTRUCTIONS   Take medicines only as directed by your health care provider.  Take over-the-counter medicine for itching or irritation as directed by your health care provider.  Do not have sexual intercourse while you have the infection.  Women should not douche or wear tampons while they have the infection.  Discuss your infection with your partner. Your partner may have gotten the infection from you, or you may have gotten it from your partner.  Have your sex partner get examined and treated if  necessary.  Practice safe, informed, and protected sex.  See your health care provider for other STI testing. SEEK MEDICAL CARE IF:   You still have symptoms after you finish your medicine.  You develop abdominal pain.  You have pain when you urinate.  You have bleeding after sexual intercourse.  You develop a rash.  Your medicine makes you sick or makes you throw up (vomit). MAKE SURE YOU:  Understand these instructions.  Will watch your condition.  Will get help right away if you are not doing well or get worse. This information is not intended to replace advice given to you by your health care provider. Make sure you discuss any questions you have with your health care provider. Document Released: 08/11/2000 Document Revised: 03/08/2014 Document Reviewed: 11/27/2012 Elsevier Interactive Patient Education  2017 [employer].

## 2016-04-16 NOTE — MAU Note (Signed)
Pt reports some spotting and cramping since 5pm. One quarter sized blood clot at home per pt report. No active bleeding @ this time. + Vaginal itching and discharge.

## 2016-04-16 NOTE — MAU Provider Note (Signed)
History     CSN: [id number]  Arrival date and time: 04/16/16 0001   First Provider Initiated Contact with Patient 04/16/16 0014      Chief Complaint  Patient presents with  . Vaginal Bleeding   Vaginal Bleeding  The patient's primary symptoms include pelvic pain and vaginal bleeding. The patient's pertinent negatives include no vaginal discharge. This is a new problem. The current episode started today. The problem occurs constantly. The problem has been unchanged. Pain severity now: 8/10  The problem affects both sides. She is pregnant. Associated symptoms include nausea. Pertinent negatives include no abdominal pain, chills, dysuria, fever, frequency, urgency or vomiting. The vaginal discharge was bloody. The vaginal bleeding is spotting. She has been passing clots (passed one clot about the size of a quarter earlier today, but none since. ). She has not been passing tissue. Nothing aggravates the symptoms. She has tried nothing for the symptoms. Her menstrual history has been irregular (LMP: 01/24/16 ).   Past Medical History:  Diagnosis Date  . Anemia   . Anxiety   . Headache   . UTI (lower urinary tract infection)     No past surgical history on file.  Family History  Problem Relation Age of Onset  . Hypertension Mother   . Hypertension Maternal Grandmother   . Diabetes Maternal Grandmother   . Congestive Heart Failure Maternal Grandmother   . Hypertension Maternal Uncle   . Diabetes Maternal Uncle   . Hypertension Maternal Uncle   . Diabetes Maternal Uncle     Social History  Substance Use Topics  . Smoking status: Never Smoker  . Smokeless tobacco: Never Used  . Alcohol use No    Allergies:  Allergies  Allergen Reactions  . Cefzil [Cefprozil] Hives  . Rocephin [Ceftriaxone Sodium In Dextrose] Hives    Prescriptions Prior to Admission  Medication Sig Dispense Refill Last Dose  . acetaminophen (TYLENOL) 500 MG tablet Take 1,000 mg by mouth every 6 (six)  hours as needed for moderate pain.   Taking  . Prenatal Vit-Fe Fumarate-FA (PRENATAL MULTIVITAMIN) TABS tablet Take 1 tablet by mouth daily at 12 noon.   Taking  . sertraline (ZOLOFT) 25 MG tablet Take 50 mg by mouth daily.    Taking    Review of Systems  Constitutional: Negative for chills and fever.  Gastrointestinal: Positive for nausea. Negative for abdominal pain and vomiting.  Genitourinary: Positive for pelvic pain and vaginal bleeding. Negative for dysuria, frequency, urgency and vaginal discharge.   Physical Exam   Blood pressure 111/76, pulse 91, temperature 98.1 F (36.7 C), temperature source Oral, resp. rate 18, last menstrual period 01/24/2016, not currently breastfeeding.  Physical Exam  Nursing note and vitals reviewed. Constitutional: She is oriented to person, place, and time. She appears well-developed and well-nourished. No distress.  HENT:  Head: Normocephalic.  Cardiovascular: Normal rate.   Respiratory: Effort normal.  GI: Soft. There is no tenderness. There is no rebound.  Genitourinary:  Genitourinary Comments:  External: no lesion Vagina: small amount of thick, white, adherent discharge Cervix: pink, smooth, no CMT Uterus: NSSC Adnexa: NT   Neurological: She is alert and oriented to person, place, and time.  Skin: Skin is warm and dry.  Psychiatric: She has a normal mood and affect.   Results for orders placed or performed during the hospital encounter of 04/16/16 (from the past 24 hour(s))  Wet prep, genital     Status: Abnormal   Collection Time: 04/16/16 12:25 AM  Result  Value Ref Range   Yeast Wet Prep HPF POC PRESENT (A) NONE SEEN   Trich, Wet Prep PRESENT (A) NONE SEEN   Clue Cells Wet Prep HPF POC NONE SEEN NONE SEEN   WBC, Wet Prep HPF POC MANY (A) NONE SEEN   Sperm NONE SEEN   CBC     Status: Abnormal   Collection Time: 04/16/16 12:31 AM  Result Value Ref Range   WBC 6.7 4.0 - 10.5 K/uL   RBC 4.08 3.87 - 5.11 MIL/uL   Hemoglobin 12.3  12.0 - 15.0 g/dL   HCT 16.1 (L) 09.6 - 04.5 %   MCV 85.3 78.0 - 100.0 fL   MCH 30.1 26.0 - 34.0 pg   MCHC 35.3 30.0 - 36.0 g/dL   RDW 40.9 81.1 - 91.4 %   Platelets 265 150 - 400 K/uL  US Ob Comp Less 14 Wks  Result Date: 04/16/2016 CLINICAL DATA:  Spotting and cramping since 1700 hours. Estimated gestational age by LMP is 11 weeks 6 days. Quantitative beta HCC is not obtained. EXAM: OBSTETRIC <14 WK ULTRASOUND TECHNIQUE: Transabdominal ultrasound was performed for evaluation of the gestation as well as the maternal uterus and adnexal regions. COMPARISON:  None. FINDINGS: Intrauterine gestational sac: A single intrauterine pregnancy is identified. Yolk sac:  Visualized. Embryo:  Visualized. Cardiac Activity: Visualized. Heart Rate: 170 bpm CRL:   28.4  mm   9 w 4 d                  [place] EDC: 11/15/2016 Subchorionic hemorrhage:  None visualized. Maternal uterus/adnexae: Uterus is anteverted. No myometrial mass lesions identified. Both ovaries are visualized and appear normal in size. No abnormal adnexal masses. No free fluid in the pelvis. IMPRESSION: Single intrauterine pregnancy. Estimated gestational age by crown-rump length is 9 weeks 4 days. No acute complication demonstrated on ultrasound examination. Electronically Signed   By: Burman Nieves M.D.   On: 04/16/2016 01:10     MAU Course  Procedures  MDM Patient treated in MAU with 2g flagyl.   Assessment and Plan   1. Trichomoniasis   2. Vaginal bleeding in pregnancy, first trimester   3. Yeast infection involving the vagina and surrounding area   4. [redacted] weeks gestation of pregnancy   5. Intrauterine pregnancy    DC home Comfort measures reviewed  1st trimester precautions bleeding precautions RX: terazol 7 as directed Return to MAU as needed FU with OB as planned  Partner RX provided for Marilynn Latino 2g flagyl x1  Follow-up Information    [facility] Follow up.   Specialty:  Obstetrics and  Gynecology Contact information: [address], [address] [place] [address] [phone]           Tawnya Crook 04/16/2016, 12:15 AM

## 2016-04-16 NOTE — MAU Note (Signed)
Pt presents complaining of abdominal pain at 2200 and spotting then but is not bleeding anymore. States she is still cramping. Was seen at [facility] tonight but left after triage.

## 2016-04-17 LAB — URINE CULTURE, OB REFLEX

## 2016-04-17 LAB — CULTURE, OB URINE

## 2016-04-20 LAB — TOXASSURE SELECT 13 (MW), URINE

## 2016-04-29 ENCOUNTER — Encounter (HOSPITAL_COMMUNITY): Payer: Self-pay | Admitting: Obstetrics and Gynecology

## 2016-05-10 ENCOUNTER — Encounter (HOSPITAL_COMMUNITY): Payer: Self-pay

## 2016-05-10 ENCOUNTER — Ambulatory Visit (HOSPITAL_COMMUNITY)
Admission: RE | Admit: 2016-05-10 | Discharge: 2016-05-10 | Disposition: A | Discharge status: Home / Self Care | Payer: BLUE CROSS/BLUE SHIELD | Source: Ambulatory Visit | Attending: Obstetrics and Gynecology | Admitting: Obstetrics and Gynecology

## 2016-05-10 ENCOUNTER — Other Ambulatory Visit: Payer: Self-pay | Admitting: Obstetrics and Gynecology

## 2016-05-10 ENCOUNTER — Other Ambulatory Visit: Payer: Self-pay

## 2016-05-10 DIAGNOSIS — O09891 Supervision of other high risk pregnancies, first trimester: Secondary | ICD-10-CM | POA: Insufficient documentation

## 2016-05-10 DIAGNOSIS — Z3490 Encounter for supervision of normal pregnancy, unspecified, unspecified trimester: Secondary | ICD-10-CM

## 2016-05-10 DIAGNOSIS — Z3682 Encounter for antenatal screening for nuchal translucency: Secondary | ICD-10-CM

## 2016-05-10 DIAGNOSIS — Z3A13 13 weeks gestation of pregnancy: Secondary | ICD-10-CM | POA: Insufficient documentation

## 2016-05-10 DIAGNOSIS — Z36 Encounter for antenatal screening for chromosomal anomalies: Secondary | ICD-10-CM | POA: Diagnosis not present

## 2016-05-12 ENCOUNTER — Ambulatory Visit (INDEPENDENT_AMBULATORY_CARE_PROVIDER_SITE_OTHER): Payer: BLUE CROSS/BLUE SHIELD | Admitting: Obstetrics & Gynecology

## 2016-05-12 DIAGNOSIS — Z3480 Encounter for supervision of other normal pregnancy, unspecified trimester: Secondary | ICD-10-CM

## 2016-05-12 DIAGNOSIS — Z3481 Encounter for supervision of other normal pregnancy, first trimester: Secondary | ICD-10-CM

## 2016-05-12 NOTE — Progress Notes (Signed)
   PRENATAL VISIT NOTE  Subjective:  Diane [name] is a 21 y.o. G2P1001 at 4265w2d being seen today for ongoing prenatal care.  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  Patient reports no complaints.  Contractions: Not present. Vag. Bleeding: None.   . Denies leaking of fluid.   The following portions of the patient's history were reviewed and updated as appropriate: allergies, current medications, past family history, past medical history, past social history, past surgical history and problem list. Problem list updated.  Objective:   Vitals:   05/12/16 0904  BP: 124/77  Pulse: (!) 103  Weight: 80.8 kg (178 lb 3.2 oz)    Fetal Status:           General:  Alert, oriented and cooperative. Patient is in no acute distress.  Skin: Skin is warm and dry. No rash noted.   Cardiovascular: Normal heart rate noted  Respiratory: Normal respiratory effort, no problems with respiration noted  Abdomen: Soft, gravid, appropriate for gestational age. Pain/Pressure: Absent     Pelvic:  Cervical exam deferred        Extremities: Normal range of motion.  Edema: None  Mental Status: Normal mood and affect. Normal behavior. Normal judgment and thought content.   Assessment and Plan:  Pregnancy: G2P1001 at 6765w2d  1. Supervision of other normal pregnancy, antepartum 18 week screening - US MFM OB COMP + 14 WK; Future  Preterm labor symptoms and general obstetric precautions including but not limited to vaginal bleeding, contractions, leaking of fluid and fetal movement were reviewed in detail with the patient. Please refer to After Visit Summary  for other counseling recommendations.  Return in about 4 weeks (around 06/09/2016).   Adam PhenixJames G Arnold, MD

## 2016-05-18 ENCOUNTER — Encounter: Payer: Self-pay | Admitting: *Deleted

## 2016-06-09 ENCOUNTER — Encounter: Payer: BLUE CROSS/BLUE SHIELD | Admitting: Obstetrics and Gynecology

## 2016-06-21 ENCOUNTER — Other Ambulatory Visit: Payer: Self-pay | Admitting: Obstetrics & Gynecology

## 2016-06-21 ENCOUNTER — Ambulatory Visit (HOSPITAL_COMMUNITY)
Admission: RE | Admit: 2016-06-21 | Discharge: 2016-06-21 | Disposition: A | Discharge status: Home / Self Care | Payer: BLUE CROSS/BLUE SHIELD | Source: Ambulatory Visit | Attending: Obstetrics & Gynecology | Admitting: Obstetrics & Gynecology

## 2016-06-21 ENCOUNTER — Ambulatory Visit (INDEPENDENT_AMBULATORY_CARE_PROVIDER_SITE_OTHER): Payer: BLUE CROSS/BLUE SHIELD | Admitting: Obstetrics and Gynecology

## 2016-06-21 VITALS — BP 101/69 | HR 111 | Wt 177.4 lb

## 2016-06-21 DIAGNOSIS — Z3A19 19 weeks gestation of pregnancy: Secondary | ICD-10-CM

## 2016-06-21 DIAGNOSIS — Z3480 Encounter for supervision of other normal pregnancy, unspecified trimester: Secondary | ICD-10-CM | POA: Diagnosis not present

## 2016-06-21 DIAGNOSIS — O09892 Supervision of other high risk pregnancies, second trimester: Secondary | ICD-10-CM | POA: Diagnosis not present

## 2016-06-21 DIAGNOSIS — Z3689 Encounter for other specified antenatal screening: Secondary | ICD-10-CM

## 2016-06-21 DIAGNOSIS — O26899 Other specified pregnancy related conditions, unspecified trimester: Secondary | ICD-10-CM

## 2016-06-21 DIAGNOSIS — Z3482 Encounter for supervision of other normal pregnancy, second trimester: Secondary | ICD-10-CM

## 2016-06-21 DIAGNOSIS — Z6791 Unspecified blood type, Rh negative: Secondary | ICD-10-CM

## 2016-06-21 DIAGNOSIS — O09899 Supervision of other high risk pregnancies, unspecified trimester: Secondary | ICD-10-CM

## 2016-06-21 MED ORDER — PRENATAL MULTIVITAMIN CH: 1.0000 | ORAL_TABLET | Freq: Every day | ORAL | 10 refills | Status: DC

## 2016-06-21 MED ORDER — SERTRALINE HCL 50 MG PO TABS: 50.0000 mg | ORAL_TABLET | Freq: Every day | ORAL | 3 refills | Status: DC

## 2016-06-21 NOTE — Progress Notes (Signed)
Patient reports that she started to feel fetal movement a fe days ago, denies pain.

## 2016-06-21 NOTE — Patient Instructions (Signed)

## 2016-06-21 NOTE — Progress Notes (Signed)
   PRENATAL VISIT NOTE  Subjective:  Diane [name] is a 21 y.o. G2P1001 at [redacted]w[redacted]d being seen today for ongoing prenatal care.  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  Patient reports no complaints.  Contractions: Not present. Vag. Bleeding: None.  Movement: Present. Denies leaking of fluid.   The following portions of the patient's history were reviewed and updated as appropriate: allergies, current medications, past family history, past medical history, past social history, past surgical history and problem list. Problem list updated.  Objective:   Vitals:   06/21/16 1532  BP: 101/69  Pulse: (!) 111  Weight: 177 lb 6.4 oz (80.5 kg)    Fetal Status: Fetal Heart Rate (bpm): 152   Movement: Present     General:  Alert, oriented and cooperative. Patient is in no acute distress.  Skin: Skin is warm and dry. No rash noted.   Cardiovascular: Normal heart rate noted  Respiratory: Normal respiratory effort, no problems with respiration noted  Abdomen: Soft, gravid, appropriate for gestational age. Pain/Pressure: Absent     Pelvic:  Cervical exam deferred        Extremities: Normal range of motion.  Edema: None  Mental Status: Normal mood and affect. Normal behavior. Normal judgment and thought content.   Assessment and Plan:  Pregnancy: G2P1001 at [redacted]w[redacted]d  1. Supervision of other normal pregnancy, antepartum Patient is doing well Anatomy ultrasound done today-report not yet available for review AFP today Refills on zoloft, PNV provided per patient request Patient is interested in waterbirth. Information provided - AFP, Serum, Open Spina  Bifida  2. Short interval between pregnancies affecting pregnancy, antepartum   3. Rh negative state in antepartum period Will offer rhogam at 28 weeks  General obstetric precautions including but not limited to vaginal bleeding, contractions, leaking of fluid and fetal movement were reviewed in detail with the patient. Please refer to After Visit Summary for other counseling recommendations.  Return in about 4 weeks (around 07/19/2016) for ROB.   Catalina Antigua, MD

## 2016-06-24 LAB — AFP, SERUM, OPEN SPINA BIFIDA
AFP MOM: 0.98
AFP Value: 47.4 ng/mL
Gest. Age on Collection Date: 19 weeks
Maternal Age At EDD: 23.4 yr
OSBR RISK 1 IN: 10000
TEST RESULTS AFP: NEGATIVE
Weight: 177 [lb_av]

## 2016-07-05 ENCOUNTER — Encounter (HOSPITAL_COMMUNITY): Payer: Self-pay | Admitting: Vascular Surgery

## 2016-07-05 ENCOUNTER — Inpatient Hospital Stay (EMERGENCY_DEPARTMENT_HOSPITAL)
Admission: AD | Admit: 2016-07-05 | Discharge: 2016-07-05 | Disposition: A | Discharge status: Home / Self Care | Payer: BLUE CROSS/BLUE SHIELD | Source: Ambulatory Visit | Attending: Obstetrics & Gynecology | Admitting: Obstetrics & Gynecology

## 2016-07-05 DIAGNOSIS — M542 Cervicalgia: Secondary | ICD-10-CM | POA: Insufficient documentation

## 2016-07-05 DIAGNOSIS — Z881 Allergy status to other antibiotic agents status: Secondary | ICD-10-CM

## 2016-07-05 DIAGNOSIS — Z3A21 21 weeks gestation of pregnancy: Secondary | ICD-10-CM | POA: Insufficient documentation

## 2016-07-05 DIAGNOSIS — Y92414 Local residential or business street as the place of occurrence of the external cause: Secondary | ICD-10-CM | POA: Diagnosis not present

## 2016-07-05 DIAGNOSIS — O26892 Other specified pregnancy related conditions, second trimester: Secondary | ICD-10-CM

## 2016-07-05 DIAGNOSIS — R51 Headache: Secondary | ICD-10-CM | POA: Insufficient documentation

## 2016-07-05 DIAGNOSIS — Z8249 Family history of ischemic heart disease and other diseases of the circulatory system: Secondary | ICD-10-CM

## 2016-07-05 DIAGNOSIS — O9989 Other specified diseases and conditions complicating pregnancy, childbirth and the puerperium: Secondary | ICD-10-CM
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
## 2016-07-05 NOTE — Discharge Instructions (Signed)
GO TO [facility] EMERGENCY ROOM FOR EVALUATION

## 2016-07-05 NOTE — MAU Note (Signed)
Urine sent to lab 
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
## 2016-07-06 ENCOUNTER — Emergency Department (HOSPITAL_COMMUNITY)
Admission: EM | Admit: 2016-07-06 | Discharge: 2016-07-06 | Disposition: A | Discharge status: Home / Self Care | Payer: BLUE CROSS/BLUE SHIELD | Attending: Dermatology | Admitting: Dermatology

## 2016-07-06 ENCOUNTER — Encounter (HOSPITAL_COMMUNITY): Payer: Self-pay | Admitting: Emergency Medicine

## 2016-07-06 ENCOUNTER — Ambulatory Visit (HOSPITAL_COMMUNITY)
Admission: EM | Admit: 2016-07-06 | Discharge: 2016-07-06 | Disposition: A | Discharge status: Home / Self Care | Payer: BLUE CROSS/BLUE SHIELD | Attending: Internal Medicine | Admitting: Internal Medicine

## 2016-07-06 DIAGNOSIS — M5489 Other dorsalgia: Secondary | ICD-10-CM | POA: Diagnosis not present

## 2016-07-06 DIAGNOSIS — M542 Cervicalgia: Secondary | ICD-10-CM | POA: Diagnosis not present

## 2016-07-06 DIAGNOSIS — Z331 Pregnant state, incidental: Secondary | ICD-10-CM | POA: Diagnosis not present

## 2016-07-06 NOTE — Discharge Instructions (Signed)
Tylenol for pain.  Return if any problems.  
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
## 2016-07-18 ENCOUNTER — Inpatient Hospital Stay (HOSPITAL_COMMUNITY)
Admission: AD | Admit: 2016-07-18 | Discharge: 2016-07-18 | Disposition: A | Discharge status: Home / Self Care | Payer: BLUE CROSS/BLUE SHIELD | Source: Ambulatory Visit | Attending: Obstetrics & Gynecology | Admitting: Obstetrics & Gynecology

## 2016-07-18 ENCOUNTER — Encounter (HOSPITAL_COMMUNITY): Payer: Self-pay | Admitting: *Deleted

## 2016-07-18 DIAGNOSIS — R197 Diarrhea, unspecified: Secondary | ICD-10-CM | POA: Insufficient documentation

## 2016-07-18 DIAGNOSIS — Z79899 Other long term (current) drug therapy: Secondary | ICD-10-CM | POA: Diagnosis not present

## 2016-07-18 DIAGNOSIS — R112 Nausea with vomiting, unspecified: Secondary | ICD-10-CM | POA: Diagnosis not present

## 2016-07-18 DIAGNOSIS — Z3A22 22 weeks gestation of pregnancy: Secondary | ICD-10-CM | POA: Insufficient documentation

## 2016-07-18 DIAGNOSIS — F419 Anxiety disorder, unspecified: Secondary | ICD-10-CM | POA: Diagnosis not present

## 2016-07-18 DIAGNOSIS — O9989 Other specified diseases and conditions complicating pregnancy, childbirth and the puerperium: Secondary | ICD-10-CM

## 2016-07-18 DIAGNOSIS — O99342 Other mental disorders complicating pregnancy, second trimester: Secondary | ICD-10-CM | POA: Insufficient documentation

## 2016-07-18 DIAGNOSIS — R1110 Vomiting, unspecified: Secondary | ICD-10-CM | POA: Diagnosis not present

## 2016-07-18 DIAGNOSIS — K2970 Gastritis, unspecified, without bleeding: Secondary | ICD-10-CM | POA: Diagnosis not present

## 2016-07-18 LAB — URINALYSIS, DIPSTICK ONLY
Bilirubin Urine: NEGATIVE
Glucose, UA: NEGATIVE mg/dL
Hgb urine dipstick: NEGATIVE
Ketones, ur: 5 mg/dL — AB
Leukocytes, UA: NEGATIVE
Nitrite: NEGATIVE
PH: 6 (ref 5.0–8.0)
Protein, ur: NEGATIVE mg/dL
SPECIFIC GRAVITY, URINE: 1.019 (ref 1.005–1.030)

## 2016-07-18 MED ORDER — PROMETHAZINE HCL 25 MG/ML IJ SOLN
25.0000 mg | Freq: Once | INTRAVENOUS | Status: AC
  Administered 2016-07-18: 25 mg via INTRAVENOUS
  Filled 2016-07-18: qty 1

## 2016-07-18 MED ORDER — ONDANSETRON 8 MG PO TBDP: 8.0000 mg | ORAL_TABLET | Freq: Three times a day (TID) | ORAL | 0 refills | Status: DC | PRN

## 2016-07-18 NOTE — Discharge Instructions (Signed)
Drink at least 8 8-oz glasses of water every day. Take Tylenol 325 mg 2 tablets by mouth every 4 hours if needed for pain. You have a prescription at your pharmacy if you continue to have nausea. Tonight have liquids and crackers.  Tomorrow advance slowly to other foods. Bananas, Rice, toast and applesauce are good food choices. Do not eat any fried foods or high fat foods like salad dressing. Keep your appointments in the office. Return if you continue to vomit and cannot keep down fluids or if you develop worsening abdominal pain.
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
## 2016-07-20 ENCOUNTER — Encounter: Payer: BLUE CROSS/BLUE SHIELD | Admitting: Obstetrics & Gynecology

## 2016-07-21 ENCOUNTER — Ambulatory Visit (INDEPENDENT_AMBULATORY_CARE_PROVIDER_SITE_OTHER): Payer: BLUE CROSS/BLUE SHIELD | Admitting: Obstetrics and Gynecology

## 2016-07-21 VITALS — BP 115/76 | HR 98 | Wt 179.0 lb

## 2016-07-21 DIAGNOSIS — O99891 Other specified diseases and conditions complicating pregnancy: Secondary | ICD-10-CM

## 2016-07-21 DIAGNOSIS — O99342 Other mental disorders complicating pregnancy, second trimester: Secondary | ICD-10-CM

## 2016-07-21 DIAGNOSIS — O99340 Other mental disorders complicating pregnancy, unspecified trimester: Secondary | ICD-10-CM

## 2016-07-21 DIAGNOSIS — O9989 Other specified diseases and conditions complicating pregnancy, childbirth and the puerperium: Secondary | ICD-10-CM

## 2016-07-21 DIAGNOSIS — Z3480 Encounter for supervision of other normal pregnancy, unspecified trimester: Secondary | ICD-10-CM

## 2016-07-21 DIAGNOSIS — Z3482 Encounter for supervision of other normal pregnancy, second trimester: Secondary | ICD-10-CM

## 2016-07-21 DIAGNOSIS — F329 Major depressive disorder, single episode, unspecified: Secondary | ICD-10-CM

## 2016-07-21 DIAGNOSIS — M549 Dorsalgia, unspecified: Secondary | ICD-10-CM

## 2016-07-21 DIAGNOSIS — O09892 Supervision of other high risk pregnancies, second trimester: Secondary | ICD-10-CM

## 2016-07-21 DIAGNOSIS — Z6791 Unspecified blood type, Rh negative: Secondary | ICD-10-CM

## 2016-07-21 DIAGNOSIS — O26899 Other specified pregnancy related conditions, unspecified trimester: Secondary | ICD-10-CM
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
## 2016-07-21 NOTE — Patient Instructions (Signed)

## 2016-08-07 ENCOUNTER — Inpatient Hospital Stay (HOSPITAL_COMMUNITY)
Admission: AD | Admit: 2016-08-07 | Discharge: 2016-08-07 | Disposition: A | Discharge status: Home / Self Care | Payer: BLUE CROSS/BLUE SHIELD | Source: Ambulatory Visit | Attending: Obstetrics & Gynecology | Admitting: Obstetrics & Gynecology

## 2016-08-07 ENCOUNTER — Encounter (HOSPITAL_COMMUNITY): Payer: Self-pay

## 2016-08-07 DIAGNOSIS — Z79899 Other long term (current) drug therapy: Secondary | ICD-10-CM | POA: Diagnosis not present

## 2016-08-07 DIAGNOSIS — M549 Dorsalgia, unspecified: Secondary | ICD-10-CM

## 2016-08-07 DIAGNOSIS — O9989 Other specified diseases and conditions complicating pregnancy, childbirth and the puerperium: Secondary | ICD-10-CM

## 2016-08-07 DIAGNOSIS — O4702 False labor before 37 completed weeks of gestation, second trimester: Secondary | ICD-10-CM

## 2016-08-07 DIAGNOSIS — F419 Anxiety disorder, unspecified: Secondary | ICD-10-CM | POA: Diagnosis not present

## 2016-08-07 DIAGNOSIS — M545 Low back pain: Secondary | ICD-10-CM | POA: Diagnosis not present

## 2016-08-07 DIAGNOSIS — O26892 Other specified pregnancy related conditions, second trimester: Secondary | ICD-10-CM | POA: Insufficient documentation

## 2016-08-07 DIAGNOSIS — O99342 Other mental disorders complicating pregnancy, second trimester: Secondary | ICD-10-CM | POA: Diagnosis not present

## 2016-08-07 DIAGNOSIS — O99891 Other specified diseases and conditions complicating pregnancy: Secondary | ICD-10-CM

## 2016-08-07 DIAGNOSIS — Z3A25 25 weeks gestation of pregnancy: Secondary | ICD-10-CM | POA: Insufficient documentation

## 2016-08-07 LAB — WET PREP, GENITAL
Clue Cells Wet Prep HPF POC: NONE SEEN
SPERM: NONE SEEN
TRICH WET PREP: NONE SEEN
YEAST WET PREP: NONE SEEN

## 2016-08-07 LAB — URINALYSIS, ROUTINE W REFLEX MICROSCOPIC
BILIRUBIN URINE: NEGATIVE
Glucose, UA: NEGATIVE mg/dL
HGB URINE DIPSTICK: NEGATIVE
KETONES UR: 20 mg/dL — AB
Leukocytes, UA: NEGATIVE
NITRITE: NEGATIVE
PROTEIN: NEGATIVE mg/dL
SPECIFIC GRAVITY, URINE: 1.013 (ref 1.005–1.030)
pH: 8 (ref 5.0–8.0)

## 2016-08-07 MED ORDER — CYCLOBENZAPRINE HCL 10 MG PO TABS
10.0000 mg | ORAL_TABLET | Freq: Once | ORAL | Status: AC
  Administered 2016-08-07: 10 mg via ORAL
  Filled 2016-08-07: qty 1

## 2016-08-07 MED ORDER — NIFEDIPINE 10 MG PO CAPS
10.0000 mg | ORAL_CAPSULE | ORAL | Status: AC
  Administered 2016-08-07 (×3): 10 mg via ORAL
  Filled 2016-08-07 (×3): qty 1

## 2016-08-07 NOTE — MAU Provider Note (Signed)
History     CSN: [id number]  Arrival date and time: 08/07/16 1358   Chief Complaint  Patient presents with  . Contractions  . Back Pain   G2P1001 @25 .5 here with ctx and decreased FM. Ctx started around 1130 this am. Feeling about q7-8 min. Less frequent since arrival. Felt FM this am but none since ctx started until [facility] placed here. Also reports bright red blood spotting on toilet paper around 1230. No bleeding since. No recent IC. No coughing, straining, or vomiting. No LOF or vaginal discharge.   OB History    Gravida Para Term Preterm AB Living   2 1 1     1    SAB TAB Ectopic Multiple Live Births         0 1      Past Medical History:  Diagnosis Date  . Anemia   . Anxiety   . Headache   . UTI (lower urinary tract infection)     History reviewed. No pertinent surgical history.  Family History  Problem Relation Age of Onset  . Hypertension Mother   . Hypertension Maternal Grandmother   . Diabetes Maternal Grandmother   . Congestive Heart Failure Maternal Grandmother   . Hypertension Maternal Uncle   . Diabetes Maternal Uncle   . Hypertension Maternal Uncle   . Diabetes Maternal Uncle     Social History  Substance Use Topics  . Smoking status: Never Smoker  . Smokeless tobacco: Never Used  . Alcohol use No    Allergies:  Allergies  Allergen Reactions  . Cefzil [Cefprozil] Hives  . Rocephin [Ceftriaxone Sodium In Dextrose] Hives    Prescriptions Prior to Admission  Medication Sig Dispense Refill Last Dose  . acetaminophen (TYLENOL) 500 MG tablet Take 1,000 mg by mouth every 6 (six) hours as needed for moderate pain.   Past Week at Unknown time  . ondansetron (ZOFRAN ODT) 8 MG disintegrating tablet Take 1 tablet (8 mg total) by mouth every 8 (eight) hours as needed for nausea or vomiting. 6 tablet 0 Past Week at Unknown time  . Prenatal Vit-Fe Fumarate-FA (PRENATAL MULTIVITAMIN) TABS tablet Take 1 tablet by mouth daily at 12 noon. 1 tablet 10 08/06/2016 at  Unknown time  . sertraline (ZOLOFT) 50 MG tablet Take 1 tablet (50 mg total) by mouth daily. 30 tablet 3 08/06/2016 at Unknown time    Review of Systems  Gastrointestinal: Positive for abdominal pain (ctx).  Genitourinary: Positive for vaginal bleeding. Negative for vaginal discharge.   Physical Exam   Blood pressure 122/72, pulse (!) 107, temperature 98.1 F (36.7 C), temperature source Oral, resp. rate 17, height 5\' 9"  (1.753 m), weight 78.7 kg (173 lb 8 oz), last menstrual period 01/24/2016, SpO2 97 %, not currently breastfeeding.  Physical Exam  Constitutional: She is oriented to person, place, and time. She appears well-developed and well-nourished. No distress.  HENT:  Head: Normocephalic and atraumatic.  Neck: Normal range of motion.  Respiratory: Effort normal. No respiratory distress.  GI: Soft. She exhibits no distension. There is no tenderness.  gravid  Genitourinary:  Genitourinary Comments: External: no lesions or erythema Vagina: rugated, pink, moist, thick white discharge, no trace of blood  SVE: closed/thick  Musculoskeletal: Normal range of motion.  Neurological: She is alert and oriented to person, place, and time.  Skin: Skin is warm and dry.  Psychiatric: She has a normal mood and affect.   EFM: 145 bpm, mod variability, + accels, no decels Toco: q2, irritability  Results for orders placed or performed during the hospital encounter of 08/07/16 (from the past 24 hour(s))  Urinalysis, Routine w reflex microscopic     Status: Abnormal   Collection Time: 08/07/16  2:05 PM  Result Value Ref Range   Color, Urine YELLOW YELLOW   APPearance CLEAR CLEAR   Specific Gravity, Urine 1.013 1.005 - 1.030   pH 8.0 5.0 - 8.0   Glucose, UA NEGATIVE NEGATIVE mg/dL   Hgb urine dipstick NEGATIVE NEGATIVE   Bilirubin Urine NEGATIVE NEGATIVE   Ketones, ur 20 (A) NEGATIVE mg/dL   Protein, ur NEGATIVE NEGATIVE mg/dL   Nitrite NEGATIVE NEGATIVE   Leukocytes, UA NEGATIVE  NEGATIVE  Wet prep, genital     Status: Abnormal   Collection Time: 08/07/16  2:50 PM  Result Value Ref Range   Yeast Wet Prep HPF POC NONE SEEN NONE SEEN   Trich, Wet Prep NONE SEEN NONE SEEN   Clue Cells Wet Prep HPF POC NONE SEEN NONE SEEN   WBC, Wet Prep HPF POC FEW (A) NONE SEEN   Sperm NONE SEEN    MAU Course  Procedures Procardia 10 mg po Flexeril 10 mg po Po fluids  MDM Labs ordered and reviewed. Ctx improved after Procardia. Pt now reports constant lower back pain. Flexeril ordered. Back pain improved after medication. No evidence of UTI or PTL. Stable for discharge home.   Assessment and Plan   1. [redacted] weeks gestation of pregnancy   2. Preterm uterine contractions in second trimester, antepartum   3. Back pain affecting pregnancy in second trimester    Discharge home Follow up in OB office as scheduled PTL precautions  Allergies as of 08/07/2016      Reactions   Cefzil [cefprozil] Hives   Rocephin [ceftriaxone Sodium In Dextrose] Hives      Medication List    TAKE these medications   acetaminophen 500 MG tablet Commonly known as:  TYLENOL Take 1,000 mg by mouth every 6 (six) hours as needed for moderate pain.   ondansetron 8 MG disintegrating tablet Commonly known as:  ZOFRAN ODT Take 1 tablet (8 mg total) by mouth every 8 (eight) hours as needed for nausea or vomiting.   prenatal multivitamin Tabs tablet Take 1 tablet by mouth daily at 12 noon.   sertraline 50 MG tablet Commonly known as:  ZOLOFT Take 1 tablet (50 mg total) by mouth daily.      Donette LarryMelanie Anavi Branscum, CNM 08/07/2016, 2:52 PM

## 2016-08-07 NOTE — Discharge Instructions (Signed)
Back Pain in Pregnancy °Back pain during pregnancy is common. Back pain may be caused by several factors that are related to changes during your pregnancy. °Follow these instructions at home: °Managing pain, stiffness, and swelling °· If directed, apply ice for sudden (acute) back pain. °? Put ice in a plastic bag. °? Place a towel between your skin and the bag. °? Leave the ice on for 20 minutes, 2-3 times per day. °· If directed, apply heat to the affected area before you exercise: °? Place a towel between your skin and the heat pack or heating pad. °? Leave the heat on for 20-30 minutes. °? Remove the heat if your skin turns bright red. This is especially important if you are unable to feel pain, heat, or cold. You may have a greater risk of getting burned. °Activity °· Exercise as told by your health care provider. Exercising is the best way to prevent or manage back pain. °· Listen to your body when lifting. If lifting hurts, ask for help or bend your knees. This uses your leg muscles instead of your back muscles. °· Squat down when picking up something from the floor. Do not bend over. °· Only use bed rest as told by your health care provider. Bed rest should only be used for the most severe episodes of back pain. °Standing, Sitting, and Lying Down °· Do not stand in one place for long periods of time. °· Use good posture when sitting. Make sure your head rests over your shoulders and is not hanging forward. Use a pillow on your lower back if necessary. °· Try sleeping on your side, preferably the left side, with a pillow or two between your legs. If you are sore after a night's rest, your bed may be too soft. A firm mattress may provide more support for your back during pregnancy. °General instructions °· Do not wear high heels. °· Eat a healthy diet. Try to gain weight within your health care provider's recommendations. °· Use a maternity girdle, elastic sling, or back brace as told by your health care  provider. °· Take over-the-counter and prescription medicines only as told by your health care provider. °· Keep all follow-up visits as told by your health care provider. This is important. This includes any visits with any specialists, such as a physical therapist. °Contact a health care provider if: °· Your back pain interferes with your daily activities. °· You have increasing pain in other parts of your body. °Get help right away if: °· You develop numbness, tingling, weakness, or problems with the use of your arms or legs. °· You develop severe back pain that is not controlled with medicine. °· You have a sudden change in bowel or bladder control. °· You develop shortness of breath, dizziness, or you faint. °· You develop nausea, vomiting, or sweating. °· You have back pain that is a rhythmic, cramping pain similar to labor pains. Labor pain is usually 1-2 minutes apart, lasts for about 1 minute, and involves a bearing down feeling or pressure in your pelvis. °· You have back pain and your water breaks or you have vaginal bleeding. °· You have back pain or numbness that travels down your leg. °· Your back pain developed after you fell. °· You develop pain on one side of your back. °· You see blood in your urine. °· You develop skin blisters in the area of your back pain. °This information is not intended to replace advice given to you   by your health care provider. Make sure you discuss any questions you have with your health care provider. °Document Released: 05/26/2005 Document Revised: 07/24/2015 Document Reviewed: 10/30/2014 °Elsevier Interactive Patient Education © 2018 [employer] ° °Braxton Hicks Contractions °Contractions of the uterus can occur throughout pregnancy, but they are not always a sign that you are in labor. You may have practice contractions called Braxton Hicks contractions. These false labor contractions are sometimes confused with true labor. °What are Braxton Hicks  contractions? °Braxton Hicks contractions are tightening movements that occur in the muscles of the uterus before labor. Unlike true labor contractions, these contractions do not result in opening (dilation) and thinning of the cervix. Toward the end of pregnancy (32-34 weeks), Braxton Hicks contractions can happen more often and may become stronger. These contractions are sometimes difficult to tell apart from true labor because they can be very uncomfortable. You should not feel embarrassed if you go to the hospital with false labor. °Sometimes, the only way to tell if you are in true labor is for your health care provider to look for changes in the cervix. The health care provider will do a physical exam and may monitor your contractions. If you are not in true labor, the exam should show that your cervix is not dilating and your water has not broken. °If there are no prenatal problems or other health problems associated with your pregnancy, it is completely safe for you to be sent home with false labor. You may continue to have Braxton Hicks contractions until you go into true labor. °How can I tell the difference between true labor and false labor? °· Differences °? False labor °? Contractions last 30-70 seconds.: Contractions are usually shorter and not as strong as true labor contractions. °? Contractions become very regular.: Contractions are usually irregular. °? Discomfort is usually felt in the top of the uterus, and it spreads to the lower abdomen and low back.: Contractions are often felt in the front of the lower abdomen and in the groin. °? Contractions do not go away with walking.: Contractions may go away when you walk around or change positions while lying down. °? Contractions usually become more intense and increase in frequency.: Contractions get weaker and are shorter-lasting as time goes on. °? The cervix dilates and gets thinner.: The cervix usually does not dilate or become thin. °Follow  these instructions at home: °· Take over-the-counter and prescription medicines only as told by your health care provider. °· Keep up with your usual exercises and follow other instructions from your health care provider. °· Eat and drink lightly if you think you are going into labor. °· If Braxton Hicks contractions are making you uncomfortable: °? Change your position from lying down or resting to walking, or change from walking to resting. °? Sit and rest in a tub of warm water. °? Drink enough fluid to keep your urine clear or pale yellow. Dehydration may cause these contractions. °? Do slow and deep breathing several times an hour. °· Keep all follow-up prenatal visits as told by your health care provider. This is important. °Contact a health care provider if: °· You have a fever. °· You have continuous pain in your abdomen. °Get help right away if: °· Your contractions become stronger, more regular, and closer together. °· You have fluid leaking or gushing from your vagina. °· You pass blood-tinged mucus (bloody show). °· You have bleeding from your vagina. °· You have low back pain that you   never had before. °· You feel your baby’s head pushing down and causing pelvic pressure. °· Your baby is not moving inside you as much as it used to. °Summary °· Contractions that occur before labor are called Braxton Hicks contractions, false labor, or practice contractions. °· Braxton Hicks contractions are usually shorter, weaker, farther apart, and less regular than true labor contractions. True labor contractions usually become progressively stronger and regular and they become more frequent. °· Manage discomfort from Braxton Hicks contractions by changing position, resting in a warm bath, drinking plenty of water, or practicing deep breathing. °This information is not intended to replace advice given to you by your health care provider. Make sure you discuss any questions you have with your health care  provider. °Document Released: 02/15/2005 Document Revised: 01/05/2016 Document Reviewed: 01/05/2016 °Elsevier Interactive Patient Education © 2017 [employer] ° °

## 2016-08-07 NOTE — Progress Notes (Signed)
Patient is feeling the baby move now

## 2016-08-07 NOTE — MAU Note (Signed)
Pt states around noon today she started feeling some lower back pain and cramping. Pt states she started timing the cramping and it is 7-8 minutes apart. Pt states when she went to the bathroom around 1230 she saw some bright red blood when she wiped. Pt states the baby isn't moving as much as normal today. Pt denies leaking.

## 2016-08-08 ENCOUNTER — Inpatient Hospital Stay (HOSPITAL_COMMUNITY)
Admission: AD | Admit: 2016-08-08 | Discharge: 2016-08-08 | Disposition: A | Discharge status: Home / Self Care | Payer: BLUE CROSS/BLUE SHIELD | Source: Ambulatory Visit | Attending: Obstetrics and Gynecology | Admitting: Obstetrics and Gynecology

## 2016-08-08 ENCOUNTER — Encounter (HOSPITAL_COMMUNITY): Payer: Self-pay

## 2016-08-08 ENCOUNTER — Inpatient Hospital Stay (HOSPITAL_COMMUNITY): Payer: BLUE CROSS/BLUE SHIELD

## 2016-08-08 DIAGNOSIS — B349 Viral infection, unspecified: Secondary | ICD-10-CM | POA: Insufficient documentation

## 2016-08-08 DIAGNOSIS — R110 Nausea: Secondary | ICD-10-CM | POA: Insufficient documentation

## 2016-08-08 DIAGNOSIS — M549 Dorsalgia, unspecified: Secondary | ICD-10-CM | POA: Diagnosis not present

## 2016-08-08 DIAGNOSIS — O98512 Other viral diseases complicating pregnancy, second trimester: Secondary | ICD-10-CM | POA: Insufficient documentation

## 2016-08-08 DIAGNOSIS — Z833 Family history of diabetes mellitus: Secondary | ICD-10-CM | POA: Diagnosis not present

## 2016-08-08 DIAGNOSIS — Z888 Allergy status to other drugs, medicaments and biological substances status: Secondary | ICD-10-CM | POA: Diagnosis not present

## 2016-08-08 DIAGNOSIS — O99342 Other mental disorders complicating pregnancy, second trimester: Secondary | ICD-10-CM | POA: Diagnosis not present

## 2016-08-08 DIAGNOSIS — Z79899 Other long term (current) drug therapy: Secondary | ICD-10-CM | POA: Insufficient documentation

## 2016-08-08 DIAGNOSIS — Z8249 Family history of ischemic heart disease and other diseases of the circulatory system: Secondary | ICD-10-CM | POA: Insufficient documentation

## 2016-08-08 DIAGNOSIS — Z3A25 25 weeks gestation of pregnancy: Secondary | ICD-10-CM | POA: Diagnosis not present

## 2016-08-08 DIAGNOSIS — R1030 Lower abdominal pain, unspecified: Secondary | ICD-10-CM | POA: Diagnosis not present

## 2016-08-08 DIAGNOSIS — R109 Unspecified abdominal pain: Secondary | ICD-10-CM | POA: Diagnosis not present

## 2016-08-08 DIAGNOSIS — O26892 Other specified pregnancy related conditions, second trimester: Secondary | ICD-10-CM | POA: Diagnosis not present

## 2016-08-08 DIAGNOSIS — O4702 False labor before 37 completed weeks of gestation, second trimester: Secondary | ICD-10-CM | POA: Diagnosis not present

## 2016-08-08 DIAGNOSIS — O9989 Other specified diseases and conditions complicating pregnancy, childbirth and the puerperium: Secondary | ICD-10-CM

## 2016-08-08 DIAGNOSIS — R42 Dizziness and giddiness: Secondary | ICD-10-CM | POA: Insufficient documentation

## 2016-08-08 LAB — COMPREHENSIVE METABOLIC PANEL
ALK PHOS: 53 U/L (ref 38–126)
ALT: 8 U/L — AB (ref 14–54)
AST: 15 U/L (ref 15–41)
Albumin: 3.2 g/dL — ABNORMAL LOW (ref 3.5–5.0)
Anion gap: 7 (ref 5–15)
BUN: 6 mg/dL (ref 6–20)
CALCIUM: 8.6 mg/dL — AB (ref 8.9–10.3)
CHLORIDE: 105 mmol/L (ref 101–111)
CO2: 25 mmol/L (ref 22–32)
CREATININE: 0.6 mg/dL (ref 0.44–1.00)
GFR calc non Af Amer: >60 mL/min (ref >60)
Glucose, Bld: 92 mg/dL (ref 65–99)
Potassium: 3.9 mmol/L (ref 3.5–5.1)
SODIUM: 137 mmol/L (ref 135–145)
Total Bilirubin: 1.2 mg/dL (ref 0.3–1.2)
Total Protein: 7.2 g/dL (ref 6.5–8.1)

## 2016-08-08 LAB — CBC WITH DIFFERENTIAL/PLATELET
BASOS ABS: 0 10*3/uL (ref 0.0–0.1)
BASOS PCT: 0 %
EOS ABS: 0 10*3/uL (ref 0.0–0.7)
EOS PCT: 0 %
HCT: 32.9 % — ABNORMAL LOW (ref 36.0–46.0)
HEMOGLOBIN: 11.3 g/dL — AB (ref 12.0–15.0)
LYMPHS ABS: 1.2 10*3/uL (ref 0.7–4.0)
Lymphocytes Relative: 26 %
MCH: 32 pg (ref 26.0–34.0)
MCHC: 34.3 g/dL (ref 30.0–36.0)
MCV: 93.2 fL (ref 78.0–100.0)
Monocytes Absolute: 0.2 10*3/uL (ref 0.1–1.0)
Monocytes Relative: 4 %
NEUTROS PCT: 70 %
Neutro Abs: 3.3 10*3/uL (ref 1.7–7.7)
PLATELETS: 218 10*3/uL (ref 150–400)
RBC: 3.53 MIL/uL — AB (ref 3.87–5.11)
RDW: 12.5 % (ref 11.5–15.5)
WBC: 4.7 10*3/uL (ref 4.0–10.5)

## 2016-08-08 LAB — URINALYSIS, ROUTINE W REFLEX MICROSCOPIC
BILIRUBIN URINE: NEGATIVE
Glucose, UA: NEGATIVE mg/dL
HGB URINE DIPSTICK: NEGATIVE
KETONES UR: NEGATIVE mg/dL
Leukocytes, UA: NEGATIVE
Nitrite: NEGATIVE
PROTEIN: NEGATIVE mg/dL
SPECIFIC GRAVITY, URINE: 1.023 (ref 1.005–1.030)
pH: 7 (ref 5.0–8.0)

## 2016-08-08 MED ORDER — NIFEDIPINE ER OSMOTIC RELEASE 30 MG PO TB24
30.0000 mg | ORAL_TABLET | Freq: Every day | ORAL | Status: DC
  Administered 2016-08-08: 30 mg via ORAL
  Filled 2016-08-08: qty 1

## 2016-08-08 NOTE — MAU Note (Signed)
Pt states she was seen here yesterday for bleeding and cramping. Pt states when she got home yesterday she felt very lightheaded and almost passed out. Pt states the cramping has continued and woke her up this morning. Pt states the cramping is worse than yesterday. Pt c/o feeling dizzy and naseous today. Pt states baby is moving normally. Pt denies bleeding and leaking of fluid today.

## 2016-08-08 NOTE — MAU Provider Note (Signed)
History     CSN: [id number]  Arrival date and time: 08/08/16 1142   None     Chief Complaint  Patient presents with  . Dizziness  . Abdominal Cramping   Patient is a 21 year old G2 P1 at 25 weeks and 6 days who presents today with recurrent lower abdominal pain as well as some lightheadedness and nausea. She does report 1 episode of diarrhea yesterday. She reports her abdominal pain is lower in the abdomen and cramping in nature. She was seen here in the MA U yesterday and received several doses of Procardia with improvement of her symptoms. Additionally she had a wet prep and urinalysis performed which were unremarkable. She reports after returning home her symptoms recurred and she was informed if she had lower cramping again she should return to the MA U as soon as she could. She does report some mild nausea that has been persistent. She has no vomiting.    OB History    Gravida Para Term Preterm AB Living   2 1 1     1    SAB TAB Ectopic Multiple Live Births         0 1      Past Medical History:  Diagnosis Date  . Anemia   . Anxiety   . Headache   . UTI (lower urinary tract infection)     History reviewed. No pertinent surgical history.  Family History  Problem Relation Age of Onset  . Hypertension Mother   . Hypertension Maternal Grandmother   . Diabetes Maternal Grandmother   . Congestive Heart Failure Maternal Grandmother   . Hypertension Maternal Uncle   . Diabetes Maternal Uncle   . Hypertension Maternal Uncle   . Diabetes Maternal Uncle     Social History  Substance Use Topics  . Smoking status: Never Smoker  . Smokeless tobacco: Never Used  . Alcohol use No    Allergies:  Allergies  Allergen Reactions  . Cefzil [Cefprozil] Hives  . Rocephin [Ceftriaxone Sodium In Dextrose] Hives    Prescriptions Prior to Admission  Medication Sig Dispense Refill Last Dose  . acetaminophen (TYLENOL) 500 MG tablet Take 1,000 mg by mouth every 6 (six) hours as  needed for moderate pain.   Past Week at Unknown time  . ondansetron (ZOFRAN ODT) 8 MG disintegrating tablet Take 1 tablet (8 mg total) by mouth every 8 (eight) hours as needed for nausea or vomiting. 6 tablet 0 08/07/2016 at Unknown time  . Prenatal Vit-Fe Fumarate-FA (PRENATAL MULTIVITAMIN) TABS tablet Take 1 tablet by mouth daily at 12 noon. 1 tablet 10 Past Week at Unknown time  . sertraline (ZOLOFT) 50 MG tablet Take 1 tablet (50 mg total) by mouth daily. 30 tablet 3 Past Week at Unknown time    Review of Systems  Constitutional: Negative for chills and fever.  HENT: Negative for congestion and rhinorrhea.   Respiratory: Negative for cough and shortness of breath.   Cardiovascular: Negative for chest pain and palpitations.  Gastrointestinal: Positive for abdominal pain, diarrhea and nausea. Negative for abdominal distention, constipation and vomiting.  Genitourinary: Positive for frequency. Negative for difficulty urinating, dysuria and hematuria.  Skin: Negative for pallor and rash.  Neurological: Positive for dizziness and light-headedness. Negative for weakness.   Physical Exam   Blood pressure 107/62, pulse 88, temperature 98.1 F (36.7 C), temperature source Oral, resp. rate 17, height 5\' 9"  (1.753 m), weight 174 lb (78.9 kg), last menstrual period 01/24/2016, SpO2 99 %, not  currently breastfeeding.  Physical Exam  Vitals reviewed. Constitutional: She is oriented to person, place, and time. She appears well-developed and well-nourished.  HENT:  Head: Normocephalic and atraumatic.  Eyes: Conjunctivae are normal. Pupils are equal, round, and reactive to light.  Neck: Normal range of motion.  Cardiovascular: Normal rate, regular rhythm and normal heart sounds.   Respiratory: Effort normal. No respiratory distress. She has no wheezes.  GI: Soft. Bowel sounds are normal. She exhibits no distension. There is tenderness. There is no rebound.  Mild tenderness throughout especially in  the lower abdominal area  Genitourinary:  Genitourinary Comments: Cervix appears approximately 50-60% thinned out. An fingertip. No significant vaginal discharge or vaginal irritation. No cervical motion tenderness.  Musculoskeletal: Normal range of motion. She exhibits no edema.  Neurological: She is alert and oriented to person, place, and time. No cranial nerve deficit.  Skin: Skin is warm and dry.  Psychiatric: She has a normal mood and affect. Her behavior is normal.    MAU Course  Procedures  MDM In MA U patient underwent evaluation with vaginal exam which revealed a thin to cervix and minimal dilation. Lab results from yesterday were reviewed and a wet prep was performed but no gonorrhea and chlamydia. That was collected today and is pending.  Transvaginal ultrasound for cervical length performed. Cervical length was found to be 4.09 cm. Did not perform a fetal fibronectin as patient had a vaginal exam yesterday.  Procardia 30 mg XL was given to the patient. She did not tolerate short acting well yesterday. Assessment and Plan  #1: Viral syndrome patient likely with viral syndrome with episodes of diarrhea and nausea and crampy abdominal pain. Rule out preterm labor at this time with reassuring cervical length of greater than 4 cm. Could consider discharge patient home with when necessary Procardia but she already has a low blood pressure so we will defer on this at this time. Okay for discharge home supportive care prescribe Zofran when necessary.  Ernestina Penna 08/08/2016, 1:49 PM

## 2016-08-08 NOTE — Discharge Instructions (Signed)

## 2016-08-09 LAB — GC/CHLAMYDIA PROBE AMP (~~LOC~~) NOT AT ARMC
CHLAMYDIA, DNA PROBE: NEGATIVE
Neisseria Gonorrhea: NEGATIVE

## 2016-08-18 ENCOUNTER — Other Ambulatory Visit: Payer: BLUE CROSS/BLUE SHIELD

## 2016-08-19 ENCOUNTER — Encounter: Payer: BLUE CROSS/BLUE SHIELD | Admitting: Obstetrics and Gynecology

## 2016-08-19 ENCOUNTER — Other Ambulatory Visit: Payer: BLUE CROSS/BLUE SHIELD

## 2016-08-19 DIAGNOSIS — Z3480 Encounter for supervision of other normal pregnancy, unspecified trimester: Secondary | ICD-10-CM

## 2016-08-20 LAB — HIV ANTIBODY (ROUTINE TESTING W REFLEX): HIV Screen 4th Generation wRfx: NONREACTIVE

## 2016-08-20 LAB — GLUCOSE TOLERANCE, 2 HOURS W/ 1HR
GLUCOSE, 2 HOUR: 75 mg/dL (ref 65–152)
Glucose, 1 hour: 104 mg/dL (ref 65–179)
Glucose, Fasting: 75 mg/dL (ref 65–91)

## 2016-08-20 LAB — CBC
HEMATOCRIT: 33.1 % — AB (ref 34.0–46.6)
HEMOGLOBIN: 11.4 g/dL (ref 11.1–15.9)
MCH: 31.7 pg (ref 26.6–33.0)
MCHC: 34.4 g/dL (ref 31.5–35.7)
MCV: 92 fL (ref 79–97)
Platelets: 216 10*3/uL (ref 150–379)
RBC: 3.6 x10E6/uL — AB (ref 3.77–5.28)
RDW: 13.1 % (ref 12.3–15.4)
WBC: 4.9 10*3/uL (ref 3.4–10.8)

## 2016-08-20 LAB — RPR: RPR: NONREACTIVE

## 2016-08-23 ENCOUNTER — Ambulatory Visit (INDEPENDENT_AMBULATORY_CARE_PROVIDER_SITE_OTHER): Payer: BLUE CROSS/BLUE SHIELD | Admitting: Obstetrics and Gynecology

## 2016-08-23 VITALS — BP 113/73 | HR 96 | Wt 175.4 lb

## 2016-08-23 DIAGNOSIS — O9989 Other specified diseases and conditions complicating pregnancy, childbirth and the puerperium: Secondary | ICD-10-CM

## 2016-08-23 DIAGNOSIS — O99891 Other specified diseases and conditions complicating pregnancy: Secondary | ICD-10-CM

## 2016-08-23 DIAGNOSIS — F32A Depression, unspecified: Secondary | ICD-10-CM

## 2016-08-23 DIAGNOSIS — O26893 Other specified pregnancy related conditions, third trimester: Secondary | ICD-10-CM

## 2016-08-23 DIAGNOSIS — O26899 Other specified pregnancy related conditions, unspecified trimester: Secondary | ICD-10-CM

## 2016-08-23 DIAGNOSIS — O360930 Maternal care for other rhesus isoimmunization, third trimester, not applicable or unspecified: Secondary | ICD-10-CM | POA: Diagnosis not present

## 2016-08-23 DIAGNOSIS — F329 Major depressive disorder, single episode, unspecified: Secondary | ICD-10-CM

## 2016-08-23 DIAGNOSIS — Z6791 Unspecified blood type, Rh negative: Secondary | ICD-10-CM

## 2016-08-23 DIAGNOSIS — O99340 Other mental disorders complicating pregnancy, unspecified trimester: Secondary | ICD-10-CM

## 2016-08-23 DIAGNOSIS — O09893 Supervision of other high risk pregnancies, third trimester: Secondary | ICD-10-CM

## 2016-08-23 DIAGNOSIS — M549 Dorsalgia, unspecified: Secondary | ICD-10-CM

## 2016-08-23 DIAGNOSIS — Z3483 Encounter for supervision of other normal pregnancy, third trimester: Secondary | ICD-10-CM

## 2016-08-23 DIAGNOSIS — Z3480 Encounter for supervision of other normal pregnancy, unspecified trimester: Secondary | ICD-10-CM

## 2016-08-23 MED ORDER — RHO D IMMUNE GLOBULIN 1500 UNIT/2ML IJ SOSY
300.0000 ug | PREFILLED_SYRINGE | Freq: Once | INTRAMUSCULAR | Status: AC
  Administered 2016-08-23: 300 ug via INTRAMUSCULAR

## 2016-08-23 MED ORDER — TETANUS-DIPHTH-ACELL PERTUSSIS 5-2.5-18.5 LF-MCG/0.5 IM SUSP
0.5000 mL | Freq: Once | INTRAMUSCULAR | Status: AC
  Administered 2016-08-23: 0.5 mL via INTRAMUSCULAR

## 2016-08-23 MED ORDER — ZOLPIDEM TARTRATE 5 MG PO TABS: 5.0000 mg | ORAL_TABLET | Freq: Every evening | ORAL | 1 refills | Status: DC | PRN

## 2016-08-23 NOTE — Progress Notes (Signed)
Patient reports good fetal movement, denies pain. 

## 2016-08-23 NOTE — Progress Notes (Signed)
   PRENATAL VISIT NOTE  Subjective:  Diane [name] is a 21 y.o. G2P1001 at 2344w0d being seen today for ongoing prenatal care.  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  Patient reports no complaints.  Contractions: Not present. Vag. Bleeding: None.  Movement: Present. Denies leaking of fluid.   The following portions of the patient's history were reviewed and updated as appropriate: allergies, current medications, past family history, past medical history, past social history, past surgical history and problem list. Problem list updated.  Objective:   Vitals:   08/23/16 1357  BP: 113/73  Pulse: 96  Weight: 175 lb 6.4 oz (79.6 kg)    Fetal Status: Fetal Heart Rate (bpm): 132 (Simultaneous filing. User may not have seen previous data.) Fundal Height: 29 cm Movement: Present     General:  Alert, oriented and cooperative. Patient is in no acute distress.  Skin: Skin is warm and dry. No rash noted.   Cardiovascular: Normal heart rate noted  Respiratory: Normal respiratory effort, no problems with respiration noted  Abdomen: Soft, gravid, appropriate for gestational age. Pain/Pressure: Absent     Pelvic:  Cervical exam deferred        Extremities: Normal range of motion.  Edema: None  Mental Status: Normal mood and affect. Normal behavior. Normal judgment and thought content.   Assessment and Plan:  Pregnancy: G2P1001 at 2344w0d  1. Supervision of other normal pregnancy, antepartum Patient is doing well without complaints Normal 3rd trimester labs on 6/21- results reviewed Tdap today Patient is not taking  procardia for PTCx prescribed on 6/10 in MAU  2. Back pain affecting pregnancy in third trimester Patient is still waiting on PT appointment  3. 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 Rh negative state in antepartum period Rhogam today  Preterm labor symptoms and general obstetric precautions including but not limited to vaginal bleeding, contractions, leaking of fluid and fetal movement were reviewed in detail with the patient. Please refer to After Visit Summary for other counseling recommendations.  Return in about 2 weeks (around 09/06/2016) for ROB.   Catalina AntiguaPeggy Briele Lagasse, MD

## 2016-09-02 ENCOUNTER — Institutional Professional Consult (permissible substitution): Payer: BLUE CROSS/BLUE SHIELD

## 2016-09-07 ENCOUNTER — Ambulatory Visit (HOSPITAL_COMMUNITY)
Admission: RE | Admit: 2016-09-07 | Discharge: 2016-09-07 | Disposition: A | Discharge status: Home / Self Care | Payer: Medicaid Other | Attending: Psychiatry | Admitting: Psychiatry

## 2016-09-07 ENCOUNTER — Encounter: Payer: BLUE CROSS/BLUE SHIELD | Admitting: Obstetrics & Gynecology

## 2016-09-07 ENCOUNTER — Emergency Department (HOSPITAL_COMMUNITY)
Admission: EM | Admit: 2016-09-07 | Discharge: 2016-09-07 | Disposition: A | Discharge status: Other Acute Inpatient Hospital | Payer: BLUE CROSS/BLUE SHIELD | Attending: Emergency Medicine | Admitting: Emergency Medicine

## 2016-09-07 ENCOUNTER — Institutional Professional Consult (permissible substitution): Payer: BLUE CROSS/BLUE SHIELD

## 2016-09-07 ENCOUNTER — Encounter (HOSPITAL_COMMUNITY): Payer: Self-pay | Admitting: Emergency Medicine

## 2016-09-07 ENCOUNTER — Encounter: Payer: Self-pay | Admitting: Psychiatry

## 2016-09-07 ENCOUNTER — Inpatient Hospital Stay
Admission: AD | Admit: 2016-09-07 | Discharge: 2016-09-09 | DRG: 885 | Disposition: A | Discharge status: Home / Self Care | Payer: Medicaid Other | Source: Intra-hospital | Attending: Psychiatry | Admitting: Psychiatry

## 2016-09-07 DIAGNOSIS — F32A Depression, unspecified: Secondary | ICD-10-CM | POA: Diagnosis present

## 2016-09-07 DIAGNOSIS — Z818 Family history of other mental and behavioral disorders: Secondary | ICD-10-CM | POA: Diagnosis not present

## 2016-09-07 DIAGNOSIS — O26893 Other specified pregnancy related conditions, third trimester: Secondary | ICD-10-CM | POA: Insufficient documentation

## 2016-09-07 DIAGNOSIS — Z881 Allergy status to other antibiotic agents status: Secondary | ICD-10-CM

## 2016-09-07 DIAGNOSIS — Z888 Allergy status to other drugs, medicaments and biological substances status: Secondary | ICD-10-CM

## 2016-09-07 DIAGNOSIS — O99343 Other mental disorders complicating pregnancy, third trimester: Secondary | ICD-10-CM | POA: Diagnosis not present

## 2016-09-07 DIAGNOSIS — Z3A30 30 weeks gestation of pregnancy: Secondary | ICD-10-CM | POA: Diagnosis not present

## 2016-09-07 DIAGNOSIS — Z3490 Encounter for supervision of normal pregnancy, unspecified, unspecified trimester: Secondary | ICD-10-CM

## 2016-09-07 DIAGNOSIS — O99340 Other mental disorders complicating pregnancy, unspecified trimester: Secondary | ICD-10-CM | POA: Diagnosis present

## 2016-09-07 DIAGNOSIS — Z79899 Other long term (current) drug therapy: Secondary | ICD-10-CM | POA: Diagnosis not present

## 2016-09-07 DIAGNOSIS — F332 Major depressive disorder, recurrent severe without psychotic features: Secondary | ICD-10-CM | POA: Diagnosis not present

## 2016-09-07 DIAGNOSIS — G4700 Insomnia, unspecified: Secondary | ICD-10-CM | POA: Diagnosis present

## 2016-09-07 DIAGNOSIS — Z3A00 Weeks of gestation of pregnancy not specified: Secondary | ICD-10-CM | POA: Diagnosis not present

## 2016-09-07 DIAGNOSIS — R45851 Suicidal ideations: Secondary | ICD-10-CM | POA: Diagnosis present

## 2016-09-07 DIAGNOSIS — F329 Major depressive disorder, single episode, unspecified: Secondary | ICD-10-CM | POA: Diagnosis not present

## 2016-09-07 LAB — COMPREHENSIVE METABOLIC PANEL
ALBUMIN: 3.2 g/dL — AB (ref 3.5–5.0)
ALK PHOS: 78 U/L (ref 38–126)
ALT: 10 U/L — ABNORMAL LOW (ref 14–54)
ANION GAP: 11 (ref 5–15)
AST: 21 U/L (ref 15–41)
BILIRUBIN TOTAL: 1.2 mg/dL (ref 0.3–1.2)
BUN: 6 mg/dL (ref 6–20)
CALCIUM: 8.9 mg/dL (ref 8.9–10.3)
CO2: 22 mmol/L (ref 22–32)
Chloride: 103 mmol/L (ref 101–111)
Creatinine, Ser: 0.53 mg/dL (ref 0.44–1.00)
GFR calc Af Amer: >60 mL/min (ref >60)
GFR calc non Af Amer: >60 mL/min (ref >60)
GLUCOSE: 85 mg/dL (ref 65–99)
Potassium: 3.3 mmol/L — ABNORMAL LOW (ref 3.5–5.1)
Sodium: 136 mmol/L (ref 135–145)
TOTAL PROTEIN: 7.3 g/dL (ref 6.5–8.1)

## 2016-09-07 LAB — CBC
HEMATOCRIT: 32.8 % — AB (ref 36.0–46.0)
Hemoglobin: 11.5 g/dL — ABNORMAL LOW (ref 12.0–15.0)
MCH: 31.7 pg (ref 26.0–34.0)
MCHC: 35.1 g/dL (ref 30.0–36.0)
MCV: 90.4 fL (ref 78.0–100.0)
Platelets: 170 10*3/uL (ref 150–400)
RBC: 3.63 MIL/uL — ABNORMAL LOW (ref 3.87–5.11)
RDW: 12.2 % (ref 11.5–15.5)
WBC: 7.5 10*3/uL (ref 4.0–10.5)

## 2016-09-07 LAB — ETHANOL: Alcohol, Ethyl (B): <5 mg/dL (ref <5)

## 2016-09-07 LAB — ACETAMINOPHEN LEVEL

## 2016-09-07 LAB — RAPID URINE DRUG SCREEN, HOSP PERFORMED
Amphetamines: NOT DETECTED
BARBITURATES: NOT DETECTED
Benzodiazepines: NOT DETECTED
Cocaine: NOT DETECTED
Opiates: NOT DETECTED
Tetrahydrocannabinol: NOT DETECTED

## 2016-09-07 LAB — SALICYLATE LEVEL: Salicylate Lvl: <7 mg/dL (ref 2.8–30.0)

## 2016-09-07 MED ORDER — COMPLETENATE 29-1 MG PO CHEW
1.0000 | CHEWABLE_TABLET | Freq: Every day | ORAL | Status: DC
  Filled 2016-09-07: qty 1

## 2016-09-07 MED ORDER — ACETAMINOPHEN 325 MG PO TABS: 650.0000 mg | ORAL_TABLET | ORAL | Status: DC | PRN

## 2016-09-07 MED ORDER — MAGNESIUM HYDROXIDE 400 MG/5ML PO SUSP: 30.0000 mL | Freq: Every day | ORAL | Status: DC | PRN

## 2016-09-07 MED ORDER — ALUM & MAG HYDROXIDE-SIMETH 200-200-20 MG/5ML PO SUSP: 30.0000 mL | ORAL | Status: DC | PRN

## 2016-09-07 MED ORDER — PRENATAL MULTIVITAMIN CH
1.0000 | ORAL_TABLET | Freq: Every day | ORAL | Status: DC
  Administered 2016-09-07 – 2016-09-09 (×3): 1 via ORAL
  Filled 2016-09-07 (×3): qty 1

## 2016-09-07 MED ORDER — PRENATAL MULTIVITAMIN CH
1.0000 | ORAL_TABLET | Freq: Every day | ORAL | Status: DC
  Administered 2016-09-07: 1 via ORAL
  Filled 2016-09-07: qty 1

## 2016-09-07 MED ORDER — SERTRALINE HCL 100 MG PO TABS
100.0000 mg | ORAL_TABLET | Freq: Every day | ORAL | Status: DC
  Administered 2016-09-07 – 2016-09-08 (×2): 100 mg via ORAL
  Filled 2016-09-07 (×2): qty 1

## 2016-09-07 MED ORDER — SERTRALINE HCL 50 MG PO TABS
50.0000 mg | ORAL_TABLET | Freq: Every day | ORAL | Status: DC
  Administered 2016-09-07: 50 mg via ORAL
  Filled 2016-09-07: qty 1

## 2016-09-07 MED ORDER — ZOLPIDEM TARTRATE 5 MG PO TABS
5.0000 mg | ORAL_TABLET | Freq: Every day | ORAL | Status: DC
  Administered 2016-09-07 – 2016-09-08 (×2): 5 mg via ORAL
  Filled 2016-09-07 (×2): qty 1

## 2016-09-07 MED ORDER — POTASSIUM CHLORIDE CRYS ER 20 MEQ PO TBCR
40.0000 meq | EXTENDED_RELEASE_TABLET | Freq: Once | ORAL | Status: AC
  Administered 2016-09-07: 40 meq via ORAL
  Filled 2016-09-07: qty 2

## 2016-09-07 MED ORDER — COMPLETENATE 29-1 MG PO CHEW: 1.0000 | CHEWABLE_TABLET | Freq: Every day | ORAL | Status: DC

## 2016-09-07 MED ORDER — ACETAMINOPHEN 325 MG PO TABS: 650.0000 mg | ORAL_TABLET | Freq: Four times a day (QID) | ORAL | Status: DC | PRN

## 2016-09-07 NOTE — H&P (Signed)
Behavioral Health Medical Screening Exam  Diane KillianJalisa [name] is an 21 y.o. female, [facility]. She is endorsing depressive symptoms over the last two years which have  exacerbated in intensity and now coincide with her pregnancy. Pt states her stressors are coming from relational issues with her boyfriend and feeling overwhelmed with looking for a job and having conflict with childcare. She can no longer contract for safety but there is no endorsement of intent, plan or means. She does acknowledge a current, viable and healthy pregnancy of 5 months duration. Mrs Diane [name] is denying any acute ailments or emergency medical concerns at this present time.  Total Time spent with patient: 15 minutes  Psychiatric Specialty Exam: Physical Exam  Constitutional: She is oriented to person, place, and time. She appears well-developed and well-nourished. No distress.  HENT:  Head: Normocephalic.  Eyes: Pupils are equal, round, and reactive to light.  Respiratory: Effort normal and breath sounds normal.  Neurological: She is alert and oriented to person, place, and time. No cranial nerve deficit.  Skin: Skin is warm and dry. She is not diaphoretic.  Psychiatric: Her speech is normal and behavior is normal. Judgment normal. Her mood appears anxious. Cognition and memory are normal. She exhibits a depressed mood. She expresses suicidal ideation.    Review of Systems  Genitourinary:       Pregnant  Psychiatric/Behavioral: Positive for depression and suicidal ideas.  All other systems reviewed and are negative.   Last menstrual period 01/24/2016, not currently breastfeeding.There is no height or weight on file to calculate BMI.  General Appearance: Casual  Eye Contact:  Good  Speech:  Clear and Coherent  Volume:  Normal  Mood:  Depressed  Affect:  Congruent  Thought Process:  Goal Directed  Orientation:  Full (Time, Place, and Person)  Thought Content:  Negative  Suicidal  Thoughts:  Yes.  with intent/plan  Homicidal Thoughts:  No  Memory:  Immediate;   Good  Judgement:  Fair  Insight:  Fair  Psychomotor Activity:  Negative  Concentration: Concentration: Good  Recall:  Good  Fund of Knowledge:Good  Language: Good  Akathisia:  Negative  Handed:  Right  AIMS (if indicated):     Assets:  Desire for Improvement  Sleep:       Musculoskeletal: Strength & Muscle Tone: within normal limits Gait & Station: normal Patient leans: N/A  Last menstrual period 01/24/2016, not currently breastfeeding.  Recommendations:  Based on my evaluation the patient appears to have an emergency medical condition for which I recommend the patient be transferred to the emergency department for further evaluation.  Kerry HoughSpencer E Trude Cansler, PA-C 09/07/2016, 4:02 AM

## 2016-09-07 NOTE — Tx Team (Signed)
Initial Treatment Plan 09/07/2016 7:35 PM Diane [name] RUE:[id number]    PATIENT STRESSORS: Financial difficulties Loss of significant other Occupational concerns   PATIENT STRENGTHS: Ability for insight Active sense of humor Average or above average intelligence Capable of independent living Motivation for treatment/growth   PATIENT IDENTIFIED PROBLEMS: "To be able to take care of myself"  "develop better coping skills.                    DISCHARGE CRITERIA:  Improved stabilization in mood, thinking, and/or behavior  PRELIMINARY DISCHARGE PLAN: Return to previous living arrangement  PATIENT/FAMILY INVOLVEMENT: This treatment plan has been presented to and reviewed with the patient, Diane [name], and/or family member, .  The patient and family have been given the opportunity to ask questions and make suggestions.  [name], [name], RN 09/07/2016, 7:35 PM

## 2016-09-07 NOTE — BH Assessment (Signed)
Diane [name] is a 21 y/o single female who is a [facility].  Pt denies having intent and a plan. Pt states her stressors are coming from relational issues with her boyfriend and feeling overwhelmed with looking for a job and having conflict with childcare.  Pt also noted she was five months pregnant. Pt does not endorse AVH.  However the pt does endorse symptoms of depression (sad, isolation, fatigue, insomnia, self pity, tearfulness).  Pt is medication compliant.  She does not engage in therapy at this time, however, has been linked by her insurance company.  Diane [name] sts she resides with her boyfriend and son.  She can return home after discharge.  She states she has been trying to work on her relationship; however, her boyfriend was not.  Diane [name] sts it has been difficult finding work and juggling the adequate support needed to watch her son.  Additionally, she expressed being under financial distress.  She denies having any legal issues or upcoming charges.  Diane [name] presented with an unremarkable appearance.  She had poor eye contact and coherent speech.  Her mood was sad and depressed. Her affect was flat and congruent with her mood.  Her thought content was coherent.  Her judgement was impaired.  Her impulses appear good.  She was oriented to people, place, time, and situation.  Diane [name] is being recommended for an AM Psyche eval per Donell SievertSpencer Simon,  PA.

## 2016-09-07 NOTE — ED Provider Notes (Signed)
[facility]-EMERGENCY DEPT Provider Note   CSN: [id number] Arrival date & time: 09/07/16  0106     History   Chief Complaint Chief Complaint  Patient presents with  . Depression    HPI Diane [name] is a 21 y.o. female.  HPI  Patient to the ER from behavioral health for medical clearance. [facility]. No plan or intent. She is [redacted] weeks pregnant. She has received prenatal care for her baby and does not have any complications. She is having relationship and financial problems. Does not do drugs or drink alcohol. No medical complaints at this time. No hallucinations.   [facility], she is being recommended for an AM psyche eval per Donell SievertSpencer, Simon, PA.  Past Medical History:  Diagnosis Date  . Anemia   . Anxiety   . Headache   . UTI (lower urinary tract infection)     Patient Active Problem List   Diagnosis Date Noted  . Back pain affecting pregnancy 07/21/2016  . Encounter for supervision of normal pregnancy, antepartum 04/14/2016  . Short interval between pregnancies affecting pregnancy, antepartum 04/14/2016  . Anxiety 03/29/2015  . Rh negative state in antepartum period 03/29/2015  . 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 Depression affecting pregnancy, antepartum--on Zoloft 03/29/2015    History reviewed. No pertinent surgical history.  OB History    Gravida Para Term Preterm AB Living   2 1 1     1    SAB TAB Ectopic Multiple Live Births         0 1       Home Medications    Prior to Admission medications   Medication Sig Start Date End Date Taking? Authorizing Provider  acetaminophen (TYLENOL) 500 MG tablet Take 1,000 mg by mouth every 6 (six) hours as needed for moderate pain.    [provider]  ondansetron (ZOFRAN ODT) 8 MG disintegrating tablet Take 1 tablet (8 mg total) by mouth every 8 (eight) hours as needed for nausea or vomiting. Patient not taking: Reported on 08/23/2016 07/18/16   Currie ParisBurleson, Terri L, NP    Prenatal Vit-Fe Fumarate-FA (PRENATAL MULTIVITAMIN) TABS tablet Take 1 tablet by mouth daily at 12 noon. 06/21/16   Constant, Peggy, MD  sertraline (ZOLOFT) 50 MG tablet Take 1 tablet (50 mg total) by mouth daily. 06/21/16   Constant, Peggy, MD  zolpidem (AMBIEN) 5 MG tablet Take 1 tablet (5 mg total) by mouth at bedtime as needed for sleep. 08/23/16   Constant, Peggy, MD    Family History Family History  Problem Relation Age of Onset  . Hypertension Mother   . Hypertension Maternal Grandmother   . Diabetes Maternal Grandmother   . Congestive Heart Failure Maternal Grandmother   . Hypertension Maternal Uncle   . Diabetes Maternal Uncle   . Hypertension Maternal Uncle   . Diabetes Maternal Uncle     Social History Social History  Substance Use Topics  . Smoking status: Never Smoker  . Smokeless tobacco: Never Used  . Alcohol use No     Allergies   Cefzil [cefprozil] and Rocephin [ceftriaxone sodium in dextrose]   Review of Systems Review of Systems The patient denies anorexia, fever, weight loss,, vision loss, decreased hearing, hoarseness, chest pain, syncope, dyspnea on exertion, peripheral edema, balance deficits, hemoptysis, abdominal pain, melena, hematochezia, severe indigestion/heartburn, hematuria, incontinence, genital sores, muscle weakness, suspicious skin lesions, transient blindness, difficulty walking,  unusual weight change, abnormal bleeding, enlarged lymph nodes, angioedema, and  breast masses.   Physical Exam Updated Vital Signs BP 107/70 (BP Location: Left Arm)   Pulse (!) 102   Temp 98 F (36.7 C) (Oral)   Resp 18   LMP 01/24/2016   SpO2 100%   Physical Exam  Constitutional: She appears well-developed and well-nourished.  HENT:  Head: Normocephalic and atraumatic.  Eyes: Conjunctivae are normal. Pupils are equal, round, and reactive to light.  Neck: Trachea normal, normal range of motion and full passive range of motion without pain. Neck supple.   Cardiovascular: Normal rate, regular rhythm and normal pulses.   Pulmonary/Chest: Effort normal and breath sounds normal. Chest wall is not dull to percussion. She exhibits no tenderness, no crepitus, no edema, no deformity and no retraction.  Abdominal: Soft. Normal appearance and bowel sounds are normal.  abd gravid  Musculoskeletal: Normal range of motion.  Neurological: She is alert. She has normal strength.  Skin: Skin is warm, dry and intact.  Psychiatric: She has a normal mood and affect. Her speech is normal and behavior is normal. Judgment and thought content normal. Cognition and memory are normal.     ED Treatments / Results  Labs (all labs ordered are listed, but only abnormal results are displayed) Labs Reviewed  COMPREHENSIVE METABOLIC PANEL - Abnormal; Notable for the following:       Result Value   Potassium 3.3 (*)    Albumin 3.2 (*)    ALT 10 (*)    All other components within normal limits  ACETAMINOPHEN LEVEL - Abnormal; Notable for the following:    Acetaminophen (Tylenol), Serum <10 (*)    All other components within normal limits  CBC - Abnormal; Notable for the following:    RBC 3.63 (*)    Hemoglobin 11.5 (*)    HCT 32.8 (*)    All other components within normal limits  ETHANOL  SALICYLATE LEVEL  RAPID URINE DRUG SCREEN, HOSP PERFORMED    EKG  EKG Interpretation None       Radiology No results found.  Procedures Procedures (including critical care time)  Medications Ordered in ED Medications  potassium chloride SA (K-DUR,KLOR-CON) CR tablet 40 mEq (not administered)     Initial Impression / Assessment and Plan / ED Course  I have reviewed the triage vital signs and the nursing notes.  Pertinent labs & imaging results that were available during my care of the patient were reviewed by me and considered in my medical decision making (see chart for details).     [facility] recommendation was for a morning psych evaluation The patient is  medically cleared at this time. Potassium is mildly low, replaced. She has been made aware she is anemic and iron supplements recommended when she returns home.  Will place on psych hold and ask psych to  eval this AM. Not actively suicidal.  Final Clinical Impressions(s) / ED Diagnoses   Final diagnoses:  Depression, unspecified depression type  Pregnancy, unspecified gestational age    [place] Prescriptions New Prescriptions   No medications on file     Marlon Pel, PA-C 09/07/16 [id number]    Paula Libra, MD 09/07/16 [phone]

## 2016-09-07 NOTE — ED Notes (Signed)
Pt belongings placed in the cabinets above the sink in 1-8 section. (flower book bag, pt belonging bag)

## 2016-09-07 NOTE — [facility] Group Notes (Signed)
[facility] Group Notes:  (Nursing/MHT/Case Management/Adjunct)  Date:  09/07/2016  Time:  11:12 PM  Type of Therapy:  Group Therapy  Participation Level:  Active  Participation Quality:  Appropriate  Affect:  Appropriate  Cognitive:  Appropriate  Insight:  Good  Engagement in Group:  Engaged  Modes of Intervention:  Support  Summary of Progress/Problems:  Diane [name] 09/07/2016, 11:12 PM

## 2016-09-07 NOTE — ED Notes (Signed)
[employer] broadcast assistantTransport operator informed that we request another driver other than driver Cyril MourningLinda Moyer, because she is a mother-in-law of the pt. States they have another driver.

## 2016-09-07 NOTE — ED Notes (Signed)
Waiting on ED provider to see

## 2016-09-07 NOTE — [facility] Suicide Risk Assessment (Signed)
[facility]   Nursing information obtained from:    Demographic factors:    Current Mental Status:    Loss Factors:    Historical Factors:    Risk Reduction Factors:     Total Time spent with patient: 1 hour Principal Problem: Major depressive disorder, recurrent severe without psychotic features (HCC) Diagnosis:   Patient Active Problem List   Diagnosis Date Noted  . 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 Pregnant [Z34.90] 09/07/2016  . Back pain affecting pregnancy [O26.899, M54.9] 07/21/2016  . Encounter for supervision of normal pregnancy, antepartum [Z34.90] 04/14/2016  . Short interval between pregnancies affecting pregnancy, antepartum [O09.899] 04/14/2016  . Anxiety [F41.9] 03/29/2015  . Rh negative state in antepartum period [O09.899, Z67.91] 03/29/2015  . Allergy to multiple antibiotics--Rocephin, Cefzil [Z88.1] 03/29/2015  . Depression affecting pregnancy, antepartum--on Zoloft [O99.340, F32.9] 03/29/2015   Subjective Data: suicidal ideation.  Continued Clinical Symptoms:    The "Alcohol Use Disorders Identification Test", Guidelines for Use in Primary Care, Second Edition.  World Science writerHealth Organization [facility](WHO). Score between 0-7:  no or low risk or alcohol related problems. Score between 8-15:  moderate risk of alcohol related problems. Score between 16-19:  high risk of alcohol related problems. Score 20 or above:  warrants further diagnostic evaluation for alcohol dependence and treatment.   CLINICAL FACTORS:   Depression:   Insomnia   Musculoskeletal: Strength & Muscle Tone: within normal limits Gait & Station: normal Patient leans: N/A  Psychiatric Specialty Exam: Physical Exam  Nursing note and vitals reviewed. Psychiatric: Her speech is normal. Judgment normal. She is withdrawn. Cognition and memory are normal. She exhibits a depressed mood. She expresses suicidal  ideation. She expresses suicidal plans.    Review of Systems  Psychiatric/Behavioral: Positive for depression and suicidal ideas. The patient has insomnia.   All other systems reviewed and are negative.   Last menstrual period 01/24/2016, not currently breastfeeding.There is no height or weight on file to calculate BMI.  General Appearance: Casual  Eye Contact:  Good  Speech:  Clear and Coherent  Volume:  Decreased  Mood:  Depressed  Affect:  Blunt  Thought Process:  Goal Directed and Descriptions of Associations: Intact  Orientation:  Full (Time, Place, and Person)  Thought Content:  WDL  Suicidal Thoughts:  Yes.  with intent/plan  Homicidal Thoughts:  No  Memory:  Immediate;   Fair Recent;   Fair Remote;   Fair  Judgement:  Fair  Insight:  Fair  Psychomotor Activity:  Decreased  Concentration:  Concentration: Fair and Attention Span: Fair  Recall:  [employer]Fair  Fund of Knowledge:  Fair  Language:  Fair  Akathisia:  No  Handed:  Right  AIMS (if indicated):     Assets:  Communication Skills Desire for Improvement Financial Resources/Insurance Housing Intimacy Physical Health Resilience Social Support Vocational/Educational  ADL's:  Intact  Cognition:  WNL  Sleep:         COGNITIVE FEATURES THAT CONTRIBUTE TO RISK:  None    SUICIDE RISK:   Minimal: No identifiable suicidal ideation.  Patients presenting with no risk factors but with morbid ruminations; may be classified as minimal risk based on the severity of the depressive symptoms  PLAN OF CARE: Hospital admission, medication management, discharge planning.  Diane [name] is a 21 year old pregnant female with a history of depression admitted for suicidal ideation.  1. Suicidal ideation. The patient is able to contract for safety in the  hospital.  2. Depression. We will increase Zoloft 200 mg nightly.  3. Insomnia. We will continue Ambien 5 mg.  4. Pregnancy. She is in the [facility] in [place] last visit  was on June 25. We will continue prenatal vitamins.  5. Social. There is a conflict with a boyfriend. Family meeting would be preferable.  6. Disposition. She will be discharged to home. She will follow up with her obstetrician and a therapist.   I certify that inpatient services furnished can reasonably be expected to improve the patient's condition.   Kristine Linea, MD 09/07/2016, 6:53 PM

## 2016-09-07 NOTE — BH Assessment (Signed)
Diane [name] is a 21 y/o single female who is a walk in at [facility] due to SI.  Pt denies having intent and a plan. Pt states her stressors are coming from relational issues with her boyfriend and feeling overwhelmed with looking for a job and having conflict with childcare.  Pt also noted she was five months pregnant. Pt does not endorse AVH.  However the pt does endorse symptoms of depression (sad, isolation, fatigue, insomnia, self pity, tearfulness).  Pt is medication compliant.  She does not engage in therapy at this time, however, has been linked by her insurance company.  Diane [name] sts she resides with her boyfriend and son.  She can return home after discharge.  She states she has been trying to work on her relationship; however, her boyfriend was not.  Diane [name] sts it has been difficult finding work and juggling the adequate support needed to watch her son.  Additionally, she expressed being under financial distress.  She denies having any legal issues or upcoming charges.  Diane [name] presented with an unremarkable appearance.  She had poor eye contact and coherent speech.  Her mood was sad and depressed. Her affect was flat and congruent with her mood.  Her thought content was coherent.  Her judgement was impaired.  Her impulses appear good.  She was oriented to people, place, time, and situation.  Diane [name] is being recommended for an AM Psyche eval per Spencer Simon,  PA. 

## 2016-09-07 NOTE — BH Assessment (Signed)
Tele Assessment Note *all information copied from [facility] [name] [facility] walk in assessment    Diane [name] is an 21 y.o. female who is a walk in at [facility] due to [facility].  Pt denies having intent and a plan. Pt states her stressors are coming from relational issues with her boyfriend and feeling overwhelmed with looking for a job and having conflict with childcare.  Pt also noted she was five months pregnant. Pt does not endorse AVH.  However the pt does endorse symptoms of depression (sad, isolation, fatigue, insomnia, self pity, tearfulness).  Pt is medication compliant.  She does not engage in therapy at this time, however, has been linked by her insurance company.  [name] sts she resides with her boyfriend and son.  She can return home after discharge.  She states she has been trying to work on her relationship; however, her boyfriend was not.  [name] sts it has been difficult finding work and juggling the adequate support needed to watch her son.  Additionally, she expressed being under financial distress.  She denies having any legal issues or upcoming charges.  Diane [name] presented with an unremarkable appearance.  She had poor eye contact and coherent speech.  Her mood was sad and depressed. Her affect was flat and congruent with her mood.  Her thought content was coherent.  Her judgement was impaired.  Her impulses appear good.  She was oriented to people, place, time, and situation.  [name] is being recommended for an AM Psyche eval per Donell Sievert,  PA.  Diagnosis: Major Depressive Disorder Single Episode Severe   Past Medical History:  Past Medical History:  Diagnosis Date  . Anemia   . Anxiety   . Headache   . UTI (lower urinary tract infection)     History reviewed. No pertinent surgical history.  Family History:  Family History  Problem Relation Age of Onset  . Hypertension Mother   . Hypertension Maternal Grandmother   . Diabetes Maternal Grandmother   . Congestive Heart  Failure Maternal Grandmother   . Hypertension Maternal Uncle   . Diabetes Maternal Uncle   . Hypertension Maternal Uncle   . Diabetes Maternal Uncle     Social History:  reports that she has never smoked. She has never used smokeless tobacco. She reports that she does not drink alcohol or use drugs.  Additional Social History:     CIWA: CIWA-Ar BP: (!) 104/56 Pulse Rate: 94 COWS:    PATIENT STRENGTHS: (choose at least two) Average or above average intelligence Physical Health  Allergies:  Allergies  Allergen Reactions  . Cefzil [Cefprozil] Hives  . Rocephin [Ceftriaxone Sodium In Dextrose] Hives    Home Medications:  (Not in a hospital admission)  OB/GYN Status:  Patient's last menstrual period was 01/24/2016.  General Assessment Data Location of Assessment: [facility] Assessment Services TTS Assessment: In system Is this a Tele or Face-to-Face Assessment?: Face-to-Face Is this an Initial Assessment or a Re-assessment for this encounter?: Initial Assessment Pregnancy Status: Yes (Comment: include estimated delivery date) Living Arrangements: Spouse/significant other, Children Can pt return to current living arrangement?: Yes Admission Status: Voluntary Is patient capable of signing voluntary admission?: Yes Referral Source: Self/Family/Friend Insurance type: BCBS   Medical Screening Exam [facility] Walk-in ONLY) Medical Exam completed: Yes  Crisis Care Plan Living Arrangements: Spouse/significant other, Children Name of Psychiatrist: None reported Name of Therapist: None reported  Education Status Highest grade of school patient has completed: some college  Risk to self with the  past 6 months Suicidal Ideation: Yes-Currently Present Has patient been a risk to self within the past 6 months prior to admission? : No Suicidal Intent: Yes-Currently Present Has patient had any suicidal intent within the past 6 months prior to admission? : No Is patient at risk for suicide?:  Yes Suicidal Plan?: No Has patient had any suicidal plan within the past 6 months prior to admission? : No Access to Means: No What has been your use of drugs/alcohol within the last 12 months?: none reported Previous Attempts/Gestures: No How many times?: 0 Other Self Harm Risks: none reported Triggers for Past Attempts: None known Intentional Self Injurious Behavior: None Family Suicide History: No Recent stressful life event(s): Financial Problems Persecutory voices/beliefs?: No Depression: Yes Depression Symptoms: Despondent, Insomnia, Tearfulness, Isolating, Loss of interest in usual pleasures Substance abuse history and/or treatment for substance abuse?: No Suicide prevention information given to non-admitted patients: Not applicable  Risk to Others within the past 6 months Homicidal Ideation: No Does patient have any lifetime risk of violence toward others beyond the six months prior to admission? : No Thoughts of Harm to Others: No Current Homicidal Intent: No Current Homicidal Plan: No Access to Homicidal Means: No Identified Victim: NA History of harm to others?: No Assessment of Violence: None Noted Violent Behavior Description: no Does patient have access to weapons?: No Criminal Charges Pending?: No Does patient have a court date: No Is patient on probation?: No  Psychosis Hallucinations: None noted Delusions: None noted  Mental Status Report Appearance/Hygiene: Unremarkable Eye Contact: Poor Motor Activity: Unremarkable Speech: Logical/coherent, Soft Level of Consciousness: Alert Mood: Depressed Affect: Sad, Depressed Anxiety Level: None Thought Processes: Coherent Judgement: Impaired Orientation: Person, Place, Time, Situation Obsessive Compulsive Thoughts/Behaviors: None  Cognitive Functioning Concentration: Normal Memory: Recent Intact, Recent Impaired IQ: Average Insight: Fair Impulse Control: Good Appetite: Poor Weight Loss: 5 Weight  Gain: 0 Sleep: Decreased Total Hours of Sleep: 4 Vegetative Symptoms: None  ADLScreening [facility] Assessment Services) Patient's cognitive ability adequate to safely complete daily activities?: Yes Patient able to express need for assistance with ADLs?: Yes Independently performs ADLs?: Yes (appropriate for developmental age)  Prior Inpatient Therapy Prior Inpatient Therapy: Yes Prior Therapy Dates: cannot recall Prior Therapy Facilty/Provider(s): cannot recall Reason for Treatment: Post pardum  Prior Outpatient Therapy Prior Outpatient Therapy: No Prior Therapy Dates: na Prior Therapy Facilty/Provider(s): na Reason for Treatment: na Does patient have an ACCT team?: No Does patient have Intensive In-House Services?  : No Does patient have [facility] services? : No Does patient have P4CC services?: No  ADL Screening (condition at time of admission) Patient's cognitive ability adequate to safely complete daily activities?: Yes Patient able to express need for assistance with ADLs?: Yes Independently performs ADLs?: Yes (appropriate for developmental age)             Merchant navy officerAdvance Directives (For Healthcare) Does Patient Have a Medical Advance Directive?: No Would patient like information on creating a medical advance directive?: No - Patient declined    Additional Information 1:1 In Past 12 Months?: No CIRT Risk: No Elopement Risk: No Does patient have medical clearance?: Yes     Disposition:  Disposition Disposition of Patient: Other dispositions (AM Psyche Eval per Donell SievertSpencer Simon PA)  Lanice ShirtsKristin M Kilie Rund 09/07/2016 2:10 PM

## 2016-09-07 NOTE — BH Specialist Note (Deleted)
Integrated Behavioral Health Initial Visit  MRN: [id number] Name: Diane [name]   Session Start time: *** Session End time: *** Total time: {IBH Total Time:21014050}  Type of Service: Integrated Behavioral Health- Individual/Family Interpretor:{yes WU:[id number]}no:314532} Interpretor Name and Language: ***   Warm Hand Off Completed.       SUBJECTIVE: Diane [name] is a 21 y.o. female accompanied by {Persons; PED relatives w/patient:19415}. Patient was referred by *** for ***. Patient reports the following symptoms/concerns: *** Duration of problem: ***; Severity of problem: {Mild/Moderate/Severe:20260}  OBJECTIVE: Mood: {BHH MOOD:22306} and Affect: {BHH AFFECT:22307} Risk of harm to self or others: {CHL AMB BH Suicide Current Mental Status:21022748}   LIFE CONTEXT: Family and Social: *** School/Work: *** Self-Care: *** Life Changes: ***  GOALS ADDRESSED: Patient will reduce symptoms of: {IBH Symptoms:21014056} and increase knowledge and/or ability of: {IBH Patient Tools:21014057} and also: {IBH Goals:21014053}   INTERVENTIONS: {IBH Interventions:21014054}  Standardized Assessments completed: {IBH Screening Tools:21014051}  ASSESSMENT: Patient currently experiencing ***. Patient may benefit from ***.  PLAN: 1. Follow up with behavioral health clinician on : *** 2. Behavioral recommendations: *** 3. Referral(s): {IBH Referrals:21014055} 4. "From scale of 1-10, how likely are you to follow plan?": ***  Jamie C McMannes, LCSWA

## 2016-09-07 NOTE — ED Notes (Signed)
Pt given a blanket and pillow for comfort

## 2016-09-07 NOTE — H&P (Signed)
Psychiatric Admission Assessment Adult  Patient Identification: Diane [name] MRN:  [id number] Date of Evaluation:  09/07/2016 Chief Complaint:  depression Principal Diagnosis: Major depressive disorder, recurrent severe without psychotic features ([place]) Diagnosis:   Patient Active Problem List   Diagnosis Date Noted  . Major depressive disorder, recurrent severe without psychotic features ([place]) [F33.2] 09/07/2016  . Pregnant [Z34.90] 09/07/2016  . Back pain affecting pregnancy [O26.899, M54.9] 07/21/2016  . Encounter for supervision of normal pregnancy, antepartum [Z34.90] 04/14/2016  . Short interval between pregnancies affecting pregnancy, antepartum [O09.899] 04/14/2016  . Anxiety [F41.9] 03/29/2015  . Rh negative state in antepartum period [O09.899, Z67.91] 03/29/2015  . Allergy to multiple antibiotics--Rocephin, Cefzil [Z88.1] 03/29/2015  . Depression affecting pregnancy, antepartum--on Zoloft [O99.340, F32.9] 03/29/2015   History of Present Illness:   Identifying data. Diane [name] is a 21 year old female with history of depression.  Chief complaint. "The way I felt I needed to come to the hospital."  History of present illness. Information was obtained from the patient and the chart. The patient came to the emergency room complaining of suicidal ideation that were clearly echo dystonic. She did not have a plan but had thoughts of not waking up or dying in a car accident. She felt unsafe. Insulin-dependent that she had her first appointment scheduled by her obstetrician with a therapist today but she decided that it was safer to come to the hospital. The patient reports that for the past 2 years she has been depressed. Her depression started with her first pregnancy. She has been maintained on Zoloft initially 25 and then 50 mg daily with excellent results. 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 She reports many symptoms of depression with poor sleep, decreased appetite, anhedonia, feeling of guilt and hopelessness worthlessness, poor energy and concentration, social isolation, and crying spells. Suicidal thinking is new to her. She denies any thoughts of hurting her baby. She denies psychotic symptoms or symptoms suggestive of bipolar mania. She has a history of panic attacks but not recently. There is no social anxiety, PTSD, OCD symptoms. She does not use alcohol or drugs.  Past psychiatric history. She reports first feeling depressed at the age of 43 but has not been treated until later. She did not have traumatic experiences growing up but did not know her father until she was grown. She has never been hospitalized, attempted suicide or been treated except for the 2 year period recently.  Family psychiatric history. Nonreported.  Social history. She graduated from high school and did show some time in photography. She was asked to work on her senior year book. She signed up for photography classes that started at the beginning of July but due to turmoil in her life will have to delay it after the baby is born. She is a stay-at-home mom. But will look for jobs that she can go from home. She receives food stamps. She lives with the father of both of her children. Unfortunately he pays child support to another family and they experienced tremendous financial difficulties.   Total Time spent with patient: 1 hour  Is the patient at risk to self? Yes.    Has the patient been a risk to self in the past 6 months? No.  Has the patient been a risk to self within the distant past?  No.  Is the patient a risk to others? No.  Has the patient been a risk to others in the past 6 months? No.  Has the patient been a risk to others within the distant past? No.   Prior Inpatient Therapy:    Prior Outpatient Therapy:    Alcohol Screening:   Substance Abuse History in the last 12 months:  No. Consequences of Substance Abuse: NA Previous Psychotropic Medications: Yes  Psychological Evaluations: No  Past Medical History:  Past Medical History:  Diagnosis Date  . Anemia   . Anxiety   . Headache   . UTI (lower urinary tract infection)    History reviewed. No pertinent surgical history. Family History:  Family History  Problem Relation Age of Onset  . Hypertension Mother   . Hypertension Maternal Grandmother   . Diabetes Maternal Grandmother   . Congestive Heart Failure Maternal Grandmother   . Hypertension Maternal Uncle   . Diabetes Maternal Uncle   . Hypertension Maternal Uncle   . Diabetes Maternal Uncle     Tobacco Screening:   Social History:  History  Alcohol Use No     History  Drug Use No    Additional Social History:                           Allergies:   Allergies  Allergen Reactions  . Cefzil [Cefprozil] Hives  . Rocephin [Ceftriaxone Sodium In Dextrose] Hives   Lab Results:  Results for orders placed or performed during the hospital encounter of 09/07/16 (from the past 48 hour(s))  Comprehensive metabolic panel     Status: Abnormal   Collection Time: 09/07/16  1:41 AM  Result Value Ref Range   Sodium 136 135 - 145 mmol/L   Potassium 3.3 (L) 3.5 - 5.1 mmol/L   Chloride 103 101 - 111 mmol/L   CO2 22 22 - 32 mmol/L   Glucose, Bld 85 65 - 99 mg/dL   BUN 6 6 - 20 mg/dL   Creatinine, Ser 0.53 0.44 - 1.00 mg/dL   Calcium 8.9 8.9 - 10.3 mg/dL   Total Protein 7.3 6.5 - 8.1 g/dL   Albumin 3.2 (L) 3.5 - 5.0 g/dL   AST 21 15 - 41 U/L   ALT 10 (L) 14 - 54 U/L   Alkaline Phosphatase 78 38 - 126 U/L   Total Bilirubin 1.2 0.3 - 1.2 mg/dL   GFR calc non Af Amer >60 >60 mL/min   GFR calc Af Amer >60 >60 mL/min    Comment: (NOTE) The eGFR has been calculated using the CKD EPI equation. This calculation has not been validated in all  clinical situations. eGFR's persistently <60 mL/min signify possible Chronic Kidney Disease.    Anion gap 11 5 - 15  Ethanol     Status: None   Collection Time: 09/07/16  1:41 AM  Result Value Ref Range   Alcohol, Ethyl (B) <5 <5 mg/dL    Comment:        LOWEST DETECTABLE LIMIT FOR SERUM ALCOHOL IS 5 mg/dL FOR MEDICAL PURPOSES ONLY   Salicylate level     Status: None   Collection Time: 09/07/16  1:41 AM  Result Value Ref Range   Salicylate Lvl <1.7 2.8 - 30.0 mg/dL  Acetaminophen level     Status: Abnormal   Collection Time: 09/07/16  1:41 AM  Result Value Ref Range   Acetaminophen (Tylenol), Serum <10 (L) 10 - 30  ug/mL    Comment:        THERAPEUTIC CONCENTRATIONS VARY SIGNIFICANTLY. A RANGE OF 10-30 ug/mL MAY BE AN EFFECTIVE CONCENTRATION FOR MANY PATIENTS. HOWEVER, SOME ARE BEST TREATED AT CONCENTRATIONS OUTSIDE THIS RANGE. ACETAMINOPHEN CONCENTRATIONS >150 ug/mL AT 4 HOURS AFTER INGESTION AND >50 ug/mL AT 12 HOURS AFTER INGESTION ARE OFTEN ASSOCIATED WITH TOXIC REACTIONS.   cbc     Status: Abnormal   Collection Time: 09/07/16  1:41 AM  Result Value Ref Range   WBC 7.5 4.0 - 10.5 K/uL   RBC 3.63 (L) 3.87 - 5.11 MIL/uL   Hemoglobin 11.5 (L) 12.0 - 15.0 g/dL   HCT 32.8 (L) 36.0 - 46.0 %   MCV 90.4 78.0 - 100.0 fL   MCH 31.7 26.0 - 34.0 pg   MCHC 35.1 30.0 - 36.0 g/dL   RDW 12.2 11.5 - 15.5 %   Platelets 170 150 - 400 K/uL  Rapid urine drug screen (hospital performed)     Status: None   Collection Time: 09/07/16  2:24 AM  Result Value Ref Range   Opiates NONE DETECTED NONE DETECTED   Cocaine NONE DETECTED NONE DETECTED   Benzodiazepines NONE DETECTED NONE DETECTED   Amphetamines NONE DETECTED NONE DETECTED   Tetrahydrocannabinol NONE DETECTED NONE DETECTED   Barbiturates NONE DETECTED NONE DETECTED    Comment:        DRUG SCREEN FOR MEDICAL PURPOSES ONLY.  IF CONFIRMATION IS NEEDED FOR ANY PURPOSE, NOTIFY LAB WITHIN 5 DAYS.        LOWEST DETECTABLE  LIMITS FOR URINE DRUG SCREEN Drug Class       Cutoff (ng/mL) Amphetamine      1000 Barbiturate      200 Benzodiazepine   076 Tricyclics       226 Opiates          300 Cocaine          300 THC              50     Blood Alcohol level:  Lab Results  Component Value Date   ETH <5 33/35/4562    Metabolic Disorder Labs:  No results found for: HGBA1C, MPG No results found for: PROLACTIN No results found for: CHOL, TRIG, HDL, CHOLHDL, VLDL, LDLCALC  Current Medications: Current Facility-Administered Medications  Medication Dose Route Frequency Provider Last Rate Last Dose  . acetaminophen (TYLENOL) tablet 650 mg  650 mg Oral Q6H PRN Titilayo Hagans B, MD      . alum & mag hydroxide-simeth (MAALOX/MYLANTA) 200-200-20 MG/5ML suspension 30 mL  30 mL Oral Q4H PRN Darcel Frane B, MD      . magnesium hydroxide (MILK OF MAGNESIA) suspension 30 mL  30 mL Oral Daily PRN Emiah Pellicano B, MD      . prenatal vitamin w/FE, FA (NATACHEW) chewable tablet 1 tablet  1 tablet Oral QHS Gurpreet Mikhail B, MD      . sertraline (ZOLOFT) tablet 100 mg  100 mg Oral QHS Alena Blankenbeckler B, MD      . zolpidem (AMBIEN) tablet 5 mg  5 mg Oral QHS Shahan Starks B, MD       PTA Medications: Prescriptions Prior to Admission  Medication Sig Dispense Refill Last Dose  . acetaminophen (TYLENOL) 500 MG tablet Take 1,000 mg by mouth every 6 (six) hours as needed for mild pain, moderate pain, fever or headache.    Past Month at Unknown time  . ondansetron (ZOFRAN ODT) 8 MG disintegrating  tablet Take 1 tablet (8 mg total) by mouth every 8 (eight) hours as needed for nausea or vomiting. 6 tablet 0 Past Month at Unknown time  . Prenatal Vit-Fe Fumarate-FA (PRENATAL MULTIVITAMIN) TABS tablet Take 1 tablet by mouth daily at 12 noon. (Patient taking differently: Take 1 tablet by mouth at bedtime. ) 1 tablet 10 09/06/2016 at Unknown time  . sertraline (ZOLOFT) 50 MG tablet Take 1 tablet (50 mg total)  by mouth daily. (Patient taking differently: Take 50 mg by mouth at bedtime. ) 30 tablet 3 09/06/2016 at Unknown time  . zolpidem (AMBIEN) 5 MG tablet Take 1 tablet (5 mg total) by mouth at bedtime as needed for sleep. 30 tablet 1 09/06/2016 at Unknown time    Musculoskeletal: Strength & Muscle Tone: within normal limits Gait & Station: normal Patient leans: N/A  Psychiatric Specialty Exam: I reviewed physical examination performed in the emergency room and agree with the findings. Physical Exam  Nursing note and vitals reviewed. Psychiatric: Her speech is normal. Judgment normal. She is withdrawn. Cognition and memory are normal. She exhibits a depressed mood. She expresses suicidal ideation. She expresses suicidal plans.    Review of Systems  Psychiatric/Behavioral: Positive for depression and suicidal ideas. The patient has insomnia.   All other systems reviewed and are negative.   Last menstrual period 01/24/2016, not currently breastfeeding.There is no height or weight on file to calculate BMI.  See SRA.                                                  Sleep:       Treatment Plan Summary: Daily contact with patient to assess and evaluate symptoms and progress in treatment and Medication management   Ms. Tryon is a 21 year old pregnant female with a history of depression admitted for suicidal ideation.  1. Suicidal ideation. The patient is able to contract for safety in the hospital.  2. Depression. We will increase Zoloft 200 mg nightly.  3. Insomnia. We will continue Ambien 5 mg.  4. Pregnancy. She is in the [facility] in [place] last visit was on June 25. We will continue prenatal vitamins.  5. Social. There is a conflict with a boyfriend. Family meeting would be preferable.  6. Disposition. She will be discharged to home. She will follow up with her obstetrician and a therapist.    Observation Level/Precautions:  15 minute checks   Laboratory:  CBC Chemistry Profile UDS UA  Psychotherapy:    Medications:    Consultations:    Discharge Concerns:    Estimated LOS:  Other:     Physician Treatment Plan for Primary Diagnosis: Major depressive disorder, recurrent severe without psychotic features ([place]) Long Term Goal(s): Improvement in symptoms so as ready for discharge  Short Term Goals: Ability to identify changes in lifestyle to reduce recurrence of condition will improve, Ability to verbalize feelings will improve, Ability to disclose and discuss suicidal ideas, Ability to demonstrate self-control will improve, Ability to identify and develop effective coping behaviors will improve, Ability to maintain clinical measurements within normal limits will improve and Ability to identify triggers associated with substance abuse/mental health issues will improve  Physician Treatment Plan for Secondary Diagnosis: Principal Problem:   Major depressive disorder, recurrent severe without psychotic features ([place]) Active Problems:   Pregnant  Long Term Goal(s): NA  Short Term Goals: NA  I certify that inpatient services furnished can reasonably be expected to improve the patient's condition.    Orson Slick, MD 7/10/20186:58 PM

## 2016-09-07 NOTE — ED Triage Notes (Signed)
[facility]  Pt states they do not have any female beds  Pt states she went there for depression  Pt states she does not feel suicidal per say but states it would be okay if she did not wake up or if she were in a car accident or something

## 2016-09-07 NOTE — ED Notes (Signed)
Pt placed in scrubs and wanded by security  

## 2016-09-07 NOTE — Progress Notes (Signed)
21 year old admitted with MDD.  Arrived on unit in scrubs.  Patient is tearful with flat affect.  Verbalizes that she feels overwhelmed and that everything is coming down on her all at once.  Passive SI.  States that she just wishes she would not wake up again. Body search and skin assessment performed.  No contraband found. Skin warm and dry to touch.  Skin intact .  Stretch marks noted to both sides.

## 2016-09-07 NOTE — BH Assessment (Signed)
[facility] Assessment Progress Note  Per Thedore MinsMojeed Akintayo, MD, this pt requires psychiatric hospitalization at this time.  Per Fatima SwazilandJordan, [facility] by Dr Jennet MaduroPucilowska.  Pt has signed Voluntary Admission and Consent for Treatment, as well as Consent to Release Information to her mother and her mother-in-law, as well as [facility], and signed forms have been faxed to [phone].  Pt's nurse, Park MeoCeleste, has been notified, and agrees to send original paperwork along with pt via [employer] [place], and to call report to [phone].  Please note that, per pt, her mother-in-law, Cyril MourningLinda Moyer, works for [employer].  Park MeoCeleste has been informed of this, and advised to request a different driver.  Doylene Canninghomas Cortni Tays, MA Triage Specialist [phone]

## 2016-09-07 NOTE — BH Assessment (Signed)
Patient has been accepted to [facility].  Patient assigned to room 322 by charge RN Jamesetta SoPhyllis.  Accepting and attending physician is Dr.Pucilowska.  Call report to [phone].   Tom informed of pt acceptance and is to fax support paperwork to [phone].  Pt registration unable to pre-admit at this time. TTS will attempt at later time.

## 2016-09-08 NOTE — [facility] Suicide Risk Assessment (Signed)
[facility] INPATIENT:  Family/Significant Other Suicide Prevention Education  Suicide Prevention Education:  Education Completed; Diane NixonJanice [name](mother [phone]), has been identified by the patient as the family member/significant other with whom the patient will be residing, and identified as the person(s) who will aid the patient in the event of a mental health crisis (suicidal ideations/suicide attempt).  With written consent from the patient, the family member/significant other has been provided the following suicide prevention education, prior to the and/or following the discharge of the patient.  The suicide prevention education provided includes the following:  Suicide risk factors  Suicide prevention and interventions  National Suicide Hotline telephone number  [facility] assessment telephone number  [facility] Emergency Assistance 911  [facility] and/or [facility]idential [facility] telephone number  Request made of family/significant other to:  Remove weapons (e.g., guns, rifles, knives), all items previously/currently identified as safety concern.    Remove drugs/medications (over-the-counter, prescriptions, illicit drugs), all items previously/currently identified as a safety concern.  The family member/significant other verbalizes understanding of the suicide prevention education information provided.  The family member/significant other agrees to remove the items of safety concern listed above.  Diane [name] G. Garnette CzechSampson MSW, LCSWA 09/08/2016, 5:56 PM

## 2016-09-08 NOTE — Progress Notes (Signed)
   09/08/16 1045  Clinical Encounter Type  Visited With Patient;Health care provider  Visit Type Initial;Behavioral Health;Spiritual support  Referral From Care management   [facility] present for treatment team meeting. Patient stated she has too much going on right now. She is 5 month pregnant and has a one year old at home too. CH will follow-up with patient to offer emotional and spiritual support.

## 2016-09-08 NOTE — Progress Notes (Signed)
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 of her grandmother who has Alzheimer's  Coping Skills:   Isolate, Avoidance, Exercise, Art/Dance, Talking, Sports, Music, Other (Comment) (Reading)  Personal Challenges: Communication, Expressing Yourself, Self-Esteem/Confidence, Social Interaction, Stress Management  Leisure Interests (2+):  Sports - Dance, Sports - 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  Awareness of Community Resources:  Yes  Community Resources:  Other (Comment) (Pools)  Current Use: Yes  If no, Barriers?:    Patient Strengths:  Multi-talented, caring  Patient Identified Areas of Improvement:  Managing stress, work on her attitude  Current Recreation Participation:  Playing with son  Patient Goal for Hospitalization:  To learn that she cannot control everything  [place]  [place]   Current [place] (including self-harm):  No  Current HI:  No  Consent to Intern Participation: N/A   Jacquelynn CreeGreene,Caylea Foronda M, LRT/CTRS 09/08/2016, 3:47 PM

## 2016-09-08 NOTE — Tx Team (Signed)
Interdisciplinary Treatment and Diagnostic Plan Update  09/08/2016 Time of Session:10:30am Diane [name] MRN: [id number]  Principal Diagnosis: Major depressive disorder, recurrent severe without psychotic features (HCC)  Secondary Diagnoses: Principal Problem:   Major depressive disorder, recurrent severe without psychotic features (HCC) Active Problems:   Pregnant   Current Medications:  Current Facility-Administered Medications  Medication Dose Route Frequency Provider Last Rate Last Dose  . acetaminophen (TYLENOL) tablet 650 mg  650 mg Oral Q6H PRN Pucilowska, Jolanta B, MD      . alum & mag hydroxide-simeth (MAALOX/MYLANTA) 200-200-20 MG/5ML suspension 30 mL  30 mL Oral Q4H PRN Pucilowska, Jolanta B, MD      . magnesium hydroxide (MILK OF MAGNESIA) suspension 30 mL  30 mL Oral Daily PRN Pucilowska, Jolanta B, MD      . prenatal multivitamin tablet 1 tablet  1 tablet Oral Q1200 Pucilowska, Jolanta B, MD   1 tablet at 09/08/16 1304  . sertraline (ZOLOFT) tablet 100 mg  100 mg Oral QHS Pucilowska, Jolanta B, MD   100 mg at 09/07/16 2156  . zolpidem (AMBIEN) tablet 5 mg  5 mg Oral QHS Pucilowska, Jolanta B, MD   5 mg at 09/07/16 2156   PTA Medications: Prescriptions Prior to Admission  Medication Sig Dispense Refill Last Dose  . acetaminophen (TYLENOL) 500 MG tablet Take 1,000 mg by mouth every 6 (six) hours as needed for mild pain, moderate pain, fever or headache.    Past Month at Unknown time  . ondansetron (ZOFRAN ODT) 8 MG disintegrating tablet Take 1 tablet (8 mg total) by mouth every 8 (eight) hours as needed for nausea or vomiting. 6 tablet 0 Past Month at Unknown time  . Prenatal Vit-Fe Fumarate-FA (PRENATAL MULTIVITAMIN) TABS tablet Take 1 tablet by mouth daily at 12 noon. (Patient taking differently: Take 1 tablet by mouth at bedtime. ) 1 tablet 10 09/06/2016 at Unknown time  . sertraline (ZOLOFT) 50 MG tablet Take 1 tablet (50 mg total) by mouth daily. (Patient taking  differently: Take 50 mg by mouth at bedtime. ) 30 tablet 3 09/06/2016 at Unknown time  . zolpidem (AMBIEN) 5 MG tablet Take 1 tablet (5 mg total) by mouth at bedtime as needed for sleep. 30 tablet 1 09/06/2016 at Unknown time    Patient Stressors: Financial difficulties Loss of significant other Occupational concerns  Patient Strengths: Ability for insight Active sense of humor Average or above average intelligence Capable of independent living Motivation for treatment/growth  Treatment Modalities: Medication Management, Group therapy, Case management,  1 to 1 session with clinician, Psychoeducation, Recreational therapy.   Physician Treatment Plan for Primary Diagnosis: Major depressive disorder, recurrent severe without psychotic features (HCC) Long Term Goal(s): Improvement in symptoms so as ready for discharge NA   Short Term Goals: Ability to identify changes in lifestyle to reduce recurrence of condition will improve Ability to verbalize feelings will improve Ability to disclose and discuss suicidal ideas Ability to demonstrate self-control will improve Ability to identify and develop effective coping behaviors will improve Ability to maintain clinical measurements within normal limits will improve Ability to identify triggers associated with substance abuse/mental health issues will improve NA  Medication Management: Evaluate patient's response, side effects, and tolerance of medication regimen.  Therapeutic Interventions: 1 to 1 sessions, Unit Group sessions and Medication administration.  Evaluation of Outcomes: Progressing  Physician Treatment Plan for Secondary Diagnosis: Principal Problem:   Major depressive disorder, recurrent severe without psychotic features (HCC) Active Problems:   Pregnant  Long  Term Goal(s): Improvement in symptoms so as ready for discharge NA   Short Term Goals: Ability to identify changes in lifestyle to reduce recurrence of condition will  improve Ability to verbalize feelings will improve Ability to disclose and discuss suicidal ideas Ability to demonstrate self-control will improve Ability to identify and develop effective coping behaviors will improve Ability to maintain clinical measurements within normal limits will improve Ability to identify triggers associated with substance abuse/mental health issues will improve NA     Medication Management: Evaluate patient's response, side effects, and tolerance of medication regimen.  Therapeutic Interventions: 1 to 1 sessions, Unit Group sessions and Medication administration.  Evaluation of Outcomes: Progressing   RN Treatment Plan for Primary Diagnosis: Major depressive disorder, recurrent severe without psychotic features (HCC) Long Term Goal(s): Knowledge of disease and therapeutic regimen to maintain health will improve  Short Term Goals: Ability to disclose and discuss suicidal ideas and Ability to identify and develop effective coping behaviors will improve  Medication Management: RN will administer medications as ordered by provider, will assess and evaluate patient's response and provide education to patient for prescribed medication. RN will report any adverse and/or side effects to prescribing provider.  Therapeutic Interventions: 1 on 1 counseling sessions, Psychoeducation, Medication administration, Evaluate responses to treatment, Monitor vital signs and CBGs as ordered, Perform/monitor CIWA, COWS, AIMS and Fall Risk screenings as ordered, Perform wound care treatments as ordered.  Evaluation of Outcomes: Progressing   LCSW Treatment Plan for Primary Diagnosis: Major depressive disorder, recurrent severe without psychotic features (HCC) Long Term Goal(s): Safe transition to appropriate next level of care at discharge, Engage patient in therapeutic group addressing interpersonal concerns.  Short Term Goals: Engage patient in aftercare planning with referrals  and resources, Increase social support and Increase skills for wellness and recovery  Therapeutic Interventions: Assess for all discharge needs, 1 to 1 time with Social worker, Explore available resources and support systems, Assess for adequacy in community support network, Educate family and significant other(s) on suicide prevention, Complete Psychosocial Assessment, Interpersonal group therapy.  Evaluation of Outcomes: Progressing   Progress in Treatment: Attending groups: No. Participating in groups: No. Taking medication as prescribed: Yes. Toleration medication: Yes. Family/Significant other contact made: Yes, individual(s) contacted:  mother Patient understands diagnosis: Yes. Discussing patient identified problems/goals with staff: Yes. Medical problems stabilized or resolved: Yes. Denies suicidal/homicidal ideation: Yes. Issues/concerns per patient self-inventory: No. Other: n/a  New problem(s) identified: None identified at this time  New Short Term/Long Term Goal(s): None identified at this time.   Discharge Plan or Barriers: Patient will discharge home and follow-up with outpatient services.   Reason for Continuation of Hospitalization: Depression Medication stabilization  Estimated Length of Stay: 3-5 days.   Attendees: Patient: Charise KillianJalisa Copland 09/08/2016 6:08 PM  Physician: Dr. Kristine LineaJolanta Pucilowska, MD 09/08/2016 6:08 PM  Nursing: Hulan AmatoGwen Farrish, RN 09/08/2016 6:08 PM  RN Care Manager: 09/08/2016 6:08 PM  Social Worker: Fredrich BirksAmaris G. Garnette CzechSampson MSW, LCSWA 09/08/2016 6:08 PM  Recreational Therapist: Jacquelynn CreeElizabeth M. Greene, LRT/CTRS 09/08/2016 6:08 PM  Other:  09/08/2016 6:08 PM  Other:  09/08/2016 6:08 PM  Other: 09/08/2016 6:08 PM    Scribe for Treatment Team: Arelia LongestAmaris G Nilda Keathley, LCSWA 09/08/2016 6:11 PM

## 2016-09-08 NOTE — Progress Notes (Signed)
[facility] MD Progress Note  09/08/2016 1:38 PM Diane [name]  MRN:  [id number]  Subjective:   09/08/2016. Diane [name] met with treatment team today. She feels depressed but a little more optimistic about the future. Her mother and mother-in-law visited last night. They are supportive. She slept better with Ambien. No complaints related to her pregnancy, fetal movement good. We will start twice daily fetal heart monitoring. She tolerates increased Zoloft well. We encourage group participation.   Per nursing: D: Pt denies SI/HI/AVH. Pt is pleasant and cooperative, affect is flat and sad. Patient is withdrawn, minimal interaction with peers and staff. Pt  appears less anxious no bizarre behavior noted, rated depression a 5/10 on a scale of 0-10.  A: Pt was offered support and encouragement. Pt was given scheduled medications. Pt was encouraged to attend groups. Q 15 minute checks were done for safety.  R:Pt attends groups and interacts well with peers and staff. Pt is complaint with medication. .Pt receptive to treatment and safety maintained on unit.  Principal Problem: Major depressive disorder, recurrent severe without psychotic features ([place]) Diagnosis:   Patient Active Problem List   Diagnosis Date Noted  . Major depressive disorder, recurrent severe without psychotic features ([place]) [F33.2] 09/07/2016  . Pregnant [Z34.90] 09/07/2016  . Back pain affecting pregnancy [O26.899, M54.9] 07/21/2016  . Encounter for supervision of normal pregnancy, antepartum [Z34.90] 04/14/2016  . Short interval between pregnancies affecting pregnancy, antepartum [O09.899] 04/14/2016  . Anxiety [F41.9] 03/29/2015  . Rh negative state in antepartum period [O09.899, Z67.91] 03/29/2015  . Allergy to multiple antibiotics--Rocephin, Cefzil [Z88.1] 03/29/2015  . Depression affecting pregnancy, antepartum--on Zoloft [O99.340, F32.9] 03/29/2015   Total Time spent with patient: 30 minutes  Past Psychiatric History:  depression.  Past Medical History:  Past Medical History:  Diagnosis Date  . Anemia   . Anxiety   . Headache   . UTI (lower urinary tract infection)    History reviewed. No pertinent surgical history. Family History:  Family History  Problem Relation Age of Onset  . Hypertension Mother   . Hypertension Maternal Grandmother   . Diabetes Maternal Grandmother   . Congestive Heart Failure Maternal Grandmother   . Hypertension Maternal Uncle   . Diabetes Maternal Uncle   . Hypertension Maternal Uncle   . Diabetes Maternal Uncle    Family Psychiatric  History: depression. Social History:  History  Alcohol Use No     History  Drug Use No    Social History   Social History  . Marital status: Single    Spouse name: N/A  . Number of children: N/A  . Years of education: N/A   Social History Main Topics  . Smoking status: Never Smoker  . Smokeless tobacco: Never Used  . Alcohol use No  . Drug use: No  . Sexual activity: Yes    Partners: Female    Birth control/ protection: None   Other Topics Concern  . None   Social History Narrative  . None   Additional Social History:                         Sleep: Fair  Appetite:  Fair  Current Medications: Current Facility-Administered Medications  Medication Dose Route Frequency Provider Last Rate Last Dose  . acetaminophen (TYLENOL) tablet 650 mg  650 mg Oral Q6H PRN Keyah Blizard B, MD      . alum & mag hydroxide-simeth (MAALOX/MYLANTA) 200-200-20 MG/5ML suspension 30 mL  30  mL Oral Q4H PRN Jazen Spraggins B, MD      . magnesium hydroxide (MILK OF MAGNESIA) suspension 30 mL  30 mL Oral Daily PRN Topaz Raglin B, MD      . prenatal multivitamin tablet 1 tablet  1 tablet Oral Q1200 Chavonne Sforza B, MD   1 tablet at 09/08/16 1304  . sertraline (ZOLOFT) tablet 100 mg  100 mg Oral QHS Devell Parkerson B, MD   100 mg at 09/07/16 2156  . zolpidem (AMBIEN) tablet 5 mg  5 mg Oral QHS  Louise Victory B, MD   5 mg at 09/07/16 2156    Lab Results:  Results for orders placed or performed during the hospital encounter of 09/07/16 (from the past 48 hour(s))  Comprehensive metabolic panel     Status: Abnormal   Collection Time: 09/07/16  1:41 AM  Result Value Ref Range   Sodium 136 135 - 145 mmol/L   Potassium 3.3 (L) 3.5 - 5.1 mmol/L   Chloride 103 101 - 111 mmol/L   CO2 22 22 - 32 mmol/L   Glucose, Bld 85 65 - 99 mg/dL   BUN 6 6 - 20 mg/dL   Creatinine, Ser 0.53 0.44 - 1.00 mg/dL   Calcium 8.9 8.9 - 10.3 mg/dL   Total Protein 7.3 6.5 - 8.1 g/dL   Albumin 3.2 (L) 3.5 - 5.0 g/dL   AST 21 15 - 41 U/L   ALT 10 (L) 14 - 54 U/L   Alkaline Phosphatase 78 38 - 126 U/L   Total Bilirubin 1.2 0.3 - 1.2 mg/dL   GFR calc non Af Amer >60 >60 mL/min   GFR calc Af Amer >60 >60 mL/min    Comment: (NOTE) The eGFR has been calculated using the CKD EPI equation. This calculation has not been validated in all clinical situations. eGFR's persistently <60 mL/min signify possible Chronic Kidney Disease.    Anion gap 11 5 - 15  Ethanol     Status: None   Collection Time: 09/07/16  1:41 AM  Result Value Ref Range   Alcohol, Ethyl (B) <5 <5 mg/dL    Comment:        LOWEST DETECTABLE LIMIT FOR SERUM ALCOHOL IS 5 mg/dL FOR MEDICAL PURPOSES ONLY   Salicylate level     Status: None   Collection Time: 09/07/16  1:41 AM  Result Value Ref Range   Salicylate Lvl <1.2 2.8 - 30.0 mg/dL  Acetaminophen level     Status: Abnormal   Collection Time: 09/07/16  1:41 AM  Result Value Ref Range   Acetaminophen (Tylenol), Serum <10 (L) 10 - 30 ug/mL    Comment:        THERAPEUTIC CONCENTRATIONS VARY SIGNIFICANTLY. A RANGE OF 10-30 ug/mL MAY BE AN EFFECTIVE CONCENTRATION FOR MANY PATIENTS. HOWEVER, SOME ARE BEST TREATED AT CONCENTRATIONS OUTSIDE THIS RANGE. ACETAMINOPHEN CONCENTRATIONS >150 ug/mL AT 4 HOURS AFTER INGESTION AND >50 ug/mL AT 12 HOURS AFTER INGESTION ARE OFTEN  ASSOCIATED WITH TOXIC REACTIONS.   cbc     Status: Abnormal   Collection Time: 09/07/16  1:41 AM  Result Value Ref Range   WBC 7.5 4.0 - 10.5 K/uL   RBC 3.63 (L) 3.87 - 5.11 MIL/uL   Hemoglobin 11.5 (L) 12.0 - 15.0 g/dL   HCT 32.8 (L) 36.0 - 46.0 %   MCV 90.4 78.0 - 100.0 fL   MCH 31.7 26.0 - 34.0 pg   MCHC 35.1 30.0 - 36.0 g/dL   RDW 12.2 11.5 -  15.5 %   Platelets 170 150 - 400 K/uL  Rapid urine drug screen (hospital performed)     Status: None   Collection Time: 09/07/16  2:24 AM  Result Value Ref Range   Opiates NONE DETECTED NONE DETECTED   Cocaine NONE DETECTED NONE DETECTED   Benzodiazepines NONE DETECTED NONE DETECTED   Amphetamines NONE DETECTED NONE DETECTED   Tetrahydrocannabinol NONE DETECTED NONE DETECTED   Barbiturates NONE DETECTED NONE DETECTED    Comment:        DRUG SCREEN FOR MEDICAL PURPOSES ONLY.  IF CONFIRMATION IS NEEDED FOR ANY PURPOSE, NOTIFY LAB WITHIN 5 DAYS.        LOWEST DETECTABLE LIMITS FOR URINE DRUG SCREEN Drug Class       Cutoff (ng/mL) Amphetamine      1000 Barbiturate      200 Benzodiazepine   222 Tricyclics       979 Opiates          300 Cocaine          300 THC              50     Blood Alcohol level:  Lab Results  Component Value Date   ETH <5 89/21/1943    Metabolic Disorder Labs: No results found for: HGBA1C, MPG No results found for: PROLACTIN No results found for: CHOL, TRIG, HDL, CHOLHDL, VLDL, LDLCALC  Physical Findings: AIMS: Facial and Oral Movements Muscles of Facial Expression: None, normal Lips and Perioral Area: None, normal Jaw: None, normal Tongue: None, normal,Extremity Movements Upper (arms, wrists, hands, fingers): None, normal Lower (legs, knees, ankles, toes): None, normal, Trunk Movements Neck, shoulders, hips: None, normal, Overall Severity Severity of abnormal movements (highest score from questions above): None, normal Incapacitation due to abnormal movements: None, normal Patient's awareness  of abnormal movements (rate only patient's report): No Awareness, Dental Status Current problems with teeth and/or dentures?: No Does patient usually wear dentures?: No  CIWA:    COWS:     Musculoskeletal: Strength & Muscle Tone: within normal limits Gait & Station: normal Patient leans: N/A  Psychiatric Specialty Exam: Physical Exam  Nursing note and vitals reviewed. Psychiatric: Her speech is normal. Judgment normal. Her affect is blunt. She is slowed and withdrawn. Cognition and memory are normal. She exhibits a depressed mood. She expresses suicidal ideation. She expresses suicidal plans.    Review of Systems  Psychiatric/Behavioral: Positive for depression and suicidal ideas. The patient has insomnia.   All other systems reviewed and are negative.   Blood pressure 105/64, pulse 91, temperature 98.3 F (36.8 C), temperature source Oral, resp. rate 19, height 5' 9"  (1.753 m), weight 78.9 kg (174 lb), last menstrual period 01/24/2016, SpO2 100 %, not currently breastfeeding.Body mass index is 25.7 kg/m.  General Appearance: Casual  Eye Contact:  Good  Speech:  Clear and Coherent  Volume:  Decreased  Mood:  Depressed  Affect:  Blunt  Thought Process:  Goal Directed and Descriptions of Associations: Intact  Orientation:  Full (Time, Place, and Person)  Thought Content:  WDL  Suicidal Thoughts:  Yes.  with intent/plan  Homicidal Thoughts:  No  Memory:  Immediate;   Fair Recent;   Fair Remote;   Fair  Judgement:  Fair  Insight:  Fair  Psychomotor Activity:  Psychomotor Retardation  Concentration:  Concentration: Fair and Attention Span: Fair  Recall:  [employer] of Knowledge:  Fair  Language:  Fair  Akathisia:  No  Handed:  Right  AIMS (if indicated):     Assets:  Communication Skills Desire for Improvement Financial Resources/Insurance Housing Intimacy Physical Health Resilience Social Support  ADL's:  Intact  Cognition:  WNL  Sleep:  Number of Hours: 6.5      Treatment Plan Summary: Daily contact with patient to assess and evaluate symptoms and progress in treatment and Medication management  Orson Slick, MD 09/08/2016, 1:38 PM

## 2016-09-08 NOTE — Progress Notes (Signed)
Patient ID: Diane KillianJalisa [name], female   DOB: [birth year], 21 y.o.   MRN: [id number]  CSW contacted patient's baby father Marilynn Latinougene Davis to schedule family session with him and patient before she discharges. No answer, CSW left voicemail asking for a returned call.  Jewelz Kobus G. Garnette CzechSampson MSW, [facility] 09/08/2016 6:02 PM

## 2016-09-08 NOTE — Progress Notes (Signed)
Recreation Therapy Notes  Date: 07.11.18 Time: 1:00 pm Location: Craft Room  Group Topic: Self-esteem  Goal Area(s) Addresses:  Patient will write at least one positive trait about self. Patient will verbalize benefit of having a healthy self-esteem.  Behavioral Response: Attentive, Interactive  Intervention: I Am  Activity: Patients were given a worksheet with the letter I on it and were instructed to write as many positive traits about themselves inside the letter.  Education: LRT educated patients on ways to increase their self-esteem.  Education Outcome: Acknowledges education/In group clarification offered   Clinical Observations/Feedback: Patient wrote positive traits about self. Patient contributed to group discussion by stating it was easy at first then difficult to think of positive traits, and how her self-esteem affects her.  Jacquelynn CreeGreene,Yoskar Murrillo M, LRT/CTRS 09/08/2016 1:55 PM

## 2016-09-08 NOTE — BHH Counselor (Signed)
Adult Comprehensive Assessment  Patient ID: Diane [name], female   DOB: [birth year], 21 y.o.   MRN: [id number]  Information Source: Information source: Patient  Current Stressors:  Educational / Learning stressors: Pt was in college but dropped out in Dec. 2014.  Employment / Job issues: Pt is unemployed Family Relationships: n/a Museum/gallery curator / Lack of resources (include bankruptcy): Pt states she has been stressful due to her not working right now. Housing / Lack of housing: n/a Physical health (include injuries & life threatening diseases): n/a Social relationships: Pt recently broke up with her boyfriend at the end of May but they are still living together to raise their children.  Substance abuse: Patient denies Bereavement / Loss: n/a  Living/Environment/Situation:  Living Arrangements: Children, Spouse/significant other How long has patient lived in current situation?: 3 1/2 years What is atmosphere in current home: Comfortable, Other (Comment) (Pt states her living situation is complicated because her baby father still lives with her. )  Family History:  Marital status: Single Are you sexually active?: Yes What is your sexual orientation?: heterosexual Has your sexual activity been affected by drugs, alcohol, medication, or emotional stress?: n/a Does patient have children?: Yes How many children?: 2 How is patient's relationship with their children?: Pt has 1 son and is currently 7 months pregnant with another boy.   Childhood History:  By whom was/is the patient raised?: Mother, Grandparents Additional childhood history information: Pt has no relationship with biological father until recently. Description of patient's relationship with caregiver when they were a child: Pt states when she was younger she got a lot of her mom.  Patient's description of current relationship with people who raised him/her: Pt states her and her mother get along better now that the issues have been  resolved with her biological father. Pt is developing a relationship with her father and she just met in her 28s.  How were you disciplined when you got in trouble as a child/adolescent?: Pt states her family was strict but it was appropriate discipline.  Does patient have siblings?: Yes Number of Siblings: 5 Description of patient's current relationship with siblings: all half siblings 3 sisters and 2 brothers.  Did patient suffer any verbal/emotional/physical/sexual abuse as a child?: No Did patient suffer from severe childhood neglect?: No Has patient ever been sexually abused/assaulted/raped as an adolescent or adult?: No Was the patient ever a victim of a crime or a disaster?: No Witnessed domestic violence?: No Has patient been effected by domestic violence as an adult?: No  Education:  Highest grade of school patient has completed: some college Currently a student?: No (However patient has been trying to return back to school but has been delayed due to her pregnancy. ) Learning disability?: No  Employment/Work Situation:   Employment situation: Unemployed Patient's job has been impacted by current illness: No What is the longest time patient has a held a job?: 7-8 months Where was the patient employed at that time?: [employer] Has patient ever been in the [employer]?: No Has patient ever served in combat?: No Did You Receive Any Psychiatric Treatment/Services While in Passenger transport manager?: No Are There Guns or Other Weapons in [place]?: Yes Types of Guns/Weapons: Pt states her baby father does own a gun but it keeps it in his car to keep away from son.  Are These Weapons Safely Secured?: Yes (n/a)  Financial Resources:   Financial resources: Income from spouse, Medicaid, Food stamps (Pt is primarily financially supported by her baby father.)  Does patient have a representative payee or guardian?: No  Alcohol/Substance Abuse:   What has been your use of drugs/alcohol within the  last 12 months?: Patient denies If attempted suicide, did drugs/alcohol play a role in this?: No Alcohol/Substance Abuse Treatment Hx: Denies past history Has alcohol/substance abuse ever caused legal problems?: No  Social Support System:   Patient's Community Support System: Good Describe Community Support System: Pt states she has support from her baby father, her family, his family, and several close friends.  Type of faith/religion: Pt states she is not a spiritual person How does patient's faith help to cope with current illness?: n/a  Leisure/Recreation:   Leisure and Hobbies: Reading, swimming, arts&crafts, photography  Strengths/Needs:   What things does the patient do well?: being a mother, motivated for treatment and returning to school.  In what areas does patient struggle / problems for patient: depression and stress management  Discharge Plan:   Does patient have access to transportation?: Yes Will patient be returning to same living situation after discharge?: Yes Currently receiving community mental health services: No If no, would patient like referral for services when discharged?: Yes (What county?) [facility]) Does patient have financial barriers related to discharge medications?: No  Summary/Recommendations:   Patient is a 21 year old pregnant female admitted voluntarily with a diagnosis of Major depressive disorder, recurrent severe without psychotic features. Information was obtained from psychosocial assessment completed with patient and chart review conducted by this evaluator. Patient presented to the hospital complaining of thoughts of SI. Patient reports primary triggers for admission were recent financial stress, relationship issues with baby father, and worsening depression. Patient states she has a strong support system from her family and close friends. Patient will benefit from crisis stabilization, medication evaluation, group therapy and psycho  education in addition to case management for discharge. At discharge, it is recommended that patient remain compliant with established discharge plan and continued treatment.    Diane [name], [facility] 09/08/2016 10:17 AM

## 2016-09-08 NOTE — Progress Notes (Signed)
  D: Pt denies SI/HI/AVH. Pt is pleasant and cooperative, affect is flat and sad. Patient is withdrawn, minimal interaction with peers and staff. Pt  appears less anxious no bizarre behavior noted, rated depression a 5/10 on a scale of 0-10.  A: Pt was offered support and encouragement. Pt was given scheduled medications. Pt was encouraged to attend groups. Q 15 minute checks were done for safety.  R:Pt attends groups and interacts well with peers and staff. Pt is complaint with medication. .Pt receptive to treatment and safety maintained on unit.

## 2016-09-08 NOTE — Plan of Care (Signed)
Problem: Coping: Goal: Ability to cope will improve Outcome: Progressing "I'm feeling a lot better than when I first came in."

## 2016-09-08 NOTE — Progress Notes (Signed)
Pt visible on unit, interacting appropriately and pleasantly with staff and peers.  Guarded, but brightens on approach.  Reports "I feel a lot better than when I first came in."  Observed working on puzzles and crosswords in day room.  No behavioral issues.

## 2016-09-08 NOTE — Plan of Care (Signed)
Problem: Coping: Goal: Ability to verbalize feelings will improve Outcome: Progressing Patient verbalized feelings to staff.    

## 2016-09-08 NOTE — BHH Group Notes (Signed)
[facility] LCSW Group Therapy   09/08/2016  9:30 am   Type of Therapy: Group Therapy   Participation Level: Patient invited but did not attend.    Hampton AbbotKadijah Talena Neira, MSW, LCSW-A 09/08/2016, 10:25AM

## 2016-09-09 MED ORDER — SERTRALINE HCL 100 MG PO TABS: 100.0000 mg | ORAL_TABLET | Freq: Every day | ORAL | 1 refills | Status: DC

## 2016-09-09 NOTE — Plan of Care (Signed)
Problem: Coping: Goal: Ability to verbalize feelings will improve Outcome: Progressing Patient verbalized feelings to staff.    

## 2016-09-09 NOTE — Progress Notes (Signed)
This encounter was created in error - please disregard.

## 2016-09-09 NOTE — Progress Notes (Signed)
  [facility] :  Will you be returning to the same living situation after discharge:  Yes,  returning home. At discharge, do you have transportation home?: Yes,  family will pick pt up. Do you have the ability to pay for your medications: Yes,  Medicaid.  Release of information consent forms completed and in the chart;  Patient's signature needed at discharge.  Patient to Follow up at: Follow-up Information    [facility] Follow up on 09/13/2016.   Why:  Follow-up appointment on this date with you OB/GYN for medication management and prenatal check-up.  Contact information: [address] [place] [phone]       [facility]. Go on 09/13/2016.   Specialty:  Behavioral Health Why:  Please arrive to [facility] on Monday, July 16 at 10AM for your appointment with Viviann SpareSteven [facility]. If you have questions or need to cancel appt, contact office directly. Bring discharge paperwork to this appt. Contact information: [address] [place] [id number] [phone]           Next level of care provider has access to [facility] Link:no  Safety Planning and Suicide Prevention discussed: Yes,  SPE completed with pt's mother  Have you used any form of tobacco in the last 30 days? (Cigarettes, Smokeless Tobacco, Cigars, and/or Pipes): No  Has patient been referred to the Quitline?: N/A patient is not a smoker  Patient has been referred for addiction treatment: N/A  Lynden OxfordKadijah R Juelz [name], MSW, LCSW-A 09/09/2016, 3:42 PM

## 2016-09-09 NOTE — Progress Notes (Signed)
Bright affect.  Pleasant and cooperative.  Denies SI/HI/AVH.  Verbalizes that she feels much better and states that just being away from it all for a little while helped her to gain perspective. Discharge instructions given, verbalized understanding.  Prescriptions given and all personal belongings returned.  Escorted off unit by this Clinical research associatewriter to meet boyfriend to travel home.

## 2016-09-09 NOTE — Progress Notes (Signed)
Recreation Therapy Notes  Date: 07.12.18 Time: 1:00 pm Location: Craft Room  Group Topic: Coping Skills  Goal Area(s) Addresses:  Patient will verbalize importance of recognizing emotions. Patient will identify at least one emotion. Patient will identify at least one coping skill.  Behavioral Response: Attentive, Interactive  Intervention: Emotion Wheel  Activity: Patients were given a worksheet that had a circle divided into 8 sections. Patients were instructed as a group to think of 8 emotions towards their recovery and write the emotions outside of the circle. Inside the circle, patients were instructed to write healthy coping skills to help them cope with the emotion.  Education: LRT educated patients on coping skills  Education Outcome: Acknowledges education/In group clarification offered   Clinical Observations/Feedback: Patient identified emotions felt during the recovery process. Patient wrote healthy coping skills for each emotion. Patient contributed to group discussion by stating why it is important to recognize her emotions.  Jacquelynn CreeGreene,Denisha Hoel M, LRT/CTRS 09/09/2016 1:51 PM

## 2016-09-09 NOTE — Discharge Summary (Signed)
Physician Discharge Summary Note  Patient:  Diane [name] is an 21 y.o., female MRN:  [id number] DOB:  [birth year] Patient phone:  [phone] (home)  Patient address:   [address] [place] [address],  Total Time spent with patient: 30 minutes  Date of Admission:  09/07/2016 Date of Discharge: 09/09/2016  Reason for Admission:  Suicidal ideation.  Identifying data. Diane [name] is a 21 year old female with history of depression.  Chief complaint. "The way I felt I needed to come to the hospital."  History of present illness. Information was obtained from the patient and the chart. The patient came to the emergency room complaining of suicidal ideation that were clearly echo dystonic. She did not have a plan but had thoughts of not waking up or dying in a car accident. She felt unsafe. Insulin-dependent that she had her first appointment scheduled by her obstetrician with a therapist today but she decided that it was safer to come to the hospital. The patient reports that for the past 2 years she has been depressed. Her depression started with her first pregnancy. She has been maintained on Zoloft initially 25 and then 50 mg daily with excellent results. Recently she has been under considerable stress being pregnant, experiencing financial difficulties, conflict with the boyfriend and a difficult decision whether or not to forfeit her academic carrier and look for a regular job to support her family. She reports many symptoms of depression with poor sleep, decreased appetite, anhedonia, feeling of guilt and hopelessness worthlessness, poor energy and concentration, social isolation, and crying spells. Suicidal thinking is new to her. She denies any thoughts of hurting her baby. She denies psychotic symptoms or symptoms suggestive of bipolar mania. She has a history of panic attacks but not recently. There is no social anxiety, PTSD, OCD symptoms. She does not use alcohol or drugs.  Past  psychiatric history. She reports first feeling depressed at the age of 21 but has not been treated until later. She did not have traumatic experiences growing up but did not know her father until she was grown. She has never been hospitalized, attempted suicide or been treated except for the 2 year period recently.  Family psychiatric history. Nonreported.  Social history. She graduated from high school and did show some time in photography. She was asked to work on her senior year book. She signed up for photography classes that started at the beginning of July but due to turmoil in her life will have to delay it after the baby is born. She is a stay-at-home mom. But will look for jobs that she can go from home. She receives food stamps. She lives with the father of both of her children. Unfortunately he pays child support to another family and they experienced tremendous financial difficulties.   Principal Problem: Major depressive disorder, recurrent severe without psychotic features [facility]) Discharge Diagnoses: Patient Active Problem List   Diagnosis Date Noted  . 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 Pregnant [Z34.90] 09/07/2016  . Back pain affecting pregnancy [O26.899, M54.9] 07/21/2016  . Encounter for supervision of normal pregnancy, antepartum [Z34.90] 04/14/2016  . Short interval between pregnancies affecting pregnancy, antepartum [O09.899] 04/14/2016  . Anxiety [F41.9] 03/29/2015  . Rh negative state in antepartum period [O09.899, Z67.91] 03/29/2015  . Allergy to multiple antibiotics--Rocephin, Cefzil [Z88.1] 03/29/2015  . Depression affecting pregnancy, antepartum--on Zoloft [O99.340, F32.9] 03/29/2015    Past Medical History:  Past Medical History:  Diagnosis  Date  . Anemia   . Anxiety   . Headache   . UTI (lower urinary tract infection)    History reviewed. No pertinent surgical history. Family History:  Family  History  Problem Relation Age of Onset  . Hypertension Mother   . Hypertension Maternal Grandmother   . Diabetes Maternal Grandmother   . Congestive Heart Failure Maternal Grandmother   . Hypertension Maternal Uncle   . Diabetes Maternal Uncle   . Hypertension Maternal Uncle   . Diabetes Maternal Uncle    Social History:  History  Alcohol Use No     History  Drug Use No    Social History   Social History  . Marital status: Single    Spouse name: N/A  . Number of children: N/A  . Years of education: N/A   Social History Main Topics  . Smoking status: Never Smoker  . Smokeless tobacco: Never Used  . Alcohol use No  . Drug use: No  . Sexual activity: Yes    Partners: Female    Birth control/ protection: None   Other Topics Concern  . None   Social History Narrative  . None    Hospital Course:    Ms. Phillips is a 21 year old pregnant female with a history of depression admitted for suicidal ideation.  1. Suicidal ideation. Resolved. The patient is able to contract for safety. She is forward thinking and optimistic about the future. She is a loving mother.  2. Depression. We increased Zoloft to 100 mg nightly.  3. Insomnia. We continued Ambien 5 mg.  4. Pregnancy. She is in the [facility] in [place] last visit was on June 25. We continued prenatal vitamins and fetal heart rate monitoring.  5. Disposition. The patient was discharged to home with her boyfriend after a family meeting. She will follow up with her obstetrician and a therapist.    Physical Findings: AIMS: Facial and Oral Movements Muscles of Facial Expression: None, normal Lips and Perioral Area: None, normal Jaw: None, normal Tongue: None, normal,Extremity Movements Upper (arms, wrists, hands, fingers): None, normal Lower (legs, knees, ankles, toes): None, normal, Trunk Movements Neck, shoulders, hips: None, normal, Overall Severity Severity of abnormal movements (highest score from  questions above): None, normal Incapacitation due to abnormal movements: None, normal Patient's awareness of abnormal movements (rate only patient's report): No Awareness, Dental Status Current problems with teeth and/or dentures?: No Does patient usually wear dentures?: No  CIWA:    COWS:     Musculoskeletal: Strength & Muscle Tone: within normal limits Gait & Station: normal Patient leans: N/A  Psychiatric Specialty Exam: Physical Exam  Nursing note and vitals reviewed. Psychiatric: She has a normal mood and affect. Her speech is normal and behavior is normal. Judgment and thought content normal. Cognition and memory are normal.    Review of Systems  All other systems reviewed and are negative.   Blood pressure 101/63, pulse (!) 115, temperature 98.7 F (37.1 C), temperature source Oral, resp. rate 18, height 5\' 9"  (1.753 m), weight 78.9 kg (174 lb), last menstrual period 01/24/2016, SpO2 100 %, not currently breastfeeding.Body mass index is 25.7 kg/m.  General Appearance: Casual  Eye Contact:  Good  Speech:  Clear and Coherent  Volume:  Normal  Mood:  Euthymic  Affect:  Appropriate  Thought Process:  Goal Directed and Descriptions of Associations: Intact  Orientation:  Full (Time, Place, and Person)  Thought Content:  WDL  Suicidal Thoughts:  No  Homicidal Thoughts:  No  Memory:  Immediate;   Fair Recent;   Fair Remote;   Fair  Judgement:  Fair  Insight:  Fair  Psychomotor Activity:  Normal  Concentration:  Concentration: Fair and Attention Span: Fair  Recall:  Fair  Fund of Knowledge:  Fair  Language:  Fair  Akathisia:  No  Handed:  Right  AIMS (if indicated):     Assets:  Communication Skills [employer] for Improvement Financial Resources/Insurance Housing Intimacy Physical Health Resilience Social Support  ADL's:  Intact  Cognition:  WNL  Sleep:  Number of Hours: 7     Have you used any form of tobacco in the last 30 days? (Cigarettes, Smokeless  Tobacco, Cigars, and/or Pipes): No  Has this patient used any form of tobacco in the last 30 days? (Cigarettes, Smokeless Tobacco, Cigars, and/or Pipes) Yes, No  Blood Alcohol level:  Lab Results  Component Value Date   ETH <5 09/07/2016    Metabolic Disorder Labs:  No results found for: HGBA1C, MPG No results found for: PROLACTIN No results found for: CHOL, TRIG, HDL, CHOLHDL, VLDL, LDLCALC  See Psychiatric Specialty Exam and Suicide Risk Assessment completed by Attending Physician prior to discharge.  Discharge destination:  Home  Is patient on multiple antipsychotic therapies at discharge:  No   Has Patient had three or more failed trials of antipsychotic monotherapy by history:  No  Recommended Plan for Multiple Antipsychotic Therapies: NA  Discharge Instructions    Diet - low sodium heart healthy    Complete by:  As directed    Increase activity slowly    Complete by:  As directed      Allergies as of 09/09/2016      Reactions   Cefzil [cefprozil] Hives   Rocephin [ceftriaxone Sodium In Dextrose] Hives      Medication List    TAKE these medications     Indication  acetaminophen 500 MG tablet Commonly known as:  TYLENOL Take 1,000 mg by mouth every 6 (six) hours as needed for mild pain, moderate pain, fever or headache.  Indication:  Fever   ondansetron 8 MG disintegrating tablet Commonly known as:  ZOFRAN ODT Take 1 tablet (8 mg total) by mouth every 8 (eight) hours as needed for nausea or vomiting.  Indication:  nausea   prenatal multivitamin Tabs tablet Take 1 tablet by mouth daily at 12 noon. What changed:  when to take this  Indication:  Pregnancy   sertraline 100 MG tablet Commonly known as:  ZOLOFT Take 1 tablet (100 mg total) by mouth at bedtime. What changed:  medication strength  how much to take  when to take this  Indication:  Major Depressive Disorder   zolpidem 5 MG tablet Commonly known as:  AMBIEN Take 1 tablet (5 mg total) by  mouth at bedtime as needed for sleep.  Indication:  Trouble Sleeping      Follow-up Information    [facility] up on 09/13/2016.   Why:  Follow-up appointment on this date with you OB/GYN for medication management and prenatal check-up.  Contact information: [address] [place] [phone]          Follow-up recommendations:  Activity:  as tolerated. Diet:  regular. Other:  keep follow up appointment.  Comments:    Signed: Kristine LineaJolanta Jaidy Cottam, MD 09/09/2016, 1:08 PM

## 2016-09-09 NOTE — Progress Notes (Signed)
D: Pt denies SI/HI/AVH. Pt is pleasant and cooperative, affect is flat but brightens upon approach. Pt appears less anxious and she is interacting with peers and staff appropriately.  A: Pt was offered support and encouragement. Pt was given scheduled medications. Pt was encouraged to attend groups. Q 15 minute checks were done for safety.  R:Pt attends groups and interacts well with peers and staff. Pt is taking medication. Pt has no complaints.Pt receptive to treatment and safety maintained on unit.   

## 2016-09-09 NOTE — Plan of Care (Signed)
Problem: [facility] Participation in Recreation Therapeutic Interventions Goal: STG-Patient will demonstrate improved self esteem by identif STG: Self-Esteem - Within 3 treatment sessions, patient will verbalize at least 5 positive affirmation statements in one treatment session to increase self-esteem.  Outcome: Completed/Met Date Met: 09/09/16 Treatment Session 1; Completed 1 out of 1: At approximately 11:10 am, LRT met with patient in craft room. Patient verbalized 5 positive affirmation statements. Patient reported it felt "good". LRT encouraged patient to continue saying positive affirmation statements.   Leonette Monarch, LRT/CTRS  07.12.18 2:39 pm Goal: STG-Other Recreation Therapy Goal (Specify) STG: Stress Management - Within 3 treatment sessions, patient will verbalize understanding of the stress management techniques in one treatment session to increase stress management skills.  Outcome: Completed/Met Date Met: 09/09/16 Treatment Session 1; Completed 1 out of 1: At approximately 11:10 am, LRT met with patient in craft room. LRT educated and provided patient with handouts on stress management techniques. Patient verbalized understanding. LRT encouraged patient to read over and practice the stress management techniques.  Leonette Monarch, LRT/CTRS 07.12.18 2:41 pm

## 2016-09-09 NOTE — [facility] Suicide Risk Assessment (Signed)
[facility]   Principal Problem: Major depressive disorder, recurrent severe without psychotic features [facility](HCC) Discharge Diagnoses:  Patient Active Problem List   Diagnosis Date Noted  . 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 Pregnant [Z34.90] 09/07/2016  . Back pain affecting pregnancy [O26.899, M54.9] 07/21/2016  . Encounter for supervision of normal pregnancy, antepartum [Z34.90] 04/14/2016  . Short interval between pregnancies affecting pregnancy, antepartum [O09.899] 04/14/2016  . Anxiety [F41.9] 03/29/2015  . Rh negative state in antepartum period [O09.899, Z67.91] 03/29/2015  . Allergy to multiple antibiotics--Rocephin, Cefzil [Z88.1] 03/29/2015  . Depression affecting pregnancy, antepartum--on Zoloft [O99.340, F32.9] 03/29/2015    Total Time spent with patient: 30 minutes  Musculoskeletal: Strength & Muscle Tone: within normal limits Gait & Station: normal Patient leans: Backward  Psychiatric Specialty Exam: Review of Systems  All other systems reviewed and are negative.   Blood pressure 101/63, pulse (!) 115, temperature 98.7 F (37.1 C), temperature source Oral, resp. rate 18, height 5\' 9"  (1.753 m), weight 78.9 kg (174 lb), last menstrual period 01/24/2016, SpO2 100 %, not currently breastfeeding.Body mass index is 25.7 kg/m.  General Appearance: Casual  Eye Contact::  Good  Speech:  Clear and Coherent409  Volume:  Normal  Mood:  Euthymic  Affect:  Appropriate  Thought Process:  Goal Directed and Descriptions of Associations: Intact  Orientation:  Full (Time, Place, and Person)  Thought Content:  WDL  Suicidal Thoughts:  No  Homicidal Thoughts:  No  Memory:  Immediate;   Fair Recent;   Fair Remote;   Fair  Judgement:  Fair  Insight:  Fair  Psychomotor Activity:  Normal  Concentration:  Fair  Recall:  FiservFair  Fund of Knowledge:Fair  Language: Fair  Akathisia:  No  Handed:   Right  AIMS (if indicated):     Assets:  Communication Skills Desire for Improvement Financial Resources/Insurance Housing Intimacy Physical Health Resilience Social Support  Sleep:  Number of Hours: 7  Cognition: WNL  ADL's:  Intact   Mental Status Per Nursing Assessment::   On Admission:  NA  Demographic Factors:  Unemployed  Loss Factors: Financial problems/change in socioeconomic status  Historical Factors: Family history of mental illness or substance abuse  Risk Reduction Factors:   Pregnancy, Responsible for children under 23 years of age, Sense of responsibility to family, Living with another person, especially a relative, Positive social support and Positive therapeutic relationship  Continued Clinical Symptoms:  Depression:   Insomnia Previous Psychiatric Diagnoses and Treatments  Cognitive Features That Contribute To Risk:  None    Suicide Risk:  Minimal: No identifiable suicidal ideation.  Patients presenting with no risk factors but with morbid ruminations; may be classified as minimal risk based on the severity of the depressive symptoms  Follow-up Information    [facility] up on 09/13/2016.   Why:  Follow-up appointment on this date with you OB/GYN for medication management and prenatal check-up.  Contact information: [address] [place] [phone]          Plan Of Care/Follow-up recommendations:  Activity:  as tolerated. Diet:  regular. Other:  keep follow up appointments.  Kristine LineaJolanta Xzavier Swinger, MD 09/09/2016, 1:04 PM

## 2016-09-09 NOTE — [facility] Group Notes (Signed)
Goals Group  Date/Time: 09/09/2016, 9:00 AM Type of Therapy and Topic: Group Therapy: Goals Group: SMART Goals  ?  Participation Level: Moderate  ?  Description of Group:  ?  The purpose of a daily goals group is to assist and guide patients in setting recovery/wellness-related goals. The objective is to set goals as they relate to the crisis in which they were admitted. Patients will be using SMART goal modalities to set measurable goals. Characteristics of realistic goals will be discussed and patients will be assisted in setting and processing how one will reach their goal. Facilitator will also assist patients in applying interventions and coping skills learned in psycho-education groups to the SMART goal and process how one will achieve defined goal.  ?  Therapeutic Goals:  ?  -Patients will develop and document one goal related to or their crisis in which brought them into treatment.  -Patients will be guided by LCSW using SMART goal setting modality in how to set a measurable, attainable, realistic and time sensitive goal.  -Patients will process barriers in reaching goal.  -Patients will process interventions in how to overcome and successful in reaching goal.  ?  Patient's Goal: Pt stated goal is to "continue working on stress management skills."  ?  Therapeutic Modalities:  Motivational Interviewing  Cognitive Behavioral Therapy  Crisis Intervention Model  SMART goals setting   Hampton AbbotKadijah Merwin Breden, MSW, LCSW-A 09/09/2016, 11:21AM

## 2016-09-10 NOTE — Progress Notes (Signed)
Recreation Therapy Notes  INPATIENT RECREATION TR PLAN  Patient Details Name: Diane [name] MRN: [id number] DOB: [birth year] Today's Date: 09/10/2016  Rec Therapy Plan Is patient appropriate for Therapeutic Recreation?: Yes Treatment times per week: At least once a week TR Treatment/Interventions: 1:1 session, Group participation (Comment) (Appropriate participation in daily recreational therapy tx)  Discharge Criteria Pt will be discharged from therapy if:: Treatment goals are met, Discharged Treatment plan/goals/alternatives discussed and agreed upon by:: Patient/family  Discharge Summary Short term goals set: See Care Plan Short term goals met: Complete Progress toward goals comments: One-to-one attended Which groups?: Self-esteem, Coping skills One-to-one attended: Self-esteem, stress management Reason goals not met: N/A Therapeutic equipment acquired: None Reason patient discharged from therapy: Discharge from hospital Pt/family agrees with progress & goals achieved: Yes Date patient discharged from therapy: 09/09/16   Leonette Monarch, LRT/CTRS 09/10/2016, 2:19 PM

## 2016-09-13 ENCOUNTER — Ambulatory Visit (INDEPENDENT_AMBULATORY_CARE_PROVIDER_SITE_OTHER): Payer: BLUE CROSS/BLUE SHIELD | Admitting: Obstetrics & Gynecology

## 2016-09-13 VITALS — BP 96/67 | HR 102 | Wt 176.5 lb

## 2016-09-13 DIAGNOSIS — F32A Depression, unspecified: Secondary | ICD-10-CM

## 2016-09-13 DIAGNOSIS — Z3483 Encounter for supervision of other normal pregnancy, third trimester: Secondary | ICD-10-CM

## 2016-09-13 DIAGNOSIS — F331 Major depressive disorder, recurrent, moderate: Secondary | ICD-10-CM | POA: Diagnosis not present

## 2016-09-13 DIAGNOSIS — Z3480 Encounter for supervision of other normal pregnancy, unspecified trimester: Secondary | ICD-10-CM

## 2016-09-13 DIAGNOSIS — F329 Major depressive disorder, single episode, unspecified: Secondary | ICD-10-CM

## 2016-09-13 DIAGNOSIS — O99343 Other mental disorders complicating pregnancy, third trimester: Secondary | ICD-10-CM

## 2016-09-13 DIAGNOSIS — O99340 Other mental disorders complicating pregnancy, unspecified trimester: Secondary | ICD-10-CM

## 2016-09-13 NOTE — Progress Notes (Signed)
   PRENATAL VISIT NOTE  Subjective:  Diane [name] is a 21 y.o. G2P1001 at 1766w0d being seen today for ongoing prenatal care.  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  Patient reports no complaints. [facility], no current symptoms. Zoloft increased to 100 mg daily, patient says she is doing well on this.  Contractions: Not present. Vag. Bleeding: None.  Movement: Present. Denies leaking of fluid.   The following portions of the patient's history were reviewed and updated as appropriate: allergies, current medications, past family history, past medical history, past social history, past surgical history and problem list. Problem list updated.  Objective:   Vitals:   09/13/16 1549  BP: 96/67  Pulse: (!) 102  Weight: 176 lb 8 oz (80.1 kg)    Fetal Status: Fetal Heart Rate (bpm): 146 Fundal Height: 31 cm Movement: Present     General:  Alert, oriented and cooperative. Patient is in no acute distress.  Skin: Skin is warm and dry. No rash noted.   Cardiovascular: Normal heart rate noted  Respiratory: Normal respiratory effort, no problems with respiration noted  Abdomen: Soft, gravid, appropriate for gestational age.  Pain/Pressure: Absent     Pelvic: Cervical exam deferred        Extremities: Normal range of motion.  Edema: None  Mental Status:  Normal mood and affect. Normal behavior. Normal judgment and thought content.   Assessment and Plan:  Pregnancy: G2P1001 at 6866w0d  1. Depression affecting  pregnancy, antepartum--on Zoloft Continue Zoloft and therapy as prescribed. SI/HI precautions advised.  2. Supervision of other normal pregnancy, antepartum Preterm labor symptoms and general obstetric precautions including but not limited to vaginal bleeding, contractions, leaking of fluid and fetal movement were reviewed in detail with the patient. Please refer to After Visit Summary for other counseling recommendations.  Return in about 2 weeks (around 09/27/2016) for OB Visit.   Jaynie CollinsUgonna Margarett Viti, MD

## 2016-09-13 NOTE — Patient Instructions (Signed)
Return to clinic for any scheduled appointments or obstetric concerns, or go to [facility] for evaluation  

## 2016-09-22 DIAGNOSIS — F331 Major depressive disorder, recurrent, moderate: Secondary | ICD-10-CM | POA: Diagnosis not present

## 2016-09-29 ENCOUNTER — Ambulatory Visit (INDEPENDENT_AMBULATORY_CARE_PROVIDER_SITE_OTHER): Payer: BLUE CROSS/BLUE SHIELD | Admitting: Obstetrics & Gynecology

## 2016-09-29 VITALS — BP 105/70 | HR 93 | Wt 183.8 lb

## 2016-09-29 DIAGNOSIS — Z3483 Encounter for supervision of other normal pregnancy, third trimester: Secondary | ICD-10-CM

## 2016-09-29 DIAGNOSIS — Z3480 Encounter for supervision of other normal pregnancy, unspecified trimester: Secondary | ICD-10-CM

## 2016-09-29 MED ORDER — IBUPROFEN 600 MG PO TABS: 600.0000 mg | ORAL_TABLET | Freq: Three times a day (TID) | ORAL | 0 refills | Status: DC | PRN

## 2016-09-29 NOTE — Progress Notes (Signed)
Hip and pelvic pain   PRENATAL VISIT NOTE  Subjective:  Diane [name] is a 21 y.o. G2P1001 at 929w2d being seen today for ongoing prenatal care.  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  Patient reports occasional contractions and SP pain.  Contractions: Not present. Vag. Bleeding: None.  Movement: Present. Denies leaking of fluid.   The following portions of the patient's history were reviewed and updated as appropriate: allergies, current medications, past family history, past medical history, past social history, past surgical history and problem list. Problem list updated.  Objective:   Vitals:   09/29/16 0939  BP: 105/70  Pulse: 93  Weight: 183 lb 12.8 oz (83.4 kg)    Fetal Status: Fetal Heart Rate (bpm): 130 Fundal Height: 33 cm Movement: Present     General:  Alert, oriented and cooperative. Patient is in no acute distress.  Skin: Skin is warm and dry. No rash noted.   Cardiovascular: Normal heart rate noted  Respiratory: Normal respiratory effort, no problems with respiration noted  Abdomen: Soft, gravid, appropriate for gestational age.  Pain/Pressure: Present     Pelvic: Cervical exam deferred        Extremities: Normal range of motion.  Edema: None  Mental Status:  Normal mood and affect. Normal behavior. Normal judgment and thought content.   Assessment and Plan:  Pregnancy: G2P1001 at 4829w2d  1. Supervision of other normal pregnancy, antepartum Discomforts of pregnancy - ibuprofen (ADVIL,MOTRIN) 600 MG tablet; Take  1 tablet (600 mg total) by mouth every 8 (eight) hours as needed.  Dispense: 30 tablet; Refill: 0 Recommend 2/day only up to 35 weeks for MS pain Preterm labor symptoms and general obstetric precautions including but not limited to vaginal bleeding, contractions, leaking of fluid and fetal movement were reviewed in detail with the patient. Please refer to After Visit Summary for other counseling recommendations.  Return in about 2 weeks (around 10/13/2016).   Scheryl DarterJames Maame Dack, MD

## 2016-09-29 NOTE — Patient Instructions (Signed)
Back Pain in Pregnancy Back pain during pregnancy is common. Back pain may be caused by several factors that are related to changes during your pregnancy. Follow these instructions at home: Managing pain, stiffness, and swelling  If directed, apply ice for sudden (acute) back pain. ? Put ice in a plastic bag. ? Place a towel between your skin and the bag. ? Leave the ice on for 20 minutes, 2-3 times per day.  If directed, apply heat to the affected area before you exercise: ? Place a towel between your skin and the heat pack or heating pad. ? Leave the heat on for 20-30 minutes. ? Remove the heat if your skin turns bright red. This is especially important if you are unable to feel pain, heat, or cold. You may have a greater risk of getting burned. Activity  Exercise as told by your health care provider. Exercising is the best way to prevent or manage back pain.  Listen to your body when lifting. If lifting hurts, ask for help or bend your knees. This uses your leg muscles instead of your back muscles.  Squat down when picking up something from the floor. Do not bend over.  Only use bed rest as told by your health care provider. Bed rest should only be used for the most severe episodes of back pain. Standing, Sitting, and Lying Down  Do not stand in one place for long periods of time.  Use good posture when sitting. Make sure your head rests over your shoulders and is not hanging forward. Use a pillow on your lower back if necessary.  Try sleeping on your side, preferably the left side, with a pillow or two between your legs. If you are sore after a night's rest, your bed may be too soft. A firm mattress may provide more support for your back during pregnancy. General instructions  Do not wear high heels.  Eat a healthy diet. Try to gain weight within your health care provider's recommendations.  Use a maternity girdle, elastic sling, or back brace as told by your health care  provider.  Take over-the-counter and prescription medicines only as told by your health care provider.  Keep all follow-up visits as told by your health care provider. This is important. This includes any visits with any specialists, such as a physical therapist. Contact a health care provider if:  Your back pain interferes with your daily activities.  You have increasing pain in other parts of your body. Get help right away if:  You develop numbness, tingling, weakness, or problems with the use of your arms or legs.  You develop severe back pain that is not controlled with medicine.  You have a sudden change in bowel or bladder control.  You develop shortness of breath, dizziness, or you faint.  You develop nausea, vomiting, or sweating.  You have back pain that is a rhythmic, cramping pain similar to labor pains. Labor pain is usually 1-2 minutes apart, lasts for about 1 minute, and involves a bearing down feeling or pressure in your pelvis.  You have back pain and your water breaks or you have vaginal bleeding.  You have back pain or numbness that travels down your leg.  Your back pain developed after you fell.  You develop pain on one side of your back.  You see blood in your urine.  You develop skin blisters in the area of your back pain. This information is not intended to replace advice given to you   by your health care provider. Make sure you discuss any questions you have with your health care provider. Document Released: 05/26/2005 Document Revised: 07/24/2015 Document Reviewed: 10/30/2014 [employer] Interactive Patient Education  2018 [employer]  

## 2016-10-01 DIAGNOSIS — Z3482 Encounter for supervision of other normal pregnancy, second trimester: Secondary | ICD-10-CM | POA: Diagnosis not present

## 2016-10-01 DIAGNOSIS — Z3483 Encounter for supervision of other normal pregnancy, third trimester: Secondary | ICD-10-CM | POA: Diagnosis not present

## 2016-10-13 ENCOUNTER — Ambulatory Visit (INDEPENDENT_AMBULATORY_CARE_PROVIDER_SITE_OTHER): Payer: BLUE CROSS/BLUE SHIELD | Admitting: Obstetrics & Gynecology

## 2016-10-13 DIAGNOSIS — Z3480 Encounter for supervision of other normal pregnancy, unspecified trimester: Secondary | ICD-10-CM

## 2016-10-13 DIAGNOSIS — Z3483 Encounter for supervision of other normal pregnancy, third trimester: Secondary | ICD-10-CM

## 2016-10-13 NOTE — Patient Instructions (Signed)
Third Trimester of Pregnancy The third trimester is from week 28 through week 40 (months 7 through 9). The third trimester is a time when the unborn baby (fetus) is growing rapidly. At the end of the ninth month, the fetus is about 20 inches in length and weighs 6-10 pounds. Body changes during your third trimester Your body will continue to go through many changes during pregnancy. The changes vary from woman to woman. During the third trimester:  Your weight will continue to increase. You can expect to gain 25-35 pounds (11-16 kg) by the end of the pregnancy.  You may begin to get stretch marks on your hips, abdomen, and breasts.  You may urinate more often because the fetus is moving lower into your pelvis and pressing on your bladder.  You may develop or continue to have heartburn. This is caused by increased hormones that slow down muscles in the digestive tract.  You may develop or continue to have constipation because increased hormones slow digestion and cause the muscles that push waste through your intestines to relax.  You may develop hemorrhoids. These are swollen veins (varicose veins) in the rectum that can itch or be painful.  You may develop swollen, bulging veins (varicose veins) in your legs.  You may have increased body aches in the pelvis, back, or thighs. This is due to weight gain and increased hormones that are relaxing your joints.  You may have changes in your hair. These can include thickening of your hair, rapid growth, and changes in texture. Some women also have hair loss during or after pregnancy, or hair that feels dry or thin. Your hair will most likely return to normal after your baby is born.  Your breasts will continue to grow and they will continue to become tender. A yellow fluid (colostrum) may leak from your breasts. This is the first milk you are producing for your baby.  Your belly button may stick out.  You may notice more swelling in your hands,  face, or ankles.  You may have increased tingling or numbness in your hands, arms, and legs. The skin on your belly may also feel numb.  You may feel short of breath because of your expanding uterus.  You may have more problems sleeping. This can be caused by the size of your belly, increased need to urinate, and an increase in your body's metabolism.  You may notice the fetus "dropping," or moving lower in your abdomen (lightening).  You may have increased vaginal discharge.  You may notice your joints feel loose and you may have pain around your pelvic bone.  What to expect at prenatal visits You will have prenatal exams every 2 weeks until week 36. Then you will have weekly prenatal exams. During a routine prenatal visit:  You will be weighed to make sure you and the baby are growing normally.  Your blood pressure will be taken.  Your abdomen will be measured to track your baby's growth.  The fetal heartbeat will be listened to.  Any test results from the previous visit will be discussed.  You may have a cervical check near your due date to see if your cervix has softened or thinned (effaced).  You will be tested for Group B streptococcus. This happens between 35 and 37 weeks.  Your health care provider may ask you:  What your birth plan is.  How you are feeling.  If you are feeling the baby move.  If you have had   any abnormal symptoms, such as leaking fluid, bleeding, severe headaches, or abdominal cramping.  If you are using any tobacco products, including cigarettes, chewing tobacco, and electronic cigarettes.  If you have any questions.  Other tests or screenings that may be performed during your third trimester include:  Blood tests that check for low iron levels (anemia).  Fetal testing to check the health, activity level, and growth of the fetus. Testing is done if you have certain medical conditions or if there are problems during the  pregnancy.  Nonstress test (NST). This test checks the health of your baby to make sure there are no signs of problems, such as the baby not getting enough oxygen. During this test, a belt is placed around your belly. The baby is made to move, and its heart rate is monitored during movement.  What is false labor? False labor is a condition in which you feel small, irregular tightenings of the muscles in the womb (contractions) that usually go away with rest, changing position, or drinking water. These are called Braxton Hicks contractions. Contractions may last for hours, days, or even weeks before true labor sets in. If contractions come at regular intervals, become more frequent, increase in intensity, or become painful, you should see your health care provider. What are the signs of labor?  Abdominal cramps.  Regular contractions that start at 10 minutes apart and become stronger and more frequent with time.  Contractions that start on the top of the uterus and spread down to the lower abdomen and back.  Increased pelvic pressure and dull back pain.  A watery or bloody mucus discharge that comes from the vagina.  Leaking of amniotic fluid. This is also known as your "water breaking." It could be a slow trickle or a gush. Let your health care provider know if it has a color or strange odor. If you have any of these signs, call your health care provider right away, even if it is before your due date. Follow these instructions at home: Medicines  Follow your health care provider's instructions regarding medicine use. Specific medicines may be either safe or unsafe to take during pregnancy.  Take a prenatal vitamin that contains at least 600 micrograms (mcg) of folic acid.  If you develop constipation, try taking a stool softener if your health care provider approves. Eating and drinking  Eat a balanced diet that includes fresh fruits and vegetables, whole grains, good sources of protein  such as meat, eggs, or tofu, and low-fat dairy. Your health care provider will help you determine the amount of weight gain that is right for you.  Avoid raw meat and uncooked cheese. These carry germs that can cause birth defects in the baby.  If you have low calcium intake from food, talk to your health care provider about whether you should take a daily calcium supplement.  Eat four or five small meals rather than three large meals a day.  Limit foods that are high in fat and processed sugars, such as fried and sweet foods.  To prevent constipation: ? Drink enough fluid to keep your urine clear or pale yellow. ? Eat foods that are high in fiber, such as fresh fruits and vegetables, whole grains, and beans. Activity  Exercise only as directed by your health care provider. Most women can continue their usual exercise routine during pregnancy. Try to exercise for 30 minutes at least 5 days a week. Stop exercising if you experience uterine contractions.  Avoid heavy   lifting.  Do not exercise in extreme heat or humidity, or at high altitudes.  Wear low-heel, comfortable shoes.  Practice good posture.  You may continue to have sex unless your health care provider tells you otherwise. Relieving pain and discomfort  Take frequent breaks and rest with your legs elevated if you have leg cramps or low back pain.  Take warm sitz baths to soothe any pain or discomfort caused by hemorrhoids. Use hemorrhoid cream if your health care provider approves.  Wear a good support bra to prevent discomfort from breast tenderness.  If you develop varicose veins: ? Wear support pantyhose or compression stockings as told by your healthcare provider. ? Elevate your feet for 15 minutes, 3-4 times a day. Prenatal care  Write down your questions. Take them to your prenatal visits.  Keep all your prenatal visits as told by your health care provider. This is important. Safety  Wear your seat belt at  all times when driving.  Make a list of emergency phone numbers, including numbers for family, friends, the hospital, and police and fire departments. General instructions  Avoid cat litter boxes and soil used by cats. These carry germs that can cause birth defects in the baby. If you have a cat, ask someone to clean the litter box for you.  Do not travel far distances unless it is absolutely necessary and only with the approval of your health care provider.  Do not use hot tubs, steam rooms, or saunas.  Do not drink alcohol.  Do not use any products that contain nicotine or tobacco, such as cigarettes and e-cigarettes. If you need help quitting, ask your health care provider.  Do not use any medicinal herbs or unprescribed drugs. These chemicals affect the formation and growth of the baby.  Do not douche or use tampons or scented sanitary pads.  Do not cross your legs for long periods of time.  To prepare for the arrival of your baby: ? Take prenatal classes to understand, practice, and ask questions about labor and delivery. ? Make a trial run to the hospital. ? Visit the hospital and tour the maternity area. ? Arrange for maternity or paternity leave through employers. ? Arrange for family and friends to take care of pets while you are in the hospital. ? Purchase a rear-facing car seat and make sure you know how to install it in your car. ? Pack your hospital bag. ? Prepare the baby's nursery. Make sure to remove all pillows and stuffed animals from the baby's crib to prevent suffocation.  Visit your dentist if you have not gone during your pregnancy. Use a soft toothbrush to brush your teeth and be gentle when you floss. Contact a health care provider if:  You are unsure if you are in labor or if your water has broken.  You become dizzy.  You have mild pelvic cramps, pelvic pressure, or nagging pain in your abdominal area.  You have lower back pain.  You have persistent  nausea, vomiting, or diarrhea.  You have an unusual or bad smelling vaginal discharge.  You have pain when you urinate. Get help right away if:  Your water breaks before 37 weeks.  You have regular contractions less than 5 minutes apart before 37 weeks.  You have a fever.  You are leaking fluid from your vagina.  You have spotting or bleeding from your vagina.  You have severe abdominal pain or cramping.  You have rapid weight loss or weight gain.    You have shortness of breath with chest pain.  You notice sudden or extreme swelling of your face, hands, ankles, feet, or legs.  Your baby makes fewer than 10 movements in 2 hours.  You have severe headaches that do not go away when you take medicine.  You have vision changes. Summary  The third trimester is from week 28 through week 40, months 7 through 9. The third trimester is a time when the unborn baby (fetus) is growing rapidly.  During the third trimester, your discomfort may increase as you and your baby continue to gain weight. You may have abdominal, leg, and back pain, sleeping problems, and an increased need to urinate.  During the third trimester your breasts will keep growing and they will continue to become tender. A yellow fluid (colostrum) may leak from your breasts. This is the first milk you are producing for your baby.  False labor is a condition in which you feel small, irregular tightenings of the muscles in the womb (contractions) that eventually go away. These are called Braxton Hicks contractions. Contractions may last for hours, days, or even weeks before true labor sets in.  Signs of labor can include: abdominal cramps; regular contractions that start at 10 minutes apart and become stronger and more frequent with time; watery or bloody mucus discharge that comes from the vagina; increased pelvic pressure and dull back pain; and leaking of amniotic fluid. This information is not intended to replace advice  given to you by your health care provider. Make sure you discuss any questions you have with your health care provider. Document Released: 02/09/2001 Document Revised: 07/24/2015 Document Reviewed: 04/18/2012 Elsevier Interactive Patient Education  2017 [employer]  

## 2016-10-13 NOTE — Progress Notes (Signed)
   PRENATAL VISIT NOTE  Subjective:  Diane [name] is a 21 y.o. G2P1001 at 4138w2d being seen today for ongoing prenatal care.  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  Patient reports no complaints.  Contractions: Not present. Vag. Bleeding: None.  Movement: Present. Denies leaking of fluid.   The following portions of the patient's history were reviewed and updated as appropriate: allergies, current medications, past family history, past medical history, past social history, past surgical history and problem list. Problem list updated.  Objective:   Vitals:   10/13/16 1552  BP: 112/71  Pulse: (!) 111  Weight: 178 lb 9.6 oz (81 kg)    Fetal Status: Fetal Heart Rate (bpm): 162 Fundal Height: 35 cm Movement: Present     General:  Alert, oriented and cooperative. Patient is in no acute distress.  Skin: Skin is warm and dry. No rash noted.   Cardiovascular: Normal heart rate noted  Respiratory: Normal respiratory effort, no problems with respiration noted  Abdomen: Soft, gravid, appropriate for gestational age.  Pain/Pressure: Absent     Pelvic: Cervical exam deferred        Extremities: Normal range of motion.  Edema: None  Mental Status:  Normal mood and affect. Normal behavior. Normal judgment and thought content.   Assessment and Plan:  Pregnancy: G2P1001 at 238w2d  1. Supervision of other normal pregnancy, antepartum Weight gain poor with normal FH  Preterm labor symptoms and general obstetric precautions including but not  limited to vaginal bleeding, contractions, leaking of fluid and fetal movement were reviewed in detail with the patient. Please refer to After Visit Summary for other counseling recommendations.  Return in about 1 week (around 10/20/2016).   Scheryl DarterJames Gwynn Crossley, MD

## 2016-10-19 ENCOUNTER — Other Ambulatory Visit (HOSPITAL_COMMUNITY)
Admission: RE | Admit: 2016-10-19 | Discharge: 2016-10-19 | Disposition: A | Discharge status: Home / Self Care | Payer: BLUE CROSS/BLUE SHIELD | Source: Ambulatory Visit | Attending: Obstetrics & Gynecology | Admitting: Obstetrics & Gynecology

## 2016-10-19 ENCOUNTER — Ambulatory Visit (INDEPENDENT_AMBULATORY_CARE_PROVIDER_SITE_OTHER): Payer: BLUE CROSS/BLUE SHIELD | Admitting: Obstetrics & Gynecology

## 2016-10-19 VITALS — BP 102/64 | HR 109 | Wt 180.0 lb

## 2016-10-19 DIAGNOSIS — Z3483 Encounter for supervision of other normal pregnancy, third trimester: Secondary | ICD-10-CM | POA: Diagnosis not present

## 2016-10-19 DIAGNOSIS — O09899 Supervision of other high risk pregnancies, unspecified trimester: Secondary | ICD-10-CM

## 2016-10-19 DIAGNOSIS — Z3480 Encounter for supervision of other normal pregnancy, unspecified trimester: Secondary | ICD-10-CM

## 2016-10-19 DIAGNOSIS — Z3A36 36 weeks gestation of pregnancy: Secondary | ICD-10-CM | POA: Diagnosis not present

## 2016-10-19 DIAGNOSIS — O09893 Supervision of other high risk pregnancies, third trimester: Secondary | ICD-10-CM

## 2016-10-19 NOTE — Progress Notes (Signed)
   PRENATAL VISIT NOTE  Subjective:  Diane [name] is a 21 y.o. G2P1001 at [redacted]w[redacted]d being seen today for ongoing prenatal care.  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  Patient reports no complaints.  Contractions: Not present. Vag. Bleeding: None.  Movement: Present. Denies leaking of fluid.   The following portions of the patient's history were reviewed and updated as appropriate: allergies, current medications, past family history, past medical history, past social history, past surgical history and problem list. Problem list updated.  Objective:   Vitals:   10/19/16 1547  BP: 102/64  Pulse: (!) 109  Weight: 180 lb (81.6 kg)    Fetal Status: Fetal Heart Rate (bpm): 155 Fundal Height: 35 cm Movement: Present     General:  Alert, oriented and cooperative. Patient is in no acute distress.  Skin: Skin is warm and dry. No rash noted.   Cardiovascular: Normal heart rate noted  Respiratory: Normal respiratory effort, no problems with respiration noted  Abdomen: Soft, gravid, appropriate for gestational age.  Pain/Pressure: Present     Pelvic: Cervical exam performed        Extremities: Normal range of motion.     Mental Status:  Normal mood and affect. Normal behavior. Normal judgment and thought content.   Assessment and Plan:  Pregnancy: G2P1001 at [redacted]w[redacted]d  1. Supervision of other normal pregnancy, antepartum Routine testing - Strep Gp B NAA - GC/Chlamydia probe amp ([place])not at [facility]  2. Short interval between pregnancies  affecting pregnancy, antepartum   Preterm labor symptoms and general obstetric precautions including but not limited to vaginal bleeding, contractions, leaking of fluid and fetal movement were reviewed in detail with the patient. Please refer to After Visit Summary for other counseling recommendations.  Return in about 1 week (around 10/26/2016).   Scheryl Darter, MD

## 2016-10-19 NOTE — Patient Instructions (Signed)

## 2016-10-20 LAB — GC/CHLAMYDIA PROBE AMP (~~LOC~~) NOT AT ARMC
CHLAMYDIA, DNA PROBE: NEGATIVE
Neisseria Gonorrhea: NEGATIVE

## 2016-10-21 LAB — STREP GP B NAA: Strep Gp B NAA: NEGATIVE

## 2016-10-26 ENCOUNTER — Ambulatory Visit (INDEPENDENT_AMBULATORY_CARE_PROVIDER_SITE_OTHER): Payer: BLUE CROSS/BLUE SHIELD | Admitting: Obstetrics and Gynecology

## 2016-10-26 ENCOUNTER — Encounter: Payer: Self-pay | Admitting: Obstetrics and Gynecology

## 2016-10-26 ENCOUNTER — Encounter: Payer: Self-pay | Admitting: *Deleted

## 2016-10-26 VITALS — BP 99/66 | HR 102 | Wt 183.0 lb

## 2016-10-26 DIAGNOSIS — Z6791 Unspecified blood type, Rh negative: Secondary | ICD-10-CM

## 2016-10-26 DIAGNOSIS — F329 Major depressive disorder, single episode, unspecified: Secondary | ICD-10-CM

## 2016-10-26 DIAGNOSIS — Z3480 Encounter for supervision of other normal pregnancy, unspecified trimester: Secondary | ICD-10-CM

## 2016-10-26 DIAGNOSIS — O26899 Other specified pregnancy related conditions, unspecified trimester: Secondary | ICD-10-CM

## 2016-10-26 DIAGNOSIS — F32A Depression, unspecified: Secondary | ICD-10-CM

## 2016-10-26 DIAGNOSIS — O99340 Other mental disorders complicating pregnancy, unspecified trimester: Secondary | ICD-10-CM

## 2016-10-26 DIAGNOSIS — O09899 Supervision of other high risk pregnancies, unspecified trimester: Secondary | ICD-10-CM

## 2016-10-26 NOTE — Progress Notes (Signed)
Subjective:  Diane [name] is a 21 y.o. G2P1001 at [redacted]w[redacted]d being seen today for ongoing prenatal care.  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  Patient reports occasional contractions.  Contractions: Not present. Vag. Bleeding: None.  Movement: Present. Denies leaking of fluid.   The following portions of the patient's history were reviewed and updated as appropriate: allergies, current medications, past family history, past medical history, past social history, past surgical history and problem list. Problem list updated.  Objective:   Vitals:   10/26/16 1016  BP: 99/66  Pulse: (!) 102  Weight: 183 lb (83 kg)    Fetal Status: Fetal Heart Rate (bpm): 128   Movement: Present     General:  Alert, oriented and cooperative. Patient is in no acute distress.  Skin: Skin is warm and dry. No rash noted.   Cardiovascular: Normal heart rate noted  Respiratory: Normal respiratory effort, no problems with respiration noted  Abdomen: Soft, gravid, appropriate for gestational age. Pain/Pressure: Present     Pelvic:  Cervical exam deferred        Extremities: Normal range of motion.  Edema: None  Mental Status: Normal mood and affect. Normal behavior. Normal judgment and thought content.   Urinalysis:      Assessment and Plan:  Pregnancy: G2P1001 at [redacted]w[redacted]d  1. Rh negative state in antepartum period S/P Rhogam 08/23/16  2. Depression affecting pregnancy, antepartum--on Zoloft Stable  3. Supervision of other normal pregnancy, antepartum Labor  precautions GBS negative  Term labor symptoms and general obstetric precautions including but not limited to vaginal bleeding, contractions, leaking of fluid and fetal movement were reviewed in detail with the patient. Please refer to After Visit Summary for other counseling recommendations.  Return in about 1 week (around 11/02/2016) for OB visit.   Hermina Staggers, MD

## 2016-10-26 NOTE — Patient Instructions (Signed)
Vaginal Delivery Vaginal delivery means that you will give birth by pushing your baby out of your birth canal (vagina). A team of health care providers will help you before, during, and after vaginal delivery. Birth experiences are unique for every woman and every pregnancy, and birth experiences vary depending on where you choose to give birth. What should I do to prepare for my baby's birth? Before your baby is born, it is important to talk with your health care provider about:  Your labor and delivery preferences. These may include: ? Medicines that you may be given. ? How you will manage your pain. This might include non-medical pain relief techniques or injectable pain relief such as epidural analgesia. ? How you and your baby will be monitored during labor and delivery. ? Who may be in the labor and delivery room with you. ? Your feelings about surgical delivery of your baby (cesarean delivery, or C-section) if this becomes necessary. ? Your feelings about receiving donated blood through an IV tube (blood transfusion) if this becomes necessary.  Whether you are able: ? To take pictures or videos of the birth. ? To eat during labor and delivery. ? To move around, walk, or change positions during labor and delivery.  What to expect after your baby is born, such as: ? Whether delayed umbilical cord clamping and cutting is offered. ? Who will care for your baby right after birth. ? Medicines or tests that may be recommended for your baby. ? Whether breastfeeding is supported in your hospital or birth center. ? How long you will be in the hospital or birth center.  How any medical conditions you have may affect your baby or your labor and delivery experience.  To prepare for your baby's birth, you should also:  Attend all of your health care visits before delivery (prenatal visits) as recommended by your health care provider. This is important.  Prepare your home for your baby's  arrival. Make sure that you have: ? Diapers. ? Baby clothing. ? Feeding equipment. ? Safe sleeping arrangements for you and your baby.  Install a car seat in your vehicle. Have your car seat checked by a certified car seat installer to make sure that it is installed safely.  Think about who will help you with your new baby at home for at least the first several weeks after delivery.  What can I expect when I arrive at the birth center or hospital? Once you are in labor and have been admitted into the hospital or birth center, your health care provider may:  Review your pregnancy history and any concerns you have.  Insert an IV tube into one of your veins. This is used to give you fluids and medicines.  Check your blood pressure, pulse, temperature, and heart rate (vital signs).  Check whether your bag of water (amniotic sac) has broken (ruptured).  Talk with you about your birth plan and discuss pain control options.  Monitoring Your health care provider may monitor your contractions (uterine monitoring) and your baby's heart rate (fetal monitoring). You may need to be monitored:  Often, but not continuously (intermittently).  All the time or for long periods at a time (continuously). Continuous monitoring may be needed if: ? You are taking certain medicines, such as medicine to relieve pain or make your contractions stronger. ? You have pregnancy or labor complications.  Monitoring may be done by:  Placing a special stethoscope or a handheld monitoring device on your abdomen to   check your baby's heartbeat, and feeling your abdomen for contractions. This method of monitoring does not continuously record your baby's heartbeat or your contractions.  Placing monitors on your abdomen (external monitors) to record your baby's heartbeat and the frequency and length of contractions. You may not have to wear external monitors all the time.  Placing monitors inside of your uterus  (internal monitors) to record your baby's heartbeat and the frequency, length, and strength of your contractions. ? Your health care provider may use internal monitors if he or she needs more information about the strength of your contractions or your baby's heart rate. ? Internal monitors are put in place by passing a thin, flexible wire through your vagina and into your uterus. Depending on the type of monitor, it may remain in your uterus or on your baby's head until birth. ? Your health care provider will discuss the benefits and risks of internal monitoring with you and will ask for your permission before inserting the monitors.  Telemetry. This is a type of continuous monitoring that can be done with external or internal monitors. Instead of having to stay in bed, you are able to move around during telemetry. Ask your health care provider if telemetry is an option for you.  Physical exam Your health care provider may perform a physical exam. This may include:  Checking whether your baby is positioned: ? With the head toward your vagina (head-down). This is most common. ? With the head toward the top of your uterus (head-up or breech). If your baby is in a breech position, your health care provider may try to turn your baby to a head-down position so you can deliver vaginally. If it does not seem that your baby can be born vaginally, your provider may recommend surgery to deliver your baby. In rare cases, you may be able to deliver vaginally if your baby is head-up (breech delivery). ? Lying sideways (transverse). Babies that are lying sideways cannot be delivered vaginally.  Checking your cervix to determine: ? Whether it is thinning out (effacing). ? Whether it is opening up (dilating). ? How low your baby has moved into your birth canal.  What are the three stages of labor and delivery?  Normal labor and delivery is divided into the following three stages: Stage 1  Stage 1 is the  longest stage of labor, and it can last for hours or days. Stage 1 includes: ? Early labor. This is when contractions may be irregular, or regular and mild. Generally, early labor contractions are more than 10 minutes apart. ? Active labor. This is when contractions get longer, more regular, more frequent, and more intense. ? The transition phase. This is when contractions happen very close together, are very intense, and may last longer than during any other part of labor.  Contractions generally feel mild, infrequent, and irregular at first. They get stronger, more frequent (about every 2-3 minutes), and more regular as you progress from early labor through active labor and transition.  Many women progress through stage 1 naturally, but you may need help to continue making progress. If this happens, your health care provider may talk with you about: ? Rupturing your amniotic sac if it has not ruptured yet. ? Giving you medicine to help make your contractions stronger and more frequent.  Stage 1 ends when your cervix is completely dilated to 4 inches (10 cm) and completely effaced. This happens at the end of the transition phase. Stage 2  Once   your cervix is completely effaced and dilated to 4 inches (10 cm), you may start to feel an urge to push. It is common for the body to naturally take a rest before feeling the urge to push, especially if you received an epidural or certain other pain medicines. This rest period may last for up to 1-2 hours, depending on your unique labor experience.  During stage 2, contractions are generally less painful, because pushing helps relieve contraction pain. Instead of contraction pain, you may feel stretching and burning pain, especially when the widest part of your baby's head passes through the vaginal opening (crowning).  Your health care provider will closely monitor your pushing progress and your baby's progress through the vagina during stage 2.  Your  health care provider may massage the area of skin between your vaginal opening and anus (perineum) or apply warm compresses to your perineum. This helps it stretch as the baby's head starts to crown, which can help prevent perineal tearing. ? In some cases, an incision may be made in your perineum (episiotomy) to allow the baby to pass through the vaginal opening. An episiotomy helps to make the opening of the vagina larger to allow more room for the baby to fit through.  It is very important to breathe and focus so your health care provider can control the delivery of your baby's head. Your health care provider may have you decrease the intensity of your pushing, to help prevent perineal tearing.  After delivery of your baby's head, the shoulders and the rest of the body generally deliver very quickly and without difficulty.  Once your baby is delivered, the umbilical cord may be cut right away, or this may be delayed for 1-2 minutes, depending on your baby's health. This may vary among health care providers, hospitals, and birth centers.  If you and your baby are healthy enough, your baby may be placed on your chest or abdomen to help maintain the baby's temperature and to help you bond with each other. Some mothers and babies start breastfeeding at this time. Your health care team will dry your baby and help keep your baby warm during this time.  Your baby may need immediate care if he or she: ? Showed signs of distress during labor. ? Has a medical condition. ? Was born too early (prematurely). ? Had a bowel movement before birth (meconium). ? Shows signs of difficulty transitioning from being inside the uterus to being outside of the uterus. If you are planning to breastfeed, your health care team will help you begin a feeding. Stage 3  The third stage of labor starts immediately after the birth of your baby and ends after you deliver the placenta. The placenta is an organ that develops  during pregnancy to provide oxygen and nutrients to your baby in the womb.  Delivering the placenta may require some pushing, and you may have mild contractions. Breastfeeding can stimulate contractions to help you deliver the placenta.  After the placenta is delivered, your uterus should tighten (contract) and become firm. This helps to stop bleeding in your uterus. To help your uterus contract and to control bleeding, your health care provider may: ? Give you medicine by injection, through an IV tube, by mouth, or through your rectum (rectally). ? Massage your abdomen or perform a vaginal exam to remove any blood clots that are left in your uterus. ? Empty your bladder by placing a thin, flexible tube (catheter) into your bladder. ? Encourage   you to breastfeed your baby. After labor is over, you and your baby will be monitored closely to ensure that you are both healthy until you are ready to go home. Your health care team will teach you how to care for yourself and your baby. This information is not intended to replace advice given to you by your health care provider. Make sure you discuss any questions you have with your health care provider. Document Released: 11/25/2007 Document Revised: 09/05/2015 Document Reviewed: 03/02/2015 Elsevier Interactive Patient Education  2018 [employer]  

## 2016-11-02 ENCOUNTER — Encounter: Payer: Self-pay | Admitting: Obstetrics & Gynecology

## 2016-11-02 ENCOUNTER — Ambulatory Visit (INDEPENDENT_AMBULATORY_CARE_PROVIDER_SITE_OTHER): Payer: BLUE CROSS/BLUE SHIELD | Admitting: Obstetrics & Gynecology

## 2016-11-02 DIAGNOSIS — Z3480 Encounter for supervision of other normal pregnancy, unspecified trimester: Secondary | ICD-10-CM

## 2016-11-02 NOTE — Patient Instructions (Signed)
Vaginal Delivery Vaginal delivery means that you will give birth by pushing your baby out of your birth canal (vagina). A team of health care providers will help you before, during, and after vaginal delivery. Birth experiences are unique for every woman and every pregnancy, and birth experiences vary depending on where you choose to give birth. What should I do to prepare for my baby's birth? Before your baby is born, it is important to talk with your health care provider about:  Your labor and delivery preferences. These may include: ? Medicines that you may be given. ? How you will manage your pain. This might include non-medical pain relief techniques or injectable pain relief such as epidural analgesia. ? How you and your baby will be monitored during labor and delivery. ? Who may be in the labor and delivery room with you. ? Your feelings about surgical delivery of your baby (cesarean delivery, or C-section) if this becomes necessary. ? Your feelings about receiving donated blood through an IV tube (blood transfusion) if this becomes necessary.  Whether you are able: ? To take pictures or videos of the birth. ? To eat during labor and delivery. ? To move around, walk, or change positions during labor and delivery.  What to expect after your baby is born, such as: ? Whether delayed umbilical cord clamping and cutting is offered. ? Who will care for your baby right after birth. ? Medicines or tests that may be recommended for your baby. ? Whether breastfeeding is supported in your hospital or birth center. ? How long you will be in the hospital or birth center.  How any medical conditions you have may affect your baby or your labor and delivery experience.  To prepare for your baby's birth, you should also:  Attend all of your health care visits before delivery (prenatal visits) as recommended by your health care provider. This is important.  Prepare your home for your baby's  arrival. Make sure that you have: ? Diapers. ? Baby clothing. ? Feeding equipment. ? Safe sleeping arrangements for you and your baby.  Install a car seat in your vehicle. Have your car seat checked by a certified car seat installer to make sure that it is installed safely.  Think about who will help you with your new baby at home for at least the first several weeks after delivery.  What can I expect when I arrive at the birth center or hospital? Once you are in labor and have been admitted into the hospital or birth center, your health care provider may:  Review your pregnancy history and any concerns you have.  Insert an IV tube into one of your veins. This is used to give you fluids and medicines.  Check your blood pressure, pulse, temperature, and heart rate (vital signs).  Check whether your bag of water (amniotic sac) has broken (ruptured).  Talk with you about your birth plan and discuss pain control options.  Monitoring Your health care provider may monitor your contractions (uterine monitoring) and your baby's heart rate (fetal monitoring). You may need to be monitored:  Often, but not continuously (intermittently).  All the time or for long periods at a time (continuously). Continuous monitoring may be needed if: ? You are taking certain medicines, such as medicine to relieve pain or make your contractions stronger. ? You have pregnancy or labor complications.  Monitoring may be done by:  Placing a special stethoscope or a handheld monitoring device on your abdomen to   check your baby's heartbeat, and feeling your abdomen for contractions. This method of monitoring does not continuously record your baby's heartbeat or your contractions.  Placing monitors on your abdomen (external monitors) to record your baby's heartbeat and the frequency and length of contractions. You may not have to wear external monitors all the time.  Placing monitors inside of your uterus  (internal monitors) to record your baby's heartbeat and the frequency, length, and strength of your contractions. ? Your health care provider may use internal monitors if he or she needs more information about the strength of your contractions or your baby's heart rate. ? Internal monitors are put in place by passing a thin, flexible wire through your vagina and into your uterus. Depending on the type of monitor, it may remain in your uterus or on your baby's head until birth. ? Your health care provider will discuss the benefits and risks of internal monitoring with you and will ask for your permission before inserting the monitors.  Telemetry. This is a type of continuous monitoring that can be done with external or internal monitors. Instead of having to stay in bed, you are able to move around during telemetry. Ask your health care provider if telemetry is an option for you.  Physical exam Your health care provider may perform a physical exam. This may include:  Checking whether your baby is positioned: ? With the head toward your vagina (head-down). This is most common. ? With the head toward the top of your uterus (head-up or breech). If your baby is in a breech position, your health care provider may try to turn your baby to a head-down position so you can deliver vaginally. If it does not seem that your baby can be born vaginally, your provider may recommend surgery to deliver your baby. In rare cases, you may be able to deliver vaginally if your baby is head-up (breech delivery). ? Lying sideways (transverse). Babies that are lying sideways cannot be delivered vaginally.  Checking your cervix to determine: ? Whether it is thinning out (effacing). ? Whether it is opening up (dilating). ? How low your baby has moved into your birth canal.  What are the three stages of labor and delivery?  Normal labor and delivery is divided into the following three stages: Stage 1  Stage 1 is the  longest stage of labor, and it can last for hours or days. Stage 1 includes: ? Early labor. This is when contractions may be irregular, or regular and mild. Generally, early labor contractions are more than 10 minutes apart. ? Active labor. This is when contractions get longer, more regular, more frequent, and more intense. ? The transition phase. This is when contractions happen very close together, are very intense, and may last longer than during any other part of labor.  Contractions generally feel mild, infrequent, and irregular at first. They get stronger, more frequent (about every 2-3 minutes), and more regular as you progress from early labor through active labor and transition.  Many women progress through stage 1 naturally, but you may need help to continue making progress. If this happens, your health care provider may talk with you about: ? Rupturing your amniotic sac if it has not ruptured yet. ? Giving you medicine to help make your contractions stronger and more frequent.  Stage 1 ends when your cervix is completely dilated to 4 inches (10 cm) and completely effaced. This happens at the end of the transition phase. Stage 2  Once   your cervix is completely effaced and dilated to 4 inches (10 cm), you may start to feel an urge to push. It is common for the body to naturally take a rest before feeling the urge to push, especially if you received an epidural or certain other pain medicines. This rest period may last for up to 1-2 hours, depending on your unique labor experience.  During stage 2, contractions are generally less painful, because pushing helps relieve contraction pain. Instead of contraction pain, you may feel stretching and burning pain, especially when the widest part of your baby's head passes through the vaginal opening (crowning).  Your health care provider will closely monitor your pushing progress and your baby's progress through the vagina during stage 2.  Your  health care provider may massage the area of skin between your vaginal opening and anus (perineum) or apply warm compresses to your perineum. This helps it stretch as the baby's head starts to crown, which can help prevent perineal tearing. ? In some cases, an incision may be made in your perineum (episiotomy) to allow the baby to pass through the vaginal opening. An episiotomy helps to make the opening of the vagina larger to allow more room for the baby to fit through.  It is very important to breathe and focus so your health care provider can control the delivery of your baby's head. Your health care provider may have you decrease the intensity of your pushing, to help prevent perineal tearing.  After delivery of your baby's head, the shoulders and the rest of the body generally deliver very quickly and without difficulty.  Once your baby is delivered, the umbilical cord may be cut right away, or this may be delayed for 1-2 minutes, depending on your baby's health. This may vary among health care providers, hospitals, and birth centers.  If you and your baby are healthy enough, your baby may be placed on your chest or abdomen to help maintain the baby's temperature and to help you bond with each other. Some mothers and babies start breastfeeding at this time. Your health care team will dry your baby and help keep your baby warm during this time.  Your baby may need immediate care if he or she: ? Showed signs of distress during labor. ? Has a medical condition. ? Was born too early (prematurely). ? Had a bowel movement before birth (meconium). ? Shows signs of difficulty transitioning from being inside the uterus to being outside of the uterus. If you are planning to breastfeed, your health care team will help you begin a feeding. Stage 3  The third stage of labor starts immediately after the birth of your baby and ends after you deliver the placenta. The placenta is an organ that develops  during pregnancy to provide oxygen and nutrients to your baby in the womb.  Delivering the placenta may require some pushing, and you may have mild contractions. Breastfeeding can stimulate contractions to help you deliver the placenta.  After the placenta is delivered, your uterus should tighten (contract) and become firm. This helps to stop bleeding in your uterus. To help your uterus contract and to control bleeding, your health care provider may: ? Give you medicine by injection, through an IV tube, by mouth, or through your rectum (rectally). ? Massage your abdomen or perform a vaginal exam to remove any blood clots that are left in your uterus. ? Empty your bladder by placing a thin, flexible tube (catheter) into your bladder. ? Encourage   you to breastfeed your baby. After labor is over, you and your baby will be monitored closely to ensure that you are both healthy until you are ready to go home. Your health care team will teach you how to care for yourself and your baby. This information is not intended to replace advice given to you by your health care provider. Make sure you discuss any questions you have with your health care provider. Document Released: 11/25/2007 Document Revised: 09/05/2015 Document Reviewed: 03/02/2015 Elsevier Interactive Patient Education  2018 [employer]  

## 2016-11-02 NOTE — Progress Notes (Signed)
   PRENATAL VISIT NOTE  Subjective:  Diane [name] is a 21 y.o. G2P1001 at 5744w1d being seen today for ongoing prenatal care.  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  Patient reports no complaints.  Contractions: Irregular. Vag. Bleeding: None.  Movement: Present. Denies leaking of fluid.   The following portions of the patient's history were reviewed and updated as appropriate: allergies, current medications, past family history, past medical history, past social history, past surgical history and problem list. Problem list updated.  Objective:   Vitals:   11/02/16 1517  BP: 110/73  Pulse: (!) 109  Weight: 182 lb (82.6 kg)    Fetal Status: Fetal Heart Rate (bpm): 140   Movement: Present     General:  Alert, oriented and cooperative. Patient is in no acute distress.  Skin: Skin is warm and dry. No rash noted.   Cardiovascular: Normal heart rate noted  Respiratory: Normal respiratory effort, no problems with respiration noted  Abdomen: Soft, gravid, appropriate for gestational age.  Pain/Pressure: Present     Pelvic: Cervical exam deferred        Extremities: Normal range of motion.  Edema: None  Mental Status:  Normal mood and affect. Normal behavior. Normal judgment and thought content.   Assessment and Plan:  Pregnancy: G2P1001 at 344w1d  1. Supervision of other normal pregnancy, antepartum   Term labor symptoms and general obstetric precautions including but not limited to vaginal bleeding, contractions, leaking of fluid and  fetal movement were reviewed in detail with the patient. Please refer to After Visit Summary for other counseling recommendations.  Return in about 1 week (around 11/09/2016).   Scheryl DarterJames Kaelob Persky, MD

## 2016-11-07 ENCOUNTER — Inpatient Hospital Stay (HOSPITAL_COMMUNITY)
Admission: AD | Admit: 2016-11-07 | Discharge: 2016-11-09 | DRG: 775 | Disposition: A | Discharge status: Home / Self Care | Payer: BLUE CROSS/BLUE SHIELD | Source: Ambulatory Visit | Attending: Obstetrics and Gynecology | Admitting: Obstetrics and Gynecology

## 2016-11-07 DIAGNOSIS — Z3A39 39 weeks gestation of pregnancy: Secondary | ICD-10-CM

## 2016-11-07 DIAGNOSIS — F329 Major depressive disorder, single episode, unspecified: Secondary | ICD-10-CM | POA: Diagnosis present

## 2016-11-07 DIAGNOSIS — Z6791 Unspecified blood type, Rh negative: Secondary | ICD-10-CM

## 2016-11-07 DIAGNOSIS — F419 Anxiety disorder, unspecified: Secondary | ICD-10-CM | POA: Diagnosis not present

## 2016-11-07 DIAGNOSIS — O26893 Other specified pregnancy related conditions, third trimester: Secondary | ICD-10-CM | POA: Diagnosis not present

## 2016-11-07 DIAGNOSIS — F332 Major depressive disorder, recurrent severe without psychotic features: Secondary | ICD-10-CM | POA: Diagnosis present

## 2016-11-07 DIAGNOSIS — O99344 Other mental disorders complicating childbirth: Secondary | ICD-10-CM | POA: Diagnosis not present

## 2016-11-07 DIAGNOSIS — Z3A38 38 weeks gestation of pregnancy: Secondary | ICD-10-CM | POA: Diagnosis not present

## 2016-11-07 DIAGNOSIS — F32A Depression, unspecified: Secondary | ICD-10-CM | POA: Diagnosis present

## 2016-11-07 DIAGNOSIS — O99340 Other mental disorders complicating pregnancy, unspecified trimester: Secondary | ICD-10-CM | POA: Diagnosis present

## 2016-11-07 DIAGNOSIS — O26899 Other specified pregnancy related conditions, unspecified trimester: Secondary | ICD-10-CM

## 2016-11-07 MED ORDER — OXYTOCIN 10 UNIT/ML IJ SOLN
INTRAMUSCULAR | Status: AC
  Administered 2016-11-08: 10 [IU]
  Filled 2016-11-07: qty 1

## 2016-11-07 NOTE — MAU Note (Signed)
Pt c/o contractions every 3 minutes apart that started tonight at 8pm. Pt c/o feeling some fluid leaking when she arrived at the hospital. Pt denies bleeding. Pt states baby is moving normally.

## 2016-11-08 ENCOUNTER — Encounter (HOSPITAL_COMMUNITY): Payer: Self-pay

## 2016-11-08 DIAGNOSIS — Z3A38 38 weeks gestation of pregnancy: Secondary | ICD-10-CM

## 2016-11-08 LAB — TYPE AND SCREEN
ABO/RH(D): B NEG
Antibody Screen: NEGATIVE

## 2016-11-08 LAB — CBC
HEMATOCRIT: 34.7 % — AB (ref 36.0–46.0)
HEMOGLOBIN: 11.9 g/dL — AB (ref 12.0–15.0)
MCH: 31.1 pg (ref 26.0–34.0)
MCHC: 34.3 g/dL (ref 30.0–36.0)
MCV: 90.6 fL (ref 78.0–100.0)
Platelets: 177 10*3/uL (ref 150–400)
RBC: 3.83 MIL/uL — ABNORMAL LOW (ref 3.87–5.11)
RDW: 13.7 % (ref 11.5–15.5)
WBC: 7.4 10*3/uL (ref 4.0–10.5)

## 2016-11-08 LAB — RPR: RPR: NONREACTIVE

## 2016-11-08 MED ORDER — LIDOCAINE HCL (PF) 1 % IJ SOLN
INTRAMUSCULAR | Status: AC
  Administered 2016-11-08: 30 mL
  Filled 2016-11-08: qty 30

## 2016-11-08 MED ORDER — COCONUT OIL OIL: 1.0000 "application " | TOPICAL_OIL | Status: DC | PRN

## 2016-11-08 MED ORDER — ZOLPIDEM TARTRATE 5 MG PO TABS: 5.0000 mg | ORAL_TABLET | Freq: Every evening | ORAL | Status: DC | PRN

## 2016-11-08 MED ORDER — ONDANSETRON HCL 4 MG/2ML IJ SOLN: 4.0000 mg | INTRAMUSCULAR | Status: DC | PRN

## 2016-11-08 MED ORDER — SERTRALINE HCL 100 MG PO TABS
100.0000 mg | ORAL_TABLET | Freq: Every day | ORAL | Status: DC
  Administered 2016-11-08: 100 mg via ORAL
  Filled 2016-11-08 (×2): qty 1

## 2016-11-08 MED ORDER — WITCH HAZEL-GLYCERIN EX PADS: 1.0000 "application " | MEDICATED_PAD | CUTANEOUS | Status: DC | PRN

## 2016-11-08 MED ORDER — SIMETHICONE 80 MG PO CHEW: 80.0000 mg | CHEWABLE_TABLET | ORAL | Status: DC | PRN

## 2016-11-08 MED ORDER — LIDOCAINE HCL (PF) 1 % IJ SOLN
30.0000 mL | INTRAMUSCULAR | Status: DC | PRN
  Filled 2016-11-08: qty 30

## 2016-11-08 MED ORDER — LACTATED RINGERS IV SOLN: 500.0000 mL | INTRAVENOUS | Status: DC | PRN

## 2016-11-08 MED ORDER — ONDANSETRON HCL 4 MG/2ML IJ SOLN: 4.0000 mg | Freq: Four times a day (QID) | INTRAMUSCULAR | Status: DC | PRN

## 2016-11-08 MED ORDER — FLEET ENEMA 7-19 GM/118ML RE ENEM: 1.0000 | ENEMA | RECTAL | Status: DC | PRN

## 2016-11-08 MED ORDER — ACETAMINOPHEN 325 MG PO TABS: 650.0000 mg | ORAL_TABLET | ORAL | Status: DC | PRN

## 2016-11-08 MED ORDER — DIBUCAINE 1 % RE OINT: 1.0000 "application " | TOPICAL_OINTMENT | RECTAL | Status: DC | PRN

## 2016-11-08 MED ORDER — ACETAMINOPHEN 325 MG PO TABS
650.0000 mg | ORAL_TABLET | ORAL | Status: DC | PRN
  Administered 2016-11-08: 650 mg via ORAL
  Filled 2016-11-08: qty 2

## 2016-11-08 MED ORDER — OXYTOCIN 10 UNIT/ML IJ SOLN: 10.0000 [IU] | Freq: Once | INTRAMUSCULAR | Status: AC

## 2016-11-08 MED ORDER — TETANUS-DIPHTH-ACELL PERTUSSIS 5-2.5-18.5 LF-MCG/0.5 IM SUSP: 0.5000 mL | Freq: Once | INTRAMUSCULAR | Status: DC

## 2016-11-08 MED ORDER — SENNOSIDES-DOCUSATE SODIUM 8.6-50 MG PO TABS
2.0000 | ORAL_TABLET | ORAL | Status: DC
  Administered 2016-11-08: 2 via ORAL
  Filled 2016-11-08: qty 2

## 2016-11-08 MED ORDER — FENTANYL CITRATE (PF) 100 MCG/2ML IJ SOLN: 100.0000 ug | INTRAMUSCULAR | Status: DC | PRN

## 2016-11-08 MED ORDER — IBUPROFEN 600 MG PO TABS
600.0000 mg | ORAL_TABLET | Freq: Four times a day (QID) | ORAL | Status: DC
  Administered 2016-11-08 – 2016-11-09 (×7): 600 mg via ORAL
  Filled 2016-11-08 (×7): qty 1

## 2016-11-08 MED ORDER — OXYCODONE-ACETAMINOPHEN 5-325 MG PO TABS
1.0000 | ORAL_TABLET | ORAL | Status: DC | PRN
  Administered 2016-11-08: 1 via ORAL
  Filled 2016-11-08: qty 1

## 2016-11-08 MED ORDER — ONDANSETRON HCL 4 MG PO TABS: 4.0000 mg | ORAL_TABLET | ORAL | Status: DC | PRN

## 2016-11-08 MED ORDER — SOD CITRATE-CITRIC ACID 500-334 MG/5ML PO SOLN: 30.0000 mL | ORAL | Status: DC | PRN

## 2016-11-08 MED ORDER — OXYCODONE-ACETAMINOPHEN 5-325 MG PO TABS
2.0000 | ORAL_TABLET | ORAL | Status: DC | PRN
Start: 2016-11-08 — End: 2016-11-08

## 2016-11-08 MED ORDER — OXYTOCIN 40 UNITS IN LACTATED RINGERS INFUSION - SIMPLE MED: 2.5000 [IU]/h | INTRAVENOUS | Status: DC

## 2016-11-08 MED ORDER — DIPHENHYDRAMINE HCL 25 MG PO CAPS: 25.0000 mg | ORAL_CAPSULE | Freq: Four times a day (QID) | ORAL | Status: DC | PRN

## 2016-11-08 MED ORDER — OXYTOCIN BOLUS FROM INFUSION: 500.0000 mL | Freq: Once | INTRAVENOUS | Status: DC

## 2016-11-08 MED ORDER — PRENATAL MULTIVITAMIN CH
1.0000 | ORAL_TABLET | Freq: Every day | ORAL | Status: DC
  Administered 2016-11-08 – 2016-11-09 (×2): 1 via ORAL
  Filled 2016-11-08 (×2): qty 1

## 2016-11-08 MED ORDER — RHO D IMMUNE GLOBULIN 1500 UNIT/2ML IJ SOSY
300.0000 ug | PREFILLED_SYRINGE | Freq: Once | INTRAMUSCULAR | Status: AC
  Administered 2016-11-08: 300 ug via INTRAMUSCULAR
  Filled 2016-11-08: qty 2

## 2016-11-08 MED ORDER — BENZOCAINE-MENTHOL 20-0.5 % EX AERO
1.0000 "application " | INHALATION_SPRAY | CUTANEOUS | Status: DC | PRN
  Filled 2016-11-08: qty 56

## 2016-11-08 NOTE — Progress Notes (Signed)
CSW received consult for hx of Anxiety and Depression.  CSW met with MOB to offer support and complete assessment.    When CSW arrived, MOB was bonding with infant as evident by engaging in skin to skin. FOB (DJ) was also present.  MOB gave CSW permission to meet with MOB while FOB was present. CSW was polite and receptive to meeting with CSW.  CSW asked about MOB MH hx and MOB acknowledged a hx of anxiety and depression. MOB disclosed that MOB was dx with anxiety around age 21 and dx with depression July 2018.  MOB confirmed [facility] inpatient treatment and report MOB had SI but no plan.  CSW assessed for safety and MOB denied SI, HI, and DV.  MOB appeared to have insight and awareness about her MH and did not present with any acute MH symptoms.   CSW provided education regarding the baby blues period vs. perinatal mood disorders, discussed treatment and gave resources for mental health follow up if concerns arise.  CSW recommends self-evaluation during the postpartum time period using the New Mom Checklist from Postpartum Progress and encouraged MOB to contact a medical professional if symptoms are noted at any time.    MOB reports being an established patient at the [facility], and plans to schedule an appointment in 2 weeks. MOB denied PPD signs and symptoms with MOB's oldest son Pamalee Leyden (08/12/15).   CSW identifies no further need for intervention and no barriers to discharge at this time.   Laurey Arrow, MSW, LCSW Clinical Social Work [phone]

## 2016-11-08 NOTE — Progress Notes (Signed)
Post Partum Day 1 Subjective:  no complaints. No bm or flatus. No other complaints at this time.  Objective: Blood pressure 104/66, pulse 94, temperature 98.2 F (36.8 C), temperature source Oral, resp. rate 14, height 5\' 9"  (1.753 m), weight 84.4 kg (186 lb), last menstrual period 01/24/2016, SpO2 100 %, unknown if currently breastfeeding.  Physical Exam:  General: alert and cooperative Lochia: appropriate Uterine Fundus: firm DVT Evaluation: No evidence of DVT seen on physical exam. No significant calf/ankle edema.   Recent Labs  11/07/16 2345  HGB 11.9*  HCT 34.7*    Assessment/Plan: Plan for discharge tomorrow   LOS: 1 day   Diane [name] 11/08/2016, 7:12 AM   OB FELLOW MEDICAL STUDENT NOTE ATTESTATION  I confirm that I have verified the information documented in the medical student's note and that I have also personally performed the physical exam and all medical decision making activities.   Frederik PearJulie P Ethin Drummond, MD OB Fellow 11/08/2016

## 2016-11-08 NOTE — Lactation Note (Signed)
This note was copied from a baby's chart. Lactation Consultation Note  Patient Name: Diane [name] ZOXWR'UToday's Date: 11/08/2016 Reason for consult: Initial assessment;Early term [id number]  Visited with 2nd time Mom, baby 7411 hrs old.  Baby STS on Mom's chest.  Mom has breastfed baby 6 times already, and denies any difficulty.  Reviewed importance of keeping baby STS and feeding on cue.  Goal of 8-12 feedings per 24 hrs.   Lactation brochure left with Mom.  Encouraged Mom to call for concerns.   Consult Status Consult Status: Follow-up Date: 11/09/16 Follow-up type: In-patient    Judee ClaraSmith, Juliene Kirsh E 11/08/2016, 11:50 AM

## 2016-11-08 NOTE — H&P (Signed)
LABOR AND DELIVERY ADMISSION HISTORY AND PHYSICAL NOTE  Diane KillianJalisa [name] is a 21 y.o. female G2P1001 with IUP at 2670w6d by 9-week U/S who presented to MAU in active labor, reporting contractions q3 minutes since 8pm. Shortly after moving to L&D patient had SROM and and SVD within the next two contractions.    Prenatal History/Complications: Prenatal care at [facility] Complications: - Depression and anxiety (on Zoloft; admission for SI on 7/10 per EHR) - Rh negative - Short interval pregnancy  Past Medical History: Past Medical History:  Diagnosis Date  . Anemia   . Anxiety   . Headache   . UTI (lower urinary tract infection)     Past Surgical History: History reviewed. No pertinent surgical history.  Obstetrical History: OB History    Gravida Para Term Preterm AB Living   2 1 1     1    SAB TAB Ectopic Multiple Live Births         0 1      Social History: Social History   Social History  . Marital status: Single    Spouse name: N/A  . Number of children: N/A  . Years of education: N/A   Social History Main Topics  . Smoking status: Never Smoker  . Smokeless tobacco: Never Used  . Alcohol use No  . Drug use: No  . Sexual activity: Yes    Partners: Female    Birth control/ protection: None   Other Topics Concern  . None   Social History Narrative  . None    Family History: Family History  Problem Relation Age of Onset  . Hypertension Mother   . Hypertension Maternal Grandmother   . Diabetes Maternal Grandmother   . Congestive Heart Failure Maternal Grandmother   . Hypertension Maternal Uncle   . Diabetes Maternal Uncle   . Hypertension Maternal Uncle   . Diabetes Maternal Uncle     Allergies: Allergies  Allergen Reactions  . Cefzil [Cefprozil] Hives  . Rocephin [Ceftriaxone Sodium In Dextrose] Hives    Prescriptions Prior to Admission  Medication Sig Dispense Refill Last Dose  . acetaminophen (TYLENOL) 500 MG tablet Take 1,000 mg by mouth every 6  (six) hours as needed for mild pain, moderate pain, fever or headache.    Taking  . ondansetron (ZOFRAN ODT) 8 MG disintegrating tablet Take 1 tablet (8 mg total) by mouth every 8 (eight) hours as needed for nausea or vomiting. (Patient not taking: Reported on 11/02/2016) 6 tablet 0 Not Taking  . Prenatal Vit-Fe Fumarate-FA (PRENATAL MULTIVITAMIN) TABS tablet Take 1 tablet by mouth daily at 12 noon. (Patient taking differently: Take 1 tablet by mouth at bedtime. ) 1 tablet 10 Taking  . sertraline (ZOLOFT) 100 MG tablet Take 1 tablet (100 mg total) by mouth at bedtime. 30 tablet 1 Taking  . zolpidem (AMBIEN) 5 MG tablet Take 1 tablet (5 mg total) by mouth at bedtime as needed for sleep. 30 tablet 1 Taking     Review of Systems  All systems reviewed and negative except as stated in HPI  Physical Exam Blood pressure 110/71, pulse 92, temperature 98.6 F (37 C), temperature source Oral, resp. rate 18, height 5\' 9"  (1.753 m), weight 186 lb (84.4 kg), last menstrual period 01/24/2016, SpO2 99 %, not currently breastfeeding. General appearance: alert, in apparent pain with contractions  Lungs: no respiratory distress; normal WOB Heart: regular rate  Abdomen: soft, non-tender Extremities: No calf swelling  Fetal monitoring prior to delivery: 135, moderate  variability, +acel, no decel  Prenatal labs: ABO, Rh: --/--/B NEG (09/09 2345) Antibody: PENDING (09/09 2345) Rubella: immune RPR: Non Reactive (06/21 1118)  HBsAg: Negative (02/14 1055)  HIV: Non-reactive, Non-reactive (12/14 0000)  GBS: Negative (08/21 1610)  2-hr GTT: normal Genetic screening:  Normal NT and AFP Anatomy US: normal  Prenatal Transfer Tool  Maternal Diabetes: No Genetic Screening: Normal Maternal Ultrasounds/Referrals: Normal Fetal Ultrasounds or other Referrals:  None Maternal Substance Abuse:  No Significant Maternal Medications:  Meds include: Zoloft, Zofran Significant Maternal Lab Results: RH negative  Results  for orders placed or performed during the hospital encounter of 11/07/16 (from the past 24 hour(s))  CBC   Collection Time: 11/07/16 11:45 PM  Result Value Ref Range   WBC 7.4 4.0 - 10.5 K/uL   RBC 3.83 (L) 3.87 - 5.11 MIL/uL   Hemoglobin 11.9 (L) 12.0 - 15.0 g/dL   HCT 40.9 (L) 81.1 - 91.4 %   MCV 90.6 78.0 - 100.0 fL   MCH 31.1 26.0 - 34.0 pg   MCHC 34.3 30.0 - 36.0 g/dL   RDW 78.2 95.6 - 21.3 %   Platelets 177 150 - 400 K/uL  Type and screen [facility]    Collection Time: 11/07/16 11:45 PM  Result Value Ref Range   ABO/RH(D) B NEG    Antibody Screen PENDING    Sample Expiration 11/10/2016     Patient Active Problem List   Diagnosis Date Noted  . Indication for care in labor or delivery 11/08/2016  . Major depressive disorder, recurrent severe without psychotic features (HCC) 09/07/2016  . Back pain affecting pregnancy 07/21/2016  . Encounter for supervision of normal pregnancy, antepartum 04/14/2016  . Short interval between pregnancies affecting pregnancy, antepartum 04/14/2016  . Anxiety 03/29/2015  . Rh negative state in antepartum period 03/29/2015  . 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 Depression affecting pregnancy, antepartum--on Zoloft 03/29/2015    Assessment: Diane [name] is a 21 y.o. G2P1001 at [redacted]w[redacted]d who presented for SOL, now s/p NSVD. --Routine PP care --Rh negative: Rhogam eval --Anxiety and depression: continue home Zoloft. Consider 2-week PPV  [name] 11/08/2016, 12:59 AM

## 2016-11-09 ENCOUNTER — Encounter: Payer: Self-pay | Admitting: Obstetrics and Gynecology

## 2016-11-09 MED ORDER — NORETHINDRONE 0.35 MG PO TABS
1.0000 | ORAL_TABLET | Freq: Every day | ORAL | 11 refills | Status: DC
Start: 2016-11-09 — End: 2019-01-24

## 2016-11-09 MED ORDER — IBUPROFEN 600 MG PO TABS
600.0000 mg | ORAL_TABLET | Freq: Four times a day (QID) | ORAL | 0 refills | Status: DC
Start: 2016-11-09 — End: 2016-12-21

## 2016-11-09 NOTE — Discharge Summary (Signed)
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 yo [id number] at 4034w6d was admitted in Active Labor on 11/07/2016. Patient had an uncomplicated labor course as follows:  Membrane Rupture Time/Date: 11:48 PM ,11/08/2016   Intrapartum Procedures: Episiotomy: None [1]                                         Lacerations:  1st degree [2]  Patient had a delivery of a Viable infant. 11/07/2016  Information for the patient's newborn:  Diane [name], Boy Diane [[id number]][[id number]]  Delivery Method: Vaginal, Spontaneous Delivery (Filed from Delivery Summary)    Pateint had an uncomplicated postpartum course.  She is ambulating, tolerating a regular diet, passing flatus, and urinating well. Patient is discharged home in stable condition on 11/09/16.   Physical exam  Vitals:   11/08/16 1434 11/08/16 1836 11/08/16 2220 11/09/16 0522  BP: 107/66 108/64  (!) 101/57  Pulse: 96 97  87  Resp: 16 18  18   Temp: 98.2 F (36.8 C) 98.4 F (36.9 C) 97.9 F (36.6 C) 98.2 F (36.8 C)  TempSrc: Oral Oral Oral Oral  SpO2:      Weight:      Height:       General: alert, cooperative and no distress Lochia: appropriate Uterine Fundus: firm Incision: N/A DVT Evaluation: No evidence of DVT seen on physical  exam. No cords or calf tenderness. No significant calf/ankle edema. Labs: Lab Results  Component Value Date   WBC 7.4 11/07/2016   HGB 11.9 (L) 11/07/2016   HCT 34.7 (L) 11/07/2016   MCV 90.6 11/07/2016   PLT 177 11/07/2016   CMP Latest Ref Rng & Units 09/07/2016  Glucose 65 - 99 mg/dL 85  BUN 6 - 20 mg/dL 6  Creatinine 1.020.44 - 7.251.00 mg/dL 3.660.53  Sodium 440135 - 347145 mmol/L 136  Potassium 3.5 - 5.1 mmol/L 3.3(L)  Chloride 101 - 111 mmol/L 103  CO2 22 - 32 mmol/L 22  Calcium 8.9 - 10.3 mg/dL 8.9  Total Protein 6.5 - 8.1 g/dL 7.3  Total Bilirubin 0.3 - 1.2 mg/dL 1.2  Alkaline Phos 38 - 126 U/L 78  AST 15 - 41 U/L 21  ALT 14 - 54 U/L 10(L)  Discharge instruction: per After Visit Summary and "Baby and Me Booklet".  After visit meds:  Allergies as of 11/09/2016      Reactions   Cefzil [cefprozil] Hives   Rocephin [ceftriaxone Sodium In Dextrose] Hives      Medication List    STOP taking these medications   zolpidem 5 MG tablet Commonly known as:  AMBIEN     TAKE these medications   acetaminophen 500 MG tablet Commonly known as:  TYLENOL Take 1,000 mg by mouth every 6 (six) hours as needed for mild pain, moderate pain, fever or headache.   ibuprofen 600 MG tablet Commonly known as:  ADVIL,MOTRIN Take 1 tablet (600 mg total) by mouth every 6 (six) hours.   norethindrone 0.35 MG tablet Commonly known as:  ORTHO MICRONOR Take 1 tablet (0.35 mg total) by mouth daily.   prenatal multivitamin Tabs tablet Take 1 tablet by mouth daily at 12 noon. What changed:  when to take this   sertraline 100 MG tablet Commonly known as:  ZOLOFT Take 1 tablet (100 mg total) by mouth at bedtime.            Discharge Care Instructions        Start     Ordered   11/09/16 0000  ibuprofen (ADVIL,MOTRIN) 600 MG tablet  Every 6 hours     11/09/16 0927   11/09/16 0000  norethindrone (ORTHO MICRONOR) 0.35 MG tablet  Daily     11/09/16 0927      Diet: routine diet  Activity:  Advance as tolerated. Pelvic rest for 6 weeks.   Outpatient follow up:2 weeks- mental health check Follow up Appt: Future Appointments Date Time Provider Department Center  11/24/2016 11:00 AM Adam Phenix, MD [facility] None   Follow up Visit:No Follow-up on file.  Postpartum contraception: Progesterone only pills  Newborn Data: Live born female  Birth Weight: 8 lb 5.5 oz (3785 g) APGAR: 9, 9  Baby Feeding: Breast Disposition:home with mother   11/09/2016 Lennox Solders, MD  CNM attestation I have seen and examined this patient and agree with above documentation in the resident's note.   Diane [name] is a 21 y.o. [id number] s/p SVD.   Pain is well controlled.  Plan for birth control is oral progesterone-only contraceptive.  Method of Feeding: breast  PE:  BP (!) 101/57 (BP Location: Left Arm)   Pulse 87   Temp 98.2 F (36.8 C) (Oral)   Resp 18   Ht  (1.753 m)   Wt 84.4 kg (186 lb)   LMP 01/24/2016   SpO2 100%   Breastfeeding? Unknown   BMI 27.47 kg/m  Fundus firm   Recent Labs  11/07/16 2345  HGB 11.9*  HCT 34.7*     Plan: discharge today - postpartum care discussed - f/u clinic in 2 weeks for mental health check, then 4 wks for postpartum visit   Cam Hai, CNM 10:42 AM 11/09/2016

## 2016-11-10 LAB — RH IG WORKUP (INCLUDES ABO/RH)
ABO/RH(D): B NEG
Fetal Screen: NEGATIVE
GESTATIONAL AGE(WKS): 38.6
Unit division: 0

## 2016-11-23 ENCOUNTER — Ambulatory Visit (INDEPENDENT_AMBULATORY_CARE_PROVIDER_SITE_OTHER): Payer: BLUE CROSS/BLUE SHIELD | Admitting: Obstetrics and Gynecology

## 2016-11-23 ENCOUNTER — Encounter: Payer: BLUE CROSS/BLUE SHIELD | Admitting: Obstetrics and Gynecology

## 2016-11-23 VITALS — BP 121/81 | HR 87 | Wt 168.0 lb

## 2016-11-23 DIAGNOSIS — F332 Major depressive disorder, recurrent severe without psychotic features: Secondary | ICD-10-CM

## 2016-11-23 NOTE — Progress Notes (Signed)
error 

## 2016-11-23 NOTE — Progress Notes (Signed)
Pt here for postpartum depression check. Pt with H/O depression. S/P TSVD on 11/07/16 VMI Breast feeding [place]berg score 0 today  Pt without complaints except for hormonal changes. Denies any Homo/Suicidal thoughts Doing well with Zoloft  PE AF VSS Lungs clear Heart RRR Abd soft + BS   A/P Depression        Postpartum TSVD  Doing well no S/Sx of postpartum depression Will continue with Zoloft qd F/U in 4 weeks for routine PP visit

## 2016-11-24 ENCOUNTER — Encounter: Payer: BLUE CROSS/BLUE SHIELD | Admitting: Obstetrics & Gynecology

## 2016-12-15 ENCOUNTER — Telehealth: Payer: Self-pay

## 2016-12-15 ENCOUNTER — Other Ambulatory Visit: Payer: Self-pay | Admitting: Obstetrics and Gynecology

## 2016-12-15 MED ORDER — SERTRALINE HCL 100 MG PO TABS: 100.0000 mg | ORAL_TABLET | Freq: Every day | ORAL | 3 refills | Status: DC

## 2016-12-15 NOTE — Telephone Encounter (Signed)
Patient notified of Rx refill

## 2016-12-15 NOTE — Telephone Encounter (Signed)
-----   Message from Catalina AntiguaPeggy Constant, MD sent at 12/15/2016  3:12 PM EDT ----- Regarding: RE: Rx Refill sent to [facility] on [address]!  I gave her 3 refills to hopefully allow her time to get in touch with her therapist or find a new therapist  Peggy ----- Message ----- From: Maretta BeesMcGlashan, Xianna Siverling J, RMA Sent: 12/15/2016   2:19 PM To: Catalina AntiguaPeggy Constant, MD Subject: Rx                                             Hi Dr. Jolayne Pantheronstant, Patient is requesting refill on her Zoloft, said that she is unable to get in touch with Dr. Hinton DyerPucilowski.  Please advice.

## 2016-12-18 ENCOUNTER — Inpatient Hospital Stay (HOSPITAL_COMMUNITY)
Admission: AD | Admit: 2016-12-18 | Discharge: 2016-12-18 | Disposition: A | Discharge status: Home / Self Care | Payer: BLUE CROSS/BLUE SHIELD | Source: Ambulatory Visit | Attending: Obstetrics and Gynecology | Admitting: Obstetrics and Gynecology

## 2016-12-18 ENCOUNTER — Encounter (HOSPITAL_COMMUNITY): Payer: Self-pay | Admitting: *Deleted

## 2016-12-18 DIAGNOSIS — N920 Excessive and frequent menstruation with regular cycle: Secondary | ICD-10-CM | POA: Diagnosis not present

## 2016-12-18 DIAGNOSIS — N939 Abnormal uterine and vaginal bleeding, unspecified: Secondary | ICD-10-CM | POA: Diagnosis not present

## 2016-12-18 LAB — URINALYSIS, ROUTINE W REFLEX MICROSCOPIC
Bilirubin Urine: NEGATIVE
GLUCOSE, UA: NEGATIVE mg/dL
HGB URINE DIPSTICK: NEGATIVE
Ketones, ur: NEGATIVE mg/dL
LEUKOCYTES UA: NEGATIVE
Nitrite: NEGATIVE
Protein, ur: NEGATIVE mg/dL
SPECIFIC GRAVITY, URINE: 1.011 (ref 1.005–1.030)
pH: 6 (ref 5.0–8.0)

## 2016-12-18 LAB — CBC
HCT: 37.6 % (ref 36.0–46.0)
Hemoglobin: 12.4 g/dL (ref 12.0–15.0)
MCH: 30.3 pg (ref 26.0–34.0)
MCHC: 33 g/dL (ref 30.0–36.0)
MCV: 91.9 fL (ref 78.0–100.0)
Platelets: 226 10*3/uL (ref 150–400)
RBC: 4.09 MIL/uL (ref 3.87–5.11)
RDW: 13.1 % (ref 11.5–15.5)
WBC: 5 10*3/uL (ref 4.0–10.5)

## 2016-12-18 NOTE — MAU Provider Note (Signed)
Chief Complaint:  Vaginal Bleeding   First Provider Initiated Contact with Patient 12/18/16 1543       HPI: Diane [name] is a 21 y.o. G2P2002 who presents to maternity admissions reporting heavy vaginal bleeding.  Period started 2 days ago and is very heavy, pad per hour.  Some clots.  Not too much pain, just cramps No fever. She reports vaginal bleeding, vaginal itching/burning, urinary symptoms, h/a, dizziness, n/v, or fever/chills.    Delivered 11/08/16.  Breastfeeding.  On POPs.  Vaginal Bleeding  The patient's primary symptoms include pelvic pain and vaginal bleeding. The patient's pertinent negatives include no genital itching, genital lesions or genital odor. This is a new problem. The current episode started in the past 7 days. The problem occurs constantly. The problem has been unchanged. The pain is mild. She is not pregnant. Pertinent negatives include no back pain, constipation, diarrhea, dysuria, fever, headaches, nausea or vomiting. The vaginal discharge was bloody. The vaginal bleeding is heavier than menses. She has been passing clots. She has not been passing tissue. Nothing aggravates the symptoms. She has tried nothing for the symptoms. She uses oral contraceptives for contraception.    RN Note: Pt presents with c/o heavy vaginal bleeding.  States period began 12/16/2016, flow was "normal" then got heavy today.  Reports passing small clots   Past Medical History: Past Medical History:  Diagnosis Date  . Anemia   . Anxiety   . Headache   . UTI (lower urinary tract infection)     Past obstetric history: OB History  Gravida Para Term Preterm AB Living  2 2 2     2   SAB TAB Ectopic Multiple Live Births        0 2    # Outcome Date GA Lbr Len/2nd Weight Sex Delivery Anes PTL Lv  2 Term 11/07/16 [redacted]w[redacted]d  8 lb 5.5 oz (3.785 kg) M Vag-Spont None  LIV  1 Term 08/12/15 [redacted]w[redacted]d 11:26 / 00:29 8 lb 11.2 oz (3.946 kg) M Vag-Spont EPI  LIV      Past Surgical History: History  reviewed. No pertinent surgical history.  Family History: Family History  Problem Relation Age of Onset  . Hypertension Mother   . Hypertension Maternal Grandmother   . Diabetes Maternal Grandmother   . Congestive Heart Failure Maternal Grandmother   . Hypertension Maternal Uncle   . Diabetes Maternal Uncle   . Hypertension Maternal Uncle   . Diabetes Maternal Uncle     Social History: Social History  Substance Use Topics  . Smoking status: Never Smoker  . Smokeless tobacco: Never Used  . Alcohol use No    Allergies:  Allergies  Allergen Reactions  . Cefzil [Cefprozil] Hives  . Rocephin [Ceftriaxone Sodium In Dextrose] Hives    Meds:  Prescriptions Prior to Admission  Medication Sig Dispense Refill Last Dose  . acetaminophen (TYLENOL) 500 MG tablet Take 1,000 mg by mouth every 6 (six) hours as needed for mild pain, moderate pain, fever or headache.    Not Taking  . ibuprofen (ADVIL,MOTRIN) 600 MG tablet Take 1 tablet (600 mg total) by mouth every 6 (six) hours. (Patient not taking: Reported on 11/23/2016) 30 tablet 0 Not Taking  . norethindrone (ORTHO MICRONOR) 0.35 MG tablet Take 1 tablet (0.35 mg total) by mouth daily. 1 Package 11 Taking  . Prenatal Vit-Fe Fumarate-FA (PRENATAL MULTIVITAMIN) TABS tablet Take 1 tablet by mouth daily at 12 noon. (Patient taking differently: Take 1 tablet by mouth at bedtime. )  1 tablet 10 Taking  . sertraline (ZOLOFT) 100 MG tablet Take 1 tablet (100 mg total) by mouth at bedtime. 30 tablet 3     I have reviewed patient's Past Medical Hx, Surgical Hx, Family Hx, Social Hx, medications and allergies.  ROS:  Review of Systems  Constitutional: Negative for fever.  Gastrointestinal: Negative for constipation, diarrhea, nausea and vomiting.  Genitourinary: Positive for pelvic pain and vaginal bleeding. Negative for dysuria.  Musculoskeletal: Negative for back pain.  Neurological: Negative for headaches.   Other systems negative      Physical Exam  Patient Vitals for the past 24 hrs:  BP Temp Temp src Pulse Resp SpO2  12/18/16 1525 124/78 98.1 F (36.7 C) Oral 86 18 99 %   Constitutional: Well-developed, well-nourished female in no acute distress.  Cardiovascular: normal rate and rhythm, no ectopy audible, S1 & S2 heard, no murmur Respiratory: normal effort, no distress. Lungs CTAB with no wheezes or crackles GI: Abd soft, non-tender.  Nondistended.  No rebound, No guarding.  Bowel Sounds audible  MS: Extremities nontender, no edema, normal ROM Neurologic: Alert and oriented x 4.   Grossly nonfocal. GU: Neg CVAT. Skin:  Warm and Dry Psych:  Affect appropriate.  PELVIC EXAM: Cervix pink, visually closed, without lesion, Moderate dark red blood, vaginal walls and external genitalia normal Bimanual exam: Cervix firm, anterior, neg CMT, uterus nontender, nonenlarged, adnexa without tenderness, enlargement, or mass    Labs: Results for orders placed or performed during the hospital encounter of 12/18/16 (from the past 24 hour(s))  Urinalysis, Routine w reflex microscopic     Status: Abnormal   Collection Time: 12/18/16  3:15 PM  Result Value Ref Range   Color, Urine STRAW (A) YELLOW   APPearance CLEAR CLEAR   Specific Gravity, Urine 1.011 1.005 - 1.030   pH 6.0 5.0 - 8.0   Glucose, UA NEGATIVE NEGATIVE mg/dL   Hgb urine dipstick NEGATIVE NEGATIVE   Bilirubin Urine NEGATIVE NEGATIVE   Ketones, ur NEGATIVE NEGATIVE mg/dL   Protein, ur NEGATIVE NEGATIVE mg/dL   Nitrite NEGATIVE NEGATIVE   Leukocytes, UA NEGATIVE NEGATIVE  CBC     Status: None   Collection Time: 12/18/16  3:58 PM  Result Value Ref Range   WBC 5.0 4.0 - 10.5 K/uL   RBC 4.09 3.87 - 5.11 MIL/uL   Hemoglobin 12.4 12.0 - 15.0 g/dL   HCT 14.737.6 82.936.0 - 56.246.0 %   MCV 91.9 78.0 - 100.0 fL   MCH 30.3 26.0 - 34.0 pg   MCHC 33.0 30.0 - 36.0 g/dL   RDW 13.013.1 86.511.5 - 78.415.5 %   Platelets 226 150 - 400 K/uL    Imaging:  No results found.  MAU  Course/MDM: I have ordered labs as follows: UA, CBC Imaging ordered: none Results reviewed. Hemoglobin is good, higher than last time   Treatments in MAU included none Pt stable at time of discharge.  Assessment: Post delivery bleeding, probably first menses No evidence of endometritis and retained products is not likely    Plan: Discharge home Recommend ibuprofen for decreased bleeidng and cramps Consulted Dr Constant. Expectant management Continue POPs   Encouraged to return here or to other Urgent Care/ED if she develops worsening of symptoms, increase in pain, fever, or other concerning symptoms.   Wynelle BourgeoisMarie Williams CNM, MSN Certified Nurse-Midwife 12/18/2016 3:43 PM

## 2016-12-18 NOTE — Discharge Instructions (Signed)
Dysfunctional Uterine Bleeding °Dysfunctional uterine bleeding is abnormal bleeding from the uterus. Dysfunctional uterine bleeding includes: °· A period that comes earlier or later than usual. °· A period that is lighter, heavier, or has blood clots. °· Bleeding between periods. °· Skipping one or more periods. °· Bleeding after sexual intercourse. °· Bleeding after menopause. ° °Follow these instructions at home: °Pay attention to any changes in your symptoms. Follow these instructions to help with your condition: °Eating and drinking °· Eat well-balanced meals. Include foods that are high in iron, such as liver, meat, shellfish, green leafy vegetables, and eggs. °· If you become constipated: °? Drink plenty of water. °? Eat fruits and vegetables that are high in water and fiber, such as spinach, carrots, raspberries, apples, and mango. °Medicines °· Take over-the-counter and prescription medicines only as told by your health care provider. °· Do not change medicines without talking with your health care provider. °· Aspirin or medicines that contain aspirin may make the bleeding worse. Do not take those medicines: °? During the week before your period. °? During your period. °· If you were prescribed iron pills, take them as told by your health care provider. Iron pills help to replace iron that your body loses because of this condition. °Activity °· If you need to change your sanitary pad or tampon more than one time every 2 hours: °? Lie in bed with your feet raised (elevated). °? Place a cold pack on your lower abdomen. °? Rest as much as possible until the bleeding stops or slows down. °· Do not try to lose weight until the bleeding has stopped and your blood iron level is back to normal. °Other Instructions °· For two months, write down: °? When your period starts. °? When your period ends. °? When any abnormal bleeding occurs. °? What problems you notice. °· Keep all follow up visits as told by your health  care provider. This is important. °Contact a health care provider if: °· You get light-headed or weak. °· You have nausea and vomiting. °· You cannot eat or drink without vomiting. °· You feel dizzy or have diarrhea while you are taking medicines. °· You are taking birth control pills or hormones, and you want to change them or stop taking them. °Get help right away if: °· You develop a fever or chills. °· You need to change your sanitary pad or tampon more than one time per hour. °· Your bleeding becomes heavier, or your flow contains clots more often. °· You develop pain in your abdomen. °· You lose consciousness. °· You develop a rash. °This information is not intended to replace advice given to you by your health care provider. Make sure you discuss any questions you have with your health care provider. °Document Released: 02/13/2000 Document Revised: 07/24/2015 Document Reviewed: 05/13/2014 °Elsevier Interactive Patient Education © 2018 [employer] ° °

## 2016-12-18 NOTE — MAU Note (Signed)
Pt presents with c/o heavy vaginal bleeding.  States period began 12/16/2016, flow was "normal" then got heavy today.  Reports passing small clots

## 2016-12-21 ENCOUNTER — Encounter: Payer: Self-pay | Admitting: Certified Nurse Midwife

## 2016-12-21 ENCOUNTER — Ambulatory Visit (INDEPENDENT_AMBULATORY_CARE_PROVIDER_SITE_OTHER): Payer: BLUE CROSS/BLUE SHIELD | Admitting: Certified Nurse Midwife

## 2016-12-21 NOTE — Progress Notes (Signed)
Post Partum Exam  Diane [name] is a 21 y.o. 342P2002 female who presents for a postpartum visit. She is 6 weeks postpartum following a spontaneous vaginal delivery. I have fully reviewed the prenatal and intrapartum course. The delivery was at 38+ gestational weeks.  Anesthesia: none. Postpartum course has been doing well. Baby's course has been doing well. Baby is feeding by breast. Bleeding moderate lochia. Bowel function is normal. Bladder function is normal. Patient is sexually active. Contraception method is oral progesterone-only contraceptive.  Postpartum depression screening:neg, score 0.   The following portions of the patient's history were reviewed and updated as appropriate: allergies, current medications, past family history, past medical history, past social history, past surgical history and problem list.  Review of Systems Pertinent items noted in HPI and remainder of comprehensive ROS otherwise negative.    Objective:  Last menstrual period 12/16/2016, currently breastfeeding.  General:  alert, cooperative and no distress   Breasts:  inspection negative, no nipple discharge or bleeding, no masses or nodularity palpable  Lungs: clear to auscultation bilaterally  Heart:  regular rate and rhythm, S1, S2 normal, no murmur, click, rub or gallop  Abdomen: soft, non-tender; bowel sounds normal; no masses,  no organomegaly  Pelvic Exam: Not performed.        Assessment:    Normal 4 week postpartum exam. Pap smear not done at today's visit.   Plan:   1. Contraception: oral progesterone-only contraceptive 2. Annual exam 04/15/16 3. Follow up in: 4 months or as needed.

## 2017-02-03 ENCOUNTER — Other Ambulatory Visit: Payer: Self-pay

## 2017-02-03 DIAGNOSIS — Z789 Other specified health status: Secondary | ICD-10-CM

## 2017-02-03 MED ORDER — PRENATE PIXIE 10-0.6-0.4-200 MG PO CAPS
1.0000 | ORAL_CAPSULE | Freq: Every day | ORAL | 12 refills | Status: DC
Start: 2017-02-03 — End: 2017-07-01

## 2017-03-27 ENCOUNTER — Other Ambulatory Visit: Payer: Self-pay

## 2017-03-27 ENCOUNTER — Ambulatory Visit (INDEPENDENT_AMBULATORY_CARE_PROVIDER_SITE_OTHER): Payer: BLUE CROSS/BLUE SHIELD

## 2017-03-27 ENCOUNTER — Ambulatory Visit (HOSPITAL_COMMUNITY)
Admission: EM | Admit: 2017-03-27 | Discharge: 2017-03-27 | Disposition: A | Discharge status: Home / Self Care | Payer: BLUE CROSS/BLUE SHIELD | Attending: Family Medicine | Admitting: Family Medicine

## 2017-03-27 ENCOUNTER — Encounter (HOSPITAL_COMMUNITY): Payer: Self-pay | Admitting: Family Medicine

## 2017-03-27 DIAGNOSIS — R05 Cough: Secondary | ICD-10-CM | POA: Diagnosis not present

## 2017-03-27 DIAGNOSIS — H6123 Impacted cerumen, bilateral: Secondary | ICD-10-CM | POA: Diagnosis not present

## 2017-03-27 DIAGNOSIS — J069 Acute upper respiratory infection, unspecified: Secondary | ICD-10-CM

## 2017-03-27 MED ORDER — NEOMYCIN-POLYMYXIN-HC 3.5-10000-1 OT SUSP: 4.0000 [drp] | Freq: Three times a day (TID) | OTIC | 0 refills | Status: DC

## 2017-03-27 NOTE — ED Provider Notes (Signed)
[facility]   [id number] 03/27/17 Arrival Time: 1220   SUBJECTIVE:  Diane [name] is a 22 y.o. female who presents to the urgent care with complaint of nasal discharge, sore throat and bilateral fullness x 2-3 days.  No fever.  Clear nasal drainage No cough Takes care of two infants at home.   Past Medical History:  Diagnosis Date  . Anemia   . Anxiety   . Headache   . UTI (lower urinary tract infection)    Family History  Problem Relation Age of Onset  . Hypertension Mother   . Hypertension Maternal Grandmother   . Diabetes Maternal Grandmother   . Congestive Heart Failure Maternal Grandmother   . Hypertension Maternal Uncle   . Diabetes Maternal Uncle   . Hypertension Maternal Uncle   . Diabetes Maternal Uncle    Social History   Socioeconomic History  . Marital status: Single    Spouse name: Not on file  . Number of children: Not on file  . Years of education: Not on file  . Highest education level: Not on file  Social Needs  . Financial resource strain: Not on file  . Food insecurity - worry: Not on file  . Food insecurity - inability: Not on file  . Transportation needs - medical: Not on file  . Transportation needs - non-medical: Not on file  Occupational History  . Not on file  Tobacco Use  . Smoking status: Never Smoker  . Smokeless tobacco: Never Used  Substance and Sexual Activity  . Alcohol use: No  . Drug use: No  . Sexual activity: Yes    Partners: Female    Birth control/protection: None  Other Topics Concern  . Not on file  Social History Narrative  . Not on file   Current Meds  Medication Sig  . acetaminophen (TYLENOL) 500 MG tablet Take 1,000 mg by mouth every 6 (six) hours as needed for mild pain, moderate pain, fever or headache.   . norethindrone (ORTHO MICRONOR) 0.35 MG tablet Take 1 tablet (0.35 mg total) by mouth daily.  . Prenat-FeAsp-Meth-FA-DHA w/o A (PRENATE PIXIE) 10-0.6-0.4-200 MG CAPS Take 1 tablet by mouth  daily.  . sertraline (ZOLOFT) 100 MG tablet Take 1 tablet (100 mg total) by mouth at bedtime.   Allergies  Allergen Reactions  . Cefzil [Cefprozil] Hives  . Rocephin [Ceftriaxone Sodium In Dextrose] Hives      ROS: As per HPI, remainder of ROS negative.   OBJECTIVE:   Vitals:   03/27/17 1315  BP: 126/89  Pulse: (!) 109  Temp: 97.6 F (36.4 C)  TempSrc: Oral  SpO2: 96%     General appearance: alert; no distress Eyes: PERRL; EOMI; conjunctiva normal HENT: normocephalic; atraumatic; TMs normal, canal bilateral cerumen, external ears normal without trauma; nasal mucosa normal with clear rhinorrhea; oral mucosa normal Neck: supple Back: no CVA tenderness Extremities: no cyanosis or edema; symmetrical with no gross deformities Skin: warm and dry Neurologic: normal gait; grossly normal Psychological: alert and cooperative; normal mood and affect     Labs:  Results for orders placed or performed during the hospital encounter of 12/18/16  Urinalysis, Routine w reflex microscopic  Result Value Ref Range   Color, Urine STRAW (A) YELLOW   APPearance CLEAR CLEAR   Specific Gravity, Urine 1.011 1.005 - 1.030   pH 6.0 5.0 - 8.0   Glucose, UA NEGATIVE NEGATIVE mg/dL   Hgb urine dipstick NEGATIVE NEGATIVE   Bilirubin Urine NEGATIVE NEGATIVE  Ketones, ur NEGATIVE NEGATIVE mg/dL   Protein, ur NEGATIVE NEGATIVE mg/dL   Nitrite NEGATIVE NEGATIVE   Leukocytes, UA NEGATIVE NEGATIVE  CBC  Result Value Ref Range   WBC 5.0 4.0 - 10.5 K/uL   RBC 4.09 3.87 - 5.11 MIL/uL   Hemoglobin 12.4 12.0 - 15.0 g/dL   HCT 16.137.6 09.636.0 - 04.546.0 %   MCV 91.9 78.0 - 100.0 fL   MCH 30.3 26.0 - 34.0 pg   MCHC 33.0 30.0 - 36.0 g/dL   RDW 40.913.1 81.111.5 - 91.415.5 %   Platelets 226 150 - 400 K/uL    Labs Reviewed - No data to display  Dg Chest 2 View  Result Date: 03/27/2017 CLINICAL DATA:  3 day history of cough. EXAM: CHEST  2 VIEW COMPARISON:  None. FINDINGS: The heart size and mediastinal contours  are within normal limits. Both lungs are clear. The visualized skeletal structures are unremarkable. IMPRESSION: No active cardiopulmonary disease. Electronically Signed   By: Kennith CenterEric  Mansell M.D.   On: 03/27/2017 13:42       ASSESSMENT & PLAN:  1. Bilateral impacted cerumen   2. Upper respiratory tract infection, unspecified type     Meds ordered this encounter  Medications  . neomycin-polymyxin-hydrocortisone (CORTISPORIN) 3.5-10000-1 OTIC suspension    Sig: Place 4 drops into both ears 3 (three) times daily.    Dispense:  10 mL    Refill:  0    Reviewed expectations re: course of current medical issues. Questions answered. Outlined signs and symptoms indicating need for more acute intervention. Patient verbalized understanding. After Visit Summary given.    Procedures:  Ear canals lavaged clear      Elvina SidleLauenstein, Gauge Winski, MD 03/27/17 1419

## 2017-03-27 NOTE — ED Triage Notes (Signed)
Nasal drainage, sore throat, both ear pain

## 2017-03-27 NOTE — Discharge Instructions (Signed)
Use the drops for the ears for 5 days

## 2017-07-01 ENCOUNTER — Other Ambulatory Visit: Payer: Self-pay | Admitting: Certified Nurse Midwife

## 2017-07-01 DIAGNOSIS — Z789 Other specified health status: Secondary | ICD-10-CM

## 2017-07-01 MED ORDER — PRENATE PIXIE 10-0.6-0.4-200 MG PO CAPS: 1.0000 | ORAL_CAPSULE | Freq: Every day | ORAL | 12 refills | Status: DC

## 2017-08-21 ENCOUNTER — Encounter (HOSPITAL_COMMUNITY): Payer: Self-pay

## 2017-08-21 ENCOUNTER — Emergency Department (HOSPITAL_COMMUNITY)
Admission: EM | Admit: 2017-08-21 | Discharge: 2017-08-21 | Disposition: A | Discharge status: Home / Self Care | Payer: BLUE CROSS/BLUE SHIELD | Attending: Emergency Medicine | Admitting: Emergency Medicine

## 2017-08-21 DIAGNOSIS — J029 Acute pharyngitis, unspecified: Secondary | ICD-10-CM | POA: Diagnosis not present

## 2017-08-21 DIAGNOSIS — E049 Nontoxic goiter, unspecified: Secondary | ICD-10-CM | POA: Insufficient documentation

## 2017-08-21 DIAGNOSIS — E010 Iodine-deficiency related diffuse (endemic) goiter: Secondary | ICD-10-CM

## 2017-08-21 LAB — GROUP A STREP BY PCR: Group A Strep by PCR: NOT DETECTED

## 2017-08-21 LAB — TSH: TSH: 2.148 u[IU]/mL (ref 0.350–4.500)

## 2017-08-21 LAB — I-STAT CHEM 8, ED
BUN: 10 mg/dL (ref 6–20)
CALCIUM ION: 1.14 mmol/L — AB (ref 1.15–1.40)
CREATININE: 0.6 mg/dL (ref 0.44–1.00)
Chloride: 102 mmol/L (ref 101–111)
Glucose, Bld: 88 mg/dL (ref 65–99)
HCT: 37 % (ref 36.0–46.0)
HEMOGLOBIN: 12.6 g/dL (ref 12.0–15.0)
Potassium: 3.6 mmol/L (ref 3.5–5.1)
SODIUM: 139 mmol/L (ref 135–145)
TCO2: 23 mmol/L (ref 22–32)

## 2017-08-21 LAB — I-STAT BETA HCG BLOOD, ED (MC, WL, AP ONLY)

## 2017-08-21 LAB — T4, FREE: FREE T4: 0.9 ng/dL (ref 0.82–1.77)

## 2017-08-21 MED ORDER — LIDOCAINE VISCOUS HCL 2 % MT SOLN: 15.0000 mL | OROMUCOSAL | 0 refills | Status: DC | PRN

## 2017-08-21 NOTE — ED Triage Notes (Signed)
Pt presents for evaluation of sore throat x 3 days. Pt reports some congestion.

## 2017-08-21 NOTE — Discharge Instructions (Addendum)
Get help right away if: °You develop signs of severe dehydration, such as: °Decreased urination. This means urinating only very small amounts or urinating fewer than 3 times in a 24-hour period. °Urine that is very dark. °Dry mouth, tongue, or lips. °Decreased tears or sunken eyes. °Dry skin. °Rapid breathing. °Decreased activity or being very sleepy. °Pale skin. °Fingertips taking longer than 2 seconds to turn pink after a gentle squeeze. °Weight loss. °You have a severe headache. °You have a stiff neck. °You experience changes in your behavior. °You have chest pain. °You have trouble breathing. °

## 2017-08-21 NOTE — ED Provider Notes (Signed)
[facility]ENC[facility] Provider Note   CSN: [id number] Arrival date & time: 08/21/17  [id number]     History   Chief Complaint Chief Complaint  Patient presents with  . Sore Throat    HPI Diane [name] is a 22 y.o. female who present to the ED for c/o sore throat. Onset 3 days ago. Pain is worse with swallowing. Pain is improved with use of ibuprofen. Associated dull headache, nasal congestion. No known contacts with similar sxs.  9 mos Postpartum. Has had weight gain and constant cold sensation. HPI  Past Medical History:  Diagnosis Date  . Anemia   . Anxiety   . Headache   . UTI (lower urinary tract infection)     Patient Active Problem List   Diagnosis Date Noted  . Major depressive disorder, recurrent severe without psychotic features (HCC) 09/07/2016  . Anxiety 03/29/2015  . 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 No pertinent surgical history.   OB History    Gravida  2   Para  2   Term  2   Preterm      AB      Living  2     SAB      TAB      Ectopic      Multiple  0   Live Births  2            Home Medications    Prior to Admission medications   Medication Sig Start Date End Date Taking? Authorizing Provider  acetaminophen (TYLENOL) 500 MG tablet Take 1,000 mg by mouth every 6 (six) hours as needed for mild pain, moderate pain, fever or headache.     [provider]  neomycin-polymyxin-hydrocortisone (CORTISPORIN) 3.5-10000-1 OTIC suspension Place 4 drops into both ears 3 (three) times daily. 03/27/17   Elvina SidleLauenstein, Kurt, MD  norethindrone (ORTHO MICRONOR) 0.35 MG tablet Take 1 tablet (0.35 mg total) by mouth daily. 11/09/16   Lennox SoldersWinfrey, Amanda C, MD  Prenat-FeAsp-Meth-FA-DHA w/o A (PRENATE PIXIE) 10-0.6-0.4-200 MG CAPS Take 1 tablet by mouth daily. 07/01/17   Orvilla Cornwallenney, Rachelle A, CNM  sertraline (ZOLOFT) 100 MG tablet Take 1 tablet (100 mg total) by mouth at bedtime.  12/15/16   Constant, Peggy, MD    Family History Family History  Problem Relation Age of Onset  . Hypertension Mother   . Hypertension Maternal Grandmother   . Diabetes Maternal Grandmother   . Congestive Heart Failure Maternal Grandmother   . Hypertension Maternal Uncle   . Diabetes Maternal Uncle   . Hypertension Maternal Uncle   . Diabetes Maternal Uncle     Social History Social History   Tobacco Use  . Smoking status: Never Smoker  . Smokeless tobacco: Never Used  Substance Use Topics  . Alcohol use: No  . Drug use: No     Allergies   Cefzil [cefprozil] and Rocephin [ceftriaxone sodium in dextrose]   Review of Systems Review of Systems Ten systems reviewed and are negative for acute change, except as noted in the HPI.    Physical Exam Updated Vital Signs BP 113/79   Pulse 89   Temp 98.3 F (36.8 C) (Oral)   Resp 17   Ht 5\' 10"  (1.778 m)   Wt 86.2 kg (190 lb)   LMP 07/13/2017 (Exact Date)   SpO2 99%   BMI 27.26 kg/m   Physical Exam  Constitutional: She is oriented to person, place, and time. She appears well-developed  and well-nourished. She appears ill.  HENT:  Head: Normocephalic.  Right Ear: Tympanic membrane normal.  Left Ear: Tympanic membrane normal.  Mouth/Throat: Uvula is midline and oropharynx is clear and moist. No oropharyngeal exudate. No tonsillar exudate.  3 small shallow ulcerations superior to the UVULA Uvula is midline. No exudates or tonsilar hypertrophy  Neck: Thyromegaly present.  Cardiovascular: Normal rate.  Pulmonary/Chest: Effort normal and breath sounds normal.  Abdominal: Soft. Bowel sounds are normal.  Musculoskeletal: Normal range of motion.  Neurological: She is alert and oriented to person, place, and time.  Skin: Skin is warm.  Nursing note and vitals reviewed.    ED Treatments / Results  Labs (all labs ordered are listed, but only abnormal results are displayed) Labs Reviewed  GROUP A STREP BY PCR  TSH    T3, FREE  T4, FREE  I-STAT BETA HCG BLOOD, ED ([facility], [facility], [facility] ONLY)  I-STAT CHEM 8, ED    EKG None  Radiology No results found.  Procedures Procedures (including critical care time)  Medications Ordered in ED Medications - No data to display   Initial Impression / Assessment and Plan / ED Course  I have reviewed the triage vital signs and the nursing notes.  Pertinent labs & imaging results that were available during my care of the patient were reviewed by me and considered in my medical decision making (see chart for details).     Patient presentation consistent with herpangina.  She has obvious thyromegaly on her exam however TSH is normal.  She is advised to follow-up with her primary care physician for further evaluation.  Patient will follow-up with her PCP.  Return precautions Final Clinical Impressions(s) / ED Diagnoses   Final diagnoses:  Acute pharyngitis, unspecified etiology  Thyromegaly    ED Discharge Orders    None       Arthor Captain, PA-C 08/21/17 1726    Jacalyn Lefevre, MD 08/22/17 1525

## 2017-08-22 LAB — T3, FREE: T3 FREE: 3.1 pg/mL (ref 2.0–4.4)

## 2017-11-16 ENCOUNTER — Other Ambulatory Visit: Payer: Self-pay | Admitting: Family Medicine

## 2017-11-21 DIAGNOSIS — R87610 Atypical squamous cells of undetermined significance on cytologic smear of cervix (ASC-US): Secondary | ICD-10-CM | POA: Diagnosis not present

## 2017-11-21 DIAGNOSIS — Z01419 Encounter for gynecological examination (general) (routine) without abnormal findings: Secondary | ICD-10-CM | POA: Diagnosis not present

## 2017-11-21 DIAGNOSIS — Z124 Encounter for screening for malignant neoplasm of cervix: Secondary | ICD-10-CM | POA: Diagnosis not present

## 2017-11-21 DIAGNOSIS — Z6829 Body mass index (BMI) 29.0-29.9, adult: Secondary | ICD-10-CM | POA: Diagnosis not present

## 2017-11-21 DIAGNOSIS — E559 Vitamin D deficiency, unspecified: Secondary | ICD-10-CM | POA: Diagnosis not present

## 2017-11-21 DIAGNOSIS — D72819 Decreased white blood cell count, unspecified: Secondary | ICD-10-CM | POA: Diagnosis not present

## 2017-11-24 ENCOUNTER — Other Ambulatory Visit: Payer: Self-pay | Admitting: Obstetrics and Gynecology

## 2017-11-24 DIAGNOSIS — E049 Nontoxic goiter, unspecified: Secondary | ICD-10-CM

## 2017-12-06 ENCOUNTER — Other Ambulatory Visit: Payer: Self-pay

## 2017-12-06 ENCOUNTER — Ambulatory Visit
Admission: RE | Admit: 2017-12-06 | Discharge: 2017-12-06 | Disposition: A | Discharge status: Home / Self Care | Payer: BLUE CROSS/BLUE SHIELD | Source: Ambulatory Visit | Attending: Obstetrics and Gynecology | Admitting: Obstetrics and Gynecology

## 2017-12-06 DIAGNOSIS — E041 Nontoxic single thyroid nodule: Secondary | ICD-10-CM | POA: Diagnosis not present

## 2017-12-06 DIAGNOSIS — E049 Nontoxic goiter, unspecified: Secondary | ICD-10-CM

## 2017-12-12 DIAGNOSIS — Z6828 Body mass index (BMI) 28.0-28.9, adult: Secondary | ICD-10-CM | POA: Diagnosis not present

## 2017-12-12 DIAGNOSIS — J029 Acute pharyngitis, unspecified: Secondary | ICD-10-CM | POA: Diagnosis not present

## 2018-02-16 DIAGNOSIS — R5383 Other fatigue: Secondary | ICD-10-CM | POA: Diagnosis not present

## 2018-02-16 DIAGNOSIS — Z309 Encounter for contraceptive management, unspecified: Secondary | ICD-10-CM | POA: Diagnosis not present

## 2018-02-16 DIAGNOSIS — N915 Oligomenorrhea, unspecified: Secondary | ICD-10-CM | POA: Diagnosis not present

## 2018-02-16 DIAGNOSIS — E559 Vitamin D deficiency, unspecified: Secondary | ICD-10-CM | POA: Diagnosis not present

## 2018-04-21 IMAGING — US US MFM OB TRANSVAGINAL
1 series · 7 of 7 positions shown · non-contrast
Comparison: none

[Series 1: us mfm ob transvaginal · 7 acquisitions, 7 frames shown]
[im 1/7]
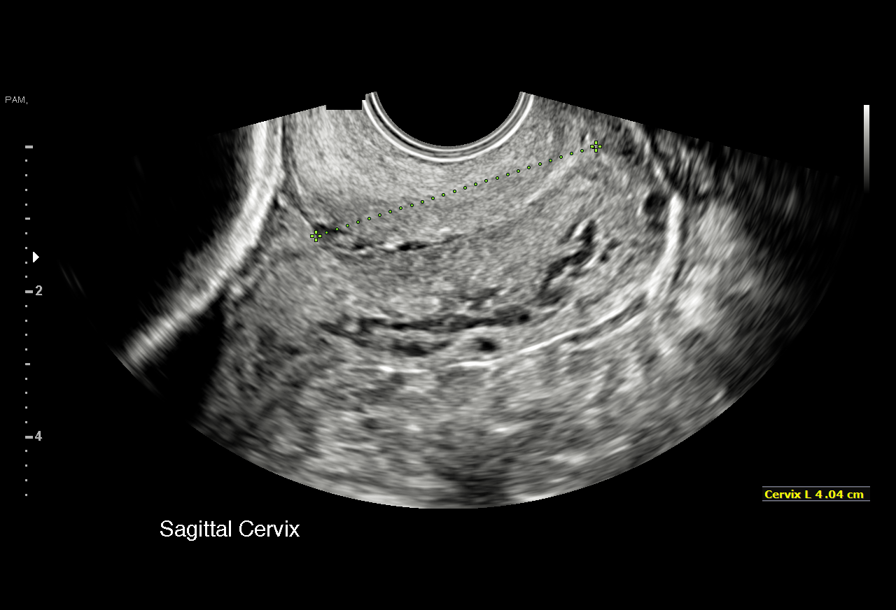
[im 2/7]
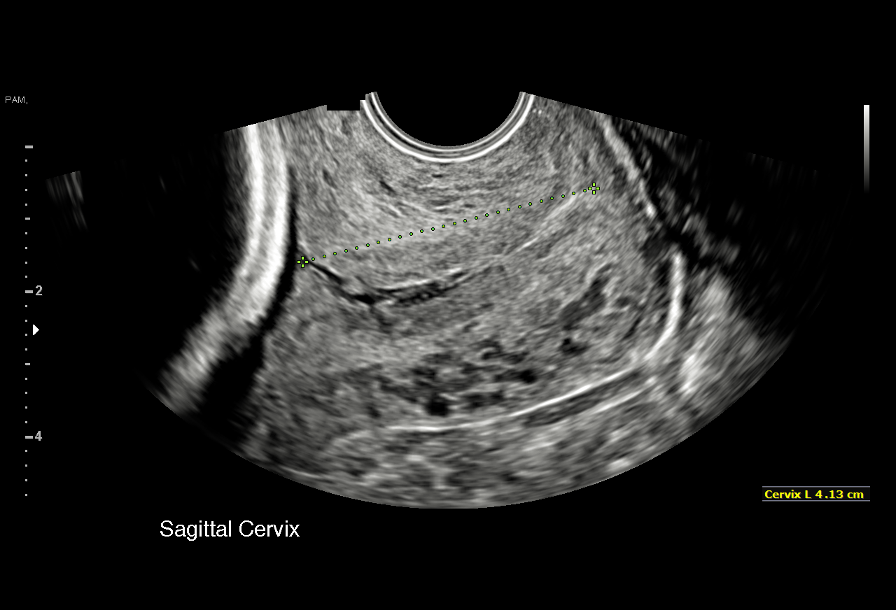
[im 3/7]
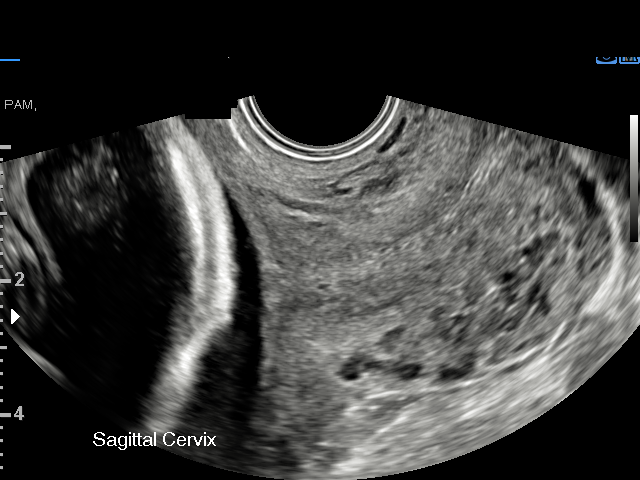
[im 4/7]
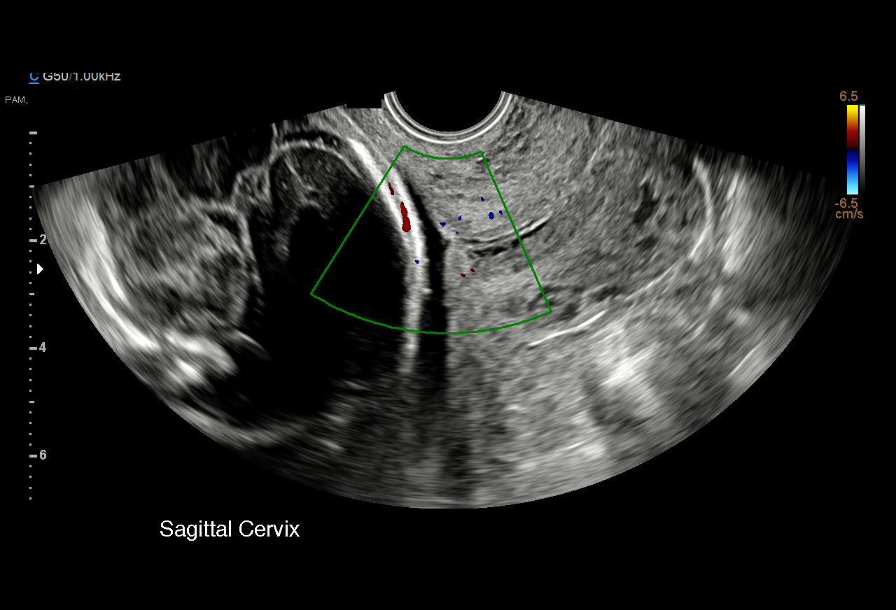
[im 5/7]
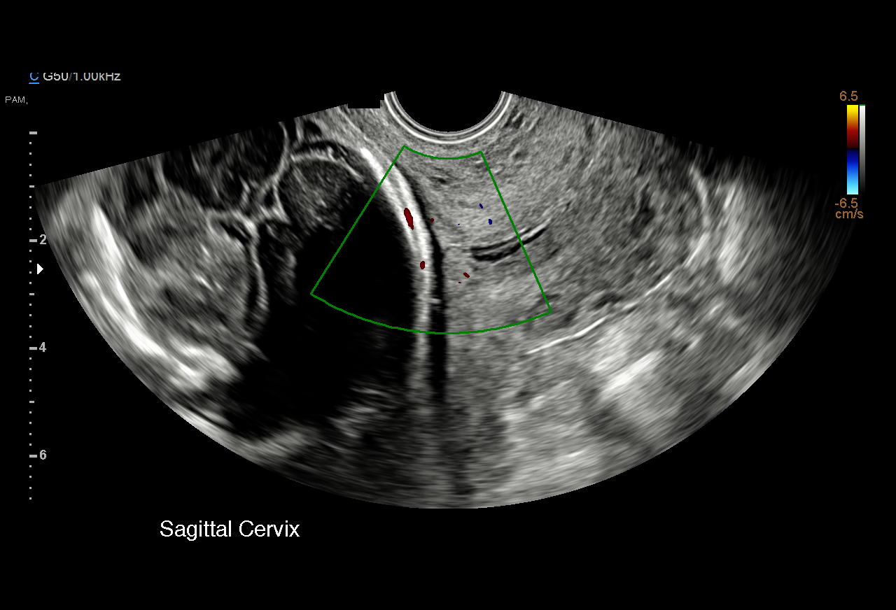
[im 6/7]
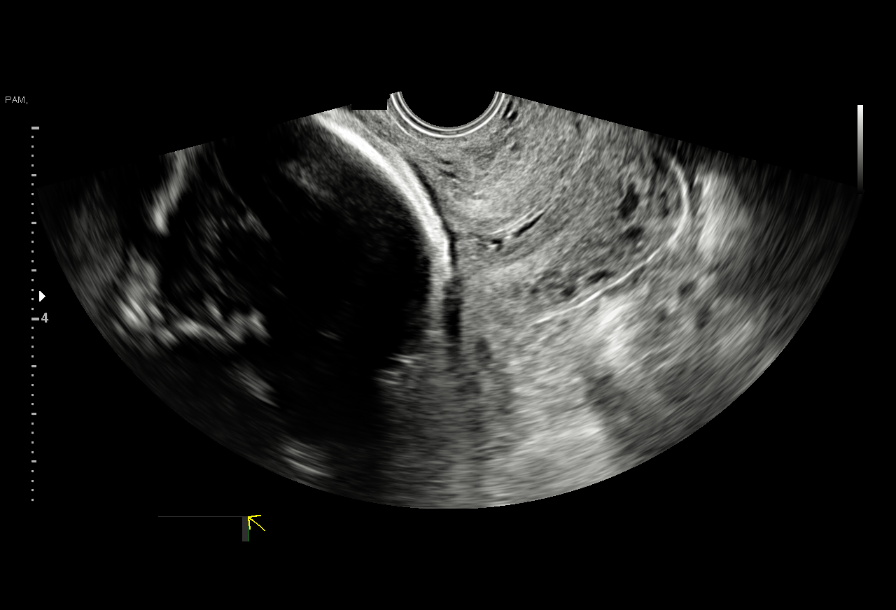
[im 7/7]
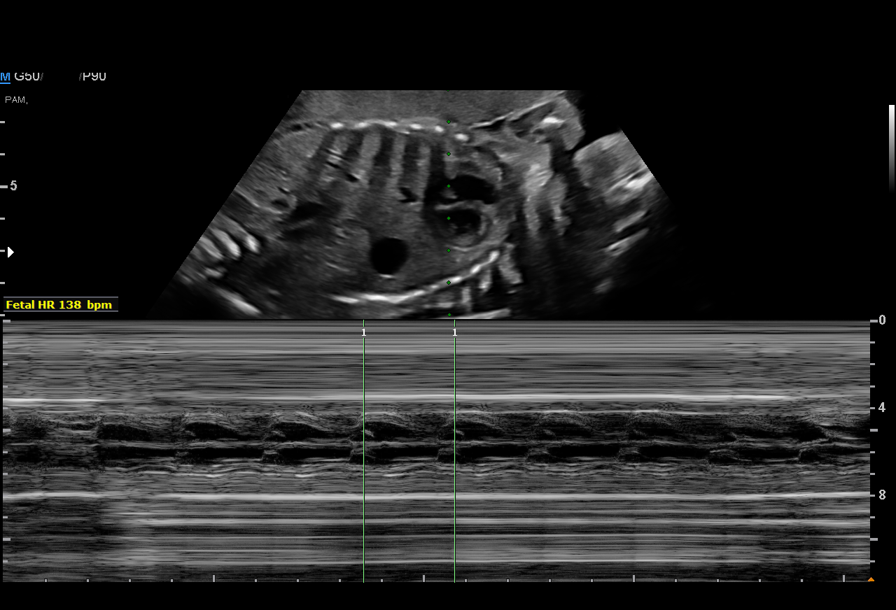

[7 of 7 positions shown; findings below may reference images not displayed]

1  YAKIRA CHRISTMAS          [id number]      [id number]     [id number]
Indications

25 weeks gestation of pregnancy
Vaginal discharge during pregnancy in
second trimester
Abdominal pain in pregnancy
OB History

Blood Type:            Height:  5'9"   Weight (lb):  177       BMI:
Gravidity:    2         Term:   1        Prem:   0        SAB:   0
TOP:          0       Ectopic:  0        Living: 1
Fetal Evaluation

Num Of Fetuses:     1
Fetal Heart         138
Rate(bpm):
Cardiac Activity:   Observed
Presentation:       Cephalic
Gestational Age

LMP:           28w 1d        Date:  01/24/16                 EDD:   10/30/16
Best:          25w 6d     Det. By:  Previous Ultrasound      EDD:   11/15/16
(04/16/16)
Cervix Uterus Adnexa

Cervix
Length:            4.1  cm.
Normal appearance by transvaginal scan
Impression

SIUP at 25+6 weeks
Normal amniotic fluid volume
EV views of cervix: normal length without funneling
Recommendations

Follow-up as clinically indicated

## 2018-07-28 DIAGNOSIS — J020 Streptococcal pharyngitis: Secondary | ICD-10-CM | POA: Diagnosis not present

## 2018-08-23 DIAGNOSIS — J020 Streptococcal pharyngitis: Secondary | ICD-10-CM | POA: Diagnosis not present

## 2018-08-23 DIAGNOSIS — R51 Headache: Secondary | ICD-10-CM | POA: Diagnosis not present

## 2018-08-23 DIAGNOSIS — Z6828 Body mass index (BMI) 28.0-28.9, adult: Secondary | ICD-10-CM | POA: Diagnosis not present

## 2018-09-14 DIAGNOSIS — Z6827 Body mass index (BMI) 27.0-27.9, adult: Secondary | ICD-10-CM | POA: Diagnosis not present

## 2018-09-14 DIAGNOSIS — J020 Streptococcal pharyngitis: Secondary | ICD-10-CM | POA: Diagnosis not present

## 2018-10-10 DIAGNOSIS — J020 Streptococcal pharyngitis: Secondary | ICD-10-CM | POA: Diagnosis not present

## 2018-10-13 DIAGNOSIS — J3503 Chronic tonsillitis and adenoiditis: Secondary | ICD-10-CM | POA: Diagnosis not present

## 2018-10-13 DIAGNOSIS — J353 Hypertrophy of tonsils with hypertrophy of adenoids: Secondary | ICD-10-CM | POA: Diagnosis not present

## 2018-11-30 IMAGING — US US THYROID
1 series · 13 of 25 positions shown · non-contrast
Comparison: None.

CLINICAL DATA: Palpable abnormality. Goiter on physical
examination.

EXAM:
THYROID ULTRASOUND
TECHNIQUE: Ultrasound examination of the thyroid gland and adjacent soft
tissues was performed.

[Series 1: us thyroid · 0.07mm/px · 13 of 35 slices shown]
[im 1/35]
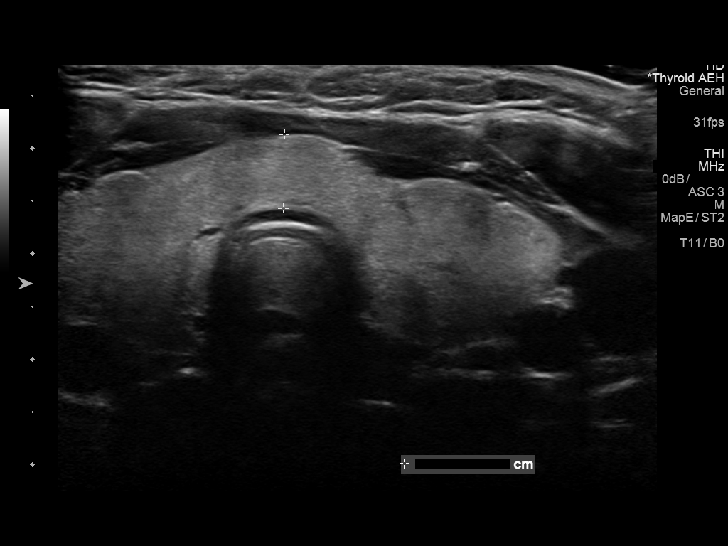
[im 3/35]
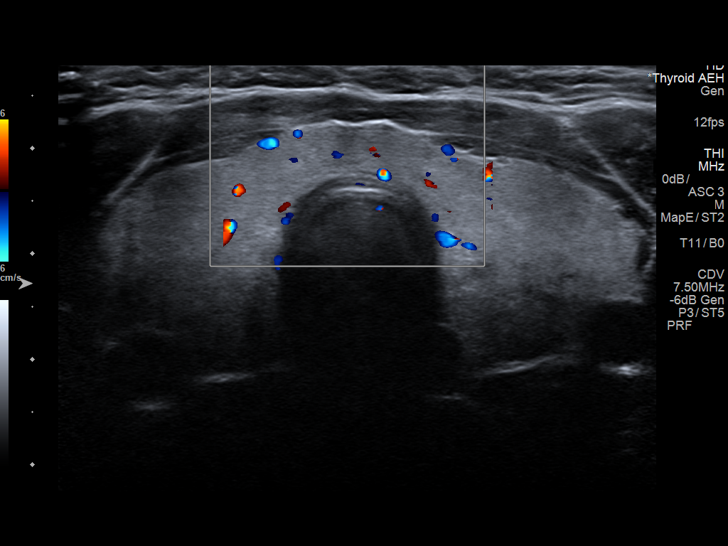
[im 6/35]
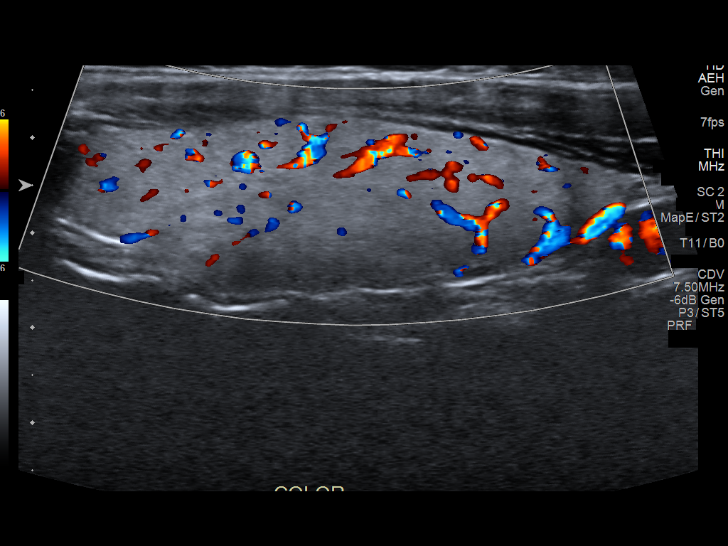
[im 9/35]
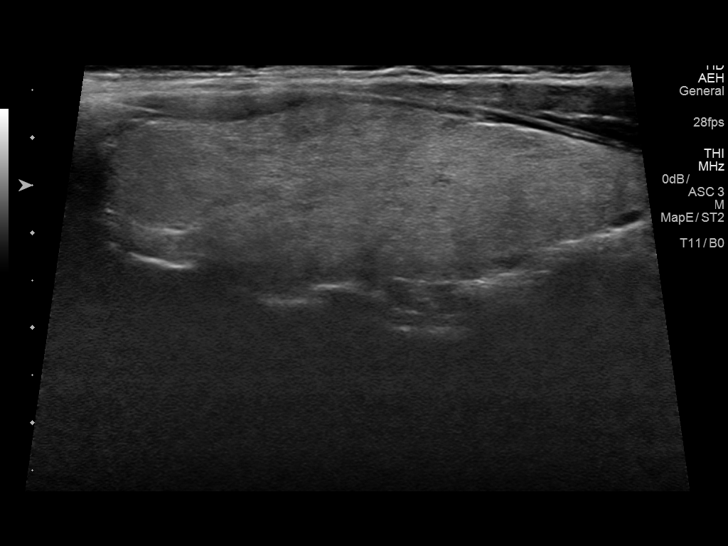
[im 12/35]
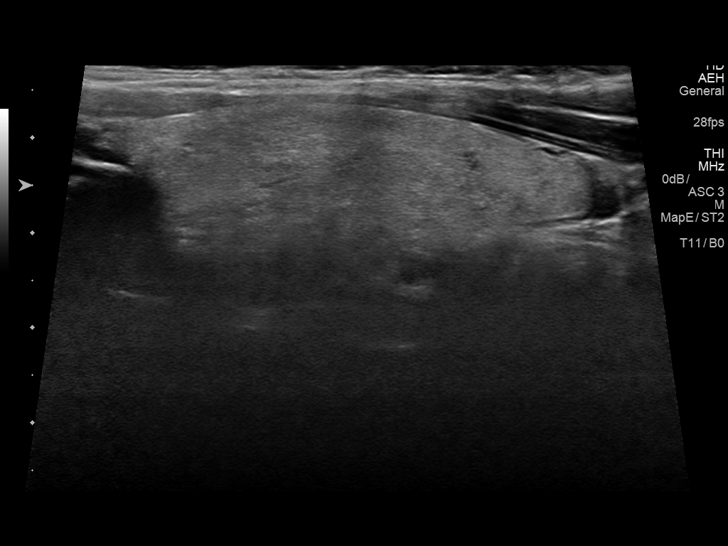
[im 15/35]
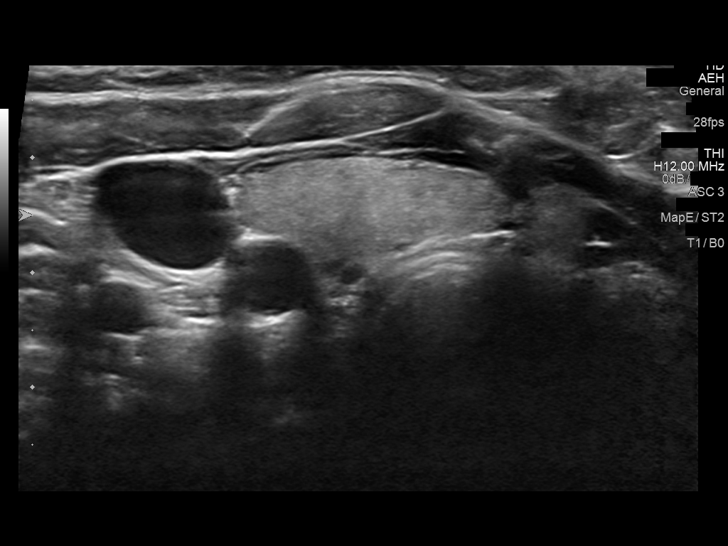
[im 18/35]
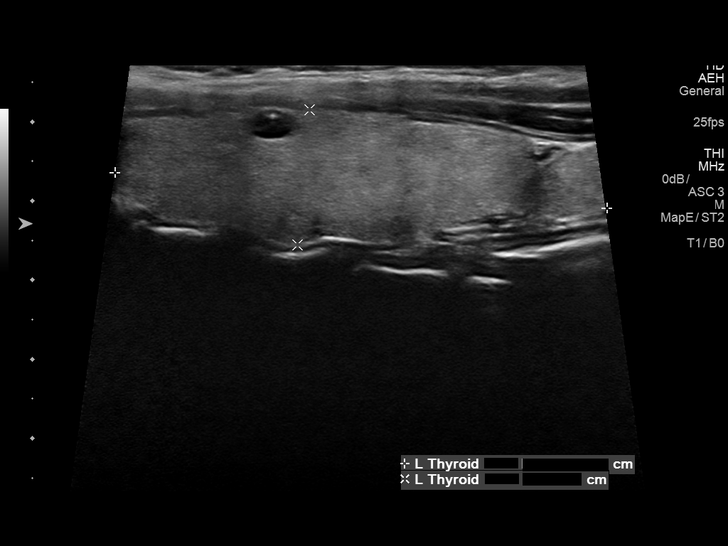
[im 20/35]
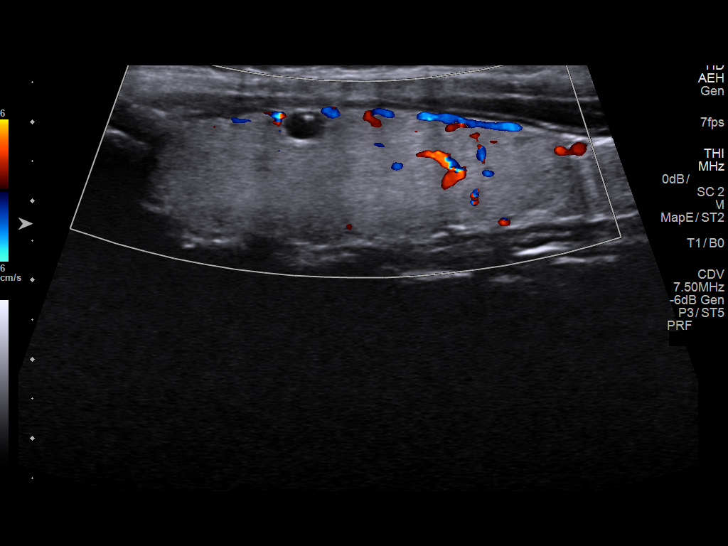
[im 23/35]
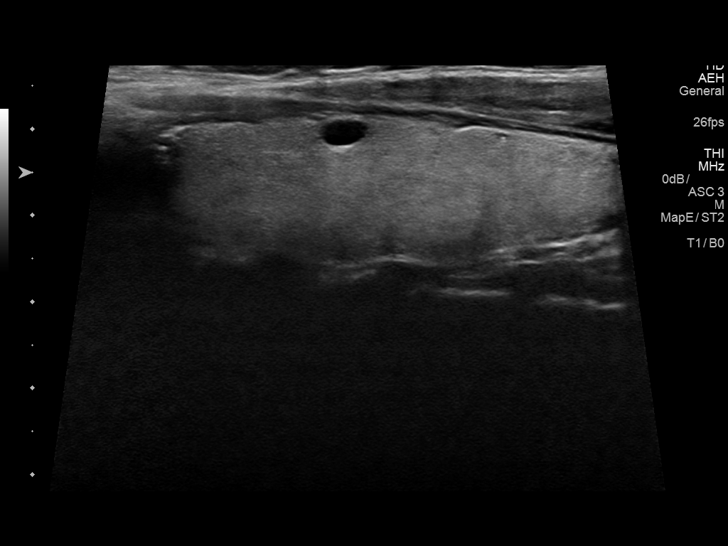
[im 26/35]
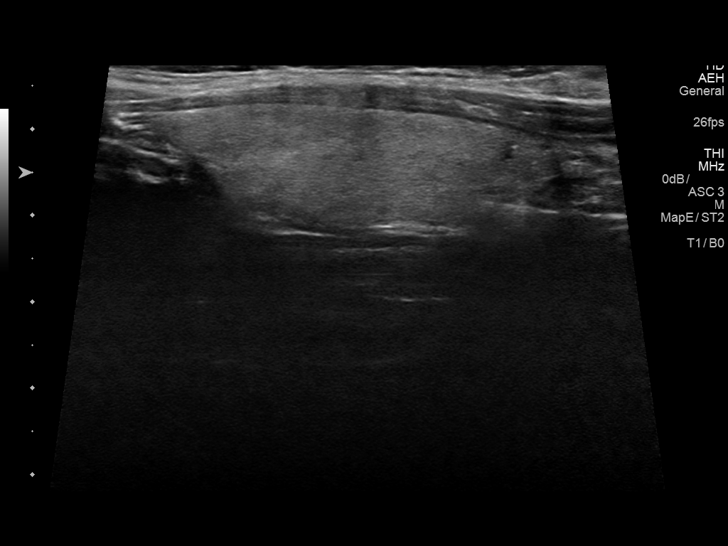
[im 29/35]
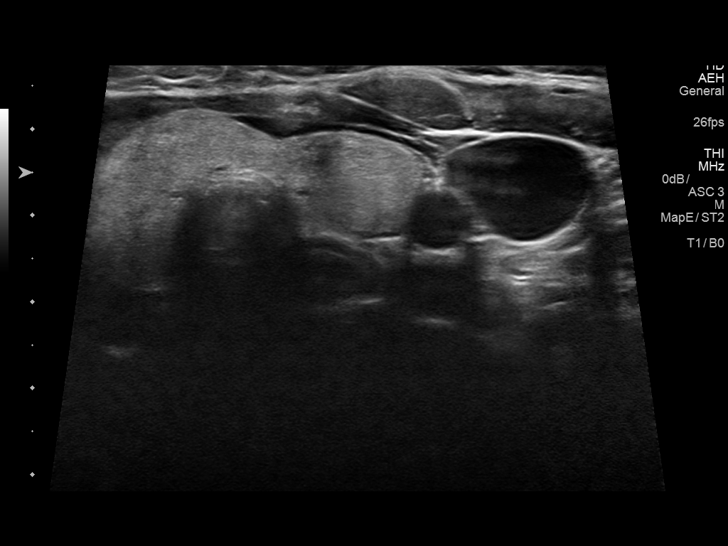
[im 32/35]
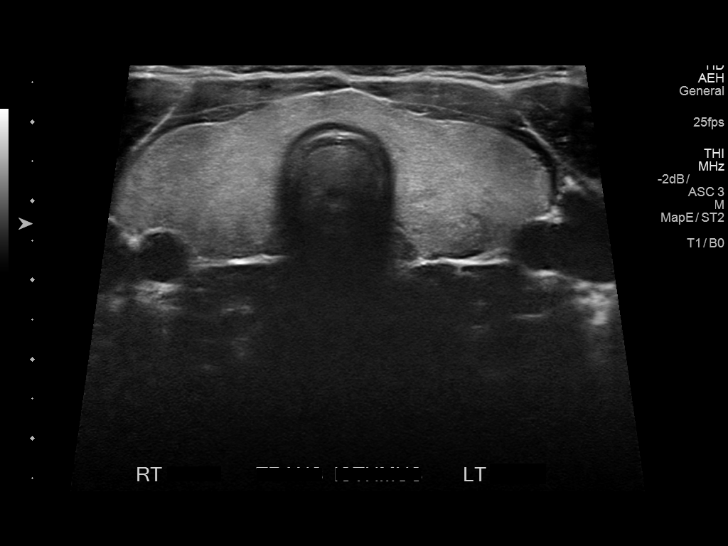
[im 35/35]
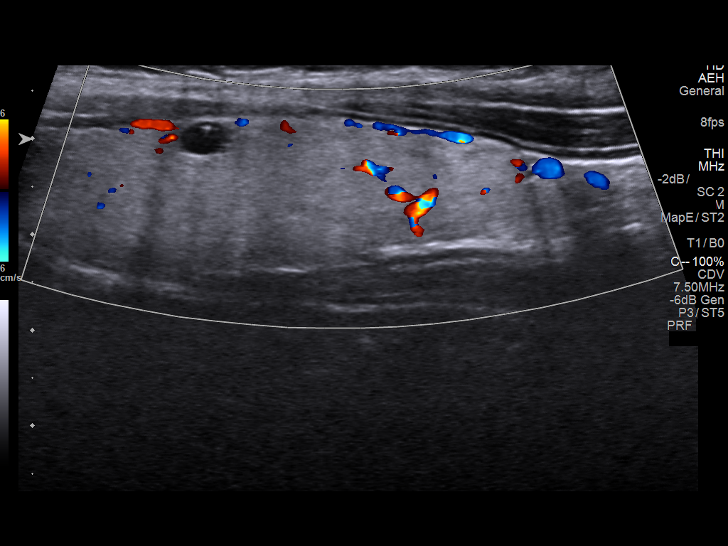

[13 of 25 positions shown; findings below may reference images not displayed]

FINDINGS: Parenchymal Echotexture: Normal

Isthmus: Normal in size measures 0.7 cm in diameter

Right lobe: Borderline enlarged measuring 5.8 x 2.0 x 2.1 cm

Left lobe: Enlarged measuring 6.2 x 1.7 x 2.1 cm

_________________________________________________________

Estimated total number of nodules >/= 1 cm: 0

Number of spongiform nodules >/=  2 cm not described below (TR1): 0

Number of mixed cystic and solid nodules >/= 1.5 cm not described
below (TR2): 0

_________________________________________________________

Note is made of a approximately 0.6 cm anechoic cyst within the
anterior mid aspect the right lobe of the thyroid which contains an
internal echogenic foci with ring down artifact compatible with
benign colloid. This benign colloid containing cyst does not meet
imaging criteria to recommend percutaneous sampling or continued
dedicated follow-up.
IMPRESSION: Borderline enlarged though otherwise normal-appearing thyroid gland
without discrete worrisome thyroid nodule or mass.

The above is in keeping with the ACR TI-RADS recommendations - [HOSPITAL] 7812;[DATE].

## 2019-01-24 ENCOUNTER — Ambulatory Visit
Admission: EM | Admit: 2019-01-24 | Discharge: 2019-01-24 | Disposition: A | Discharge status: Home / Self Care | Payer: BLUE CROSS/BLUE SHIELD | Attending: Emergency Medicine | Admitting: Emergency Medicine

## 2019-01-24 ENCOUNTER — Other Ambulatory Visit: Payer: Self-pay

## 2019-01-24 DIAGNOSIS — S31831A Laceration without foreign body of anus, initial encounter: Secondary | ICD-10-CM | POA: Diagnosis not present

## 2019-01-24 DIAGNOSIS — S3994XA Unspecified injury of external genitals, initial encounter: Secondary | ICD-10-CM

## 2019-01-24 MED ORDER — TRAMADOL HCL 50 MG PO TABS: 50.0000 mg | ORAL_TABLET | Freq: Two times a day (BID) | ORAL | 0 refills | Status: DC | PRN

## 2019-01-24 MED ORDER — POLYETHYLENE GLYCOL 3350 17 G PO PACK: 17.0000 g | PACK | Freq: Every day | ORAL | 0 refills | Status: DC

## 2019-01-24 MED ORDER — NIFEDIPINE POWD: 0 refills | Status: DC

## 2019-01-24 MED ORDER — IBUPROFEN 800 MG PO TABS: 800.0000 mg | ORAL_TABLET | Freq: Three times a day (TID) | ORAL | 0 refills | Status: DC

## 2019-01-24 NOTE — ED Provider Notes (Signed)
[facility]   [id number] 01/24/19 Arrival Time: 1927  CC: Rectal pain  SUBJECTIVE:  Diane [name] is a 23 y.o. female who presents with complaint of rectal pain x 3 days.  Admits to anal sex.  Describes as intermittent and stabbing in character.  Has tried OTC medications without relief.  Worse with movement and having a BM.  Reports hx of perineum tears with vaginal deliveries.    Denies fever, chills, nausea, vomiting, chest pain, SOB, diarrhea, constipation, hematochezia, melena, dysuria, difficulty urinating, increased frequency or urgency, flank pain, loss of bowel or bladder function, vaginal discharge, vaginal odor, vaginal bleeding, dyspareunia, pelvic pain.     Patient's last menstrual period was 01/07/2019.  ROS: As per HPI.  All other pertinent ROS negative.     Past Medical History:  Diagnosis Date  . Anemia   . Anxiety   . Headache   . UTI (lower urinary tract infection)    History reviewed. No pertinent surgical history. Allergies  Allergen Reactions  . Cefzil [Cefprozil] Hives  . Rocephin [Ceftriaxone Sodium In Dextrose] Hives   No current facility-administered medications on file prior to encounter.    Current Outpatient Medications on File Prior to Encounter  Medication Sig Dispense Refill  . acetaminophen (TYLENOL) 500 MG tablet Take 1,000 mg by mouth every 6 (six) hours as needed for mild pain, moderate pain, fever or headache.     . [DISCONTINUED] norethindrone (ORTHO MICRONOR) 0.35 MG tablet Take 1 tablet (0.35 mg total) by mouth daily. 1 Package 11  . [DISCONTINUED] sertraline (ZOLOFT) 100 MG tablet Take 1 tablet (100 mg total) by mouth at bedtime. 30 tablet 3   Social History   Socioeconomic History  . Marital status: Single    Spouse name: Not on file  . Number of children: Not on file  . Years of education: Not on file  . Highest education level: Not on file  Occupational History  . Not on file  Social Needs  . Financial resource  strain: Not on file  . Food insecurity    Worry: Not on file    Inability: Not on file  . Transportation needs    Medical: Not on file    Non-medical: Not on file  Tobacco Use  . Smoking status: Never Smoker  . Smokeless tobacco: Never Used  Substance and Sexual Activity  . Alcohol use: No  . Drug use: No  . Sexual activity: Yes    Partners: Female    Birth control/protection: None  Lifestyle  . Physical activity    Days per week: Not on file    Minutes per session: Not on file  . Stress: Not on file  Relationships  . Social Musician on phone: Not on file    Gets together: Not on file    Attends religious service: Not on file    Active member of club or organization: Not on file    Attends meetings of clubs or organizations: Not on file    Relationship status: Not on file  . Intimate partner violence    Fear of current or ex partner: Not on file    Emotionally abused: Not on file    Physically abused: Not on file    Forced sexual activity: Not on file  Other Topics Concern  . Not on file  Social History Narrative  . Not on file   Family History  Problem Relation Age of Onset  . Hypertension Mother   .  Hypertension Maternal Grandmother   . Diabetes Maternal Grandmother   . Congestive Heart Failure Maternal Grandmother   . Hypertension Maternal Uncle   . Diabetes Maternal Uncle   . Hypertension Maternal Uncle   . Diabetes Maternal Uncle      OBJECTIVE:  Vitals:   01/24/19 1934  BP: 130/81  Pulse: (!) 111  Resp: 20  Temp: 98.8 F (37.1 C)  SpO2: 95%    General appearance: Alert; NAD HEENT: NCAT.  Oropharynx clear.  Lungs: clear to auscultation bilaterally without adventitious breath sounds Heart: regular rate and rhythm.  Abdomen: soft, non-distended; normal active bowel sounds; non-tender to light and deep palpation; nontender at McBurney's point; negative Murphy's sign; no guarding Back: no CVA tenderness Rectal: External exam positive  for hemorrhoid skin tag in 6 o'clock position, no obvious masses; approximately 2-3 cm perineum tear and two 1/2 cm distal gluteal cleft tears just superior to the anus, no active bleeding, scant yellow discharge present; Internal exam: uncomfortable; hemoccult negative   Extremities: no edema; symmetrical with no gross deformities Skin: warm and dry Neurologic: normal gait Psychological: alert and cooperative; normal mood and affect  LABS:  Hemoccult negative  ASSESSMENT & PLAN:  1. Anal tear   2. Injury of perineum, initial encounter     Meds ordered this encounter  Medications  . ibuprofen (ADVIL) 800 MG tablet    Sig: Take 1 tablet (800 mg total) by mouth 3 (three) times daily.    Dispense:  30 tablet    Refill:  0    Order Specific Question:   Supervising Provider    Answer:   Raylene Everts [[id number]]  . traMADol (ULTRAM) 50 MG tablet    Sig: Take 1 tablet (50 mg total) by mouth every 12 (twelve) hours as needed.    Dispense:  10 tablet    Refill:  0    Order Specific Question:   Supervising Provider    Answer:   Raylene Everts [[id number]]  . NIFEdipine POWD    Sig: Please compound into 0.2% ointment.  Apply to affected region 2-4 daily as needed for up to 4 weeks.    Dispense:  100 g    Refill:  0    Order Specific Question:   Supervising Provider    Answer:   Raylene Everts [[id number]]  . polyethylene glycol (MIRALAX / GLYCOLAX) 17 g packet    Sig: Take 17 g by mouth daily.    Dispense:  14 each    Refill:  0    Order Specific Question:   Supervising Provider    Answer:   Raylene Everts [[id number]]    Perform sitz water baths Apply cold or warm compresses as needed Ibuprofen 800 mg prescribed.  Take as directed for pain Tramadol for severe break-through pain.  DO NOT TAKE prior to driving or operating heavy machinery Miralax prescribed use as needed for constipation/ hard stools Nifedipine ointment prescribed.  Use as directed for pain Follow up with  PCP and/or general surgeon ASAP for recheck Return or go to the ED if you have any new or worsening symptoms such as fever, chills, nausea, vomiting, abdominal pain, blood in stool or urine, etc...  Reviewed expectations re: course of current medical issues. Questions answered. Outlined signs and symptoms indicating need for more acute intervention. Patient verbalized understanding. After Visit Summary given.   Lestine Box, PA-C 01/24/19 2021

## 2019-01-24 NOTE — Discharge Instructions (Addendum)
Perform sitz water baths Apply cold or warm compresses as needed Ibuprofen 800 mg prescribed.  Take as directed for pain Tramadol for severe break-through pain.  DO NOT TAKE prior to driving or operating heavy machinery Miralax prescribed use as needed for constipation/ hard stools Nifedipine ointment prescribed.  Use as directed for pain Follow up with PCP and/or general surgeon ASAP for recheck Return or go to the ED if you have any new or worsening symptoms such as fever, chills, nausea, vomiting, abdominal pain, blood in stool or urine, etc..Marland Kitchen

## 2019-01-24 NOTE — ED Triage Notes (Signed)
Pt states that she had anal sex on Sunday and feels as if she tore and pain is becoming worse

## 2019-01-27 DIAGNOSIS — L0390 Cellulitis, unspecified: Secondary | ICD-10-CM | POA: Diagnosis not present

## 2019-01-27 DIAGNOSIS — Z6828 Body mass index (BMI) 28.0-28.9, adult: Secondary | ICD-10-CM | POA: Diagnosis not present

## 2019-01-27 DIAGNOSIS — R300 Dysuria: Secondary | ICD-10-CM | POA: Diagnosis not present

## 2019-03-05 DIAGNOSIS — Z113 Encounter for screening for infections with a predominantly sexual mode of transmission: Secondary | ICD-10-CM | POA: Diagnosis not present

## 2019-03-05 DIAGNOSIS — Z01419 Encounter for gynecological examination (general) (routine) without abnormal findings: Secondary | ICD-10-CM | POA: Diagnosis not present

## 2019-03-05 DIAGNOSIS — N898 Other specified noninflammatory disorders of vagina: Secondary | ICD-10-CM | POA: Diagnosis not present

## 2019-03-05 DIAGNOSIS — Z139 Encounter for screening, unspecified: Secondary | ICD-10-CM | POA: Diagnosis not present

## 2019-03-05 DIAGNOSIS — R87610 Atypical squamous cells of undetermined significance on cytologic smear of cervix (ASC-US): Secondary | ICD-10-CM | POA: Diagnosis not present

## 2019-03-05 DIAGNOSIS — Z3040 Encounter for surveillance of contraceptives, unspecified: Secondary | ICD-10-CM | POA: Diagnosis not present

## 2019-03-15 DIAGNOSIS — S338XXA Sprain of other parts of lumbar spine and pelvis, initial encounter: Secondary | ICD-10-CM | POA: Diagnosis not present

## 2019-03-15 DIAGNOSIS — S134XXA Sprain of ligaments of cervical spine, initial encounter: Secondary | ICD-10-CM | POA: Diagnosis not present

## 2019-03-15 DIAGNOSIS — S233XXA Sprain of ligaments of thoracic spine, initial encounter: Secondary | ICD-10-CM | POA: Diagnosis not present

## 2019-03-19 DIAGNOSIS — S134XXA Sprain of ligaments of cervical spine, initial encounter: Secondary | ICD-10-CM | POA: Diagnosis not present

## 2019-03-19 DIAGNOSIS — S233XXA Sprain of ligaments of thoracic spine, initial encounter: Secondary | ICD-10-CM | POA: Diagnosis not present

## 2019-03-19 DIAGNOSIS — S338XXA Sprain of other parts of lumbar spine and pelvis, initial encounter: Secondary | ICD-10-CM | POA: Diagnosis not present

## 2019-03-22 DIAGNOSIS — S338XXA Sprain of other parts of lumbar spine and pelvis, initial encounter: Secondary | ICD-10-CM | POA: Diagnosis not present

## 2019-03-22 DIAGNOSIS — S134XXA Sprain of ligaments of cervical spine, initial encounter: Secondary | ICD-10-CM | POA: Diagnosis not present

## 2019-03-22 DIAGNOSIS — S233XXA Sprain of ligaments of thoracic spine, initial encounter: Secondary | ICD-10-CM | POA: Diagnosis not present

## 2019-03-28 DIAGNOSIS — S233XXA Sprain of ligaments of thoracic spine, initial encounter: Secondary | ICD-10-CM | POA: Diagnosis not present

## 2019-03-28 DIAGNOSIS — S338XXA Sprain of other parts of lumbar spine and pelvis, initial encounter: Secondary | ICD-10-CM | POA: Diagnosis not present

## 2019-03-28 DIAGNOSIS — S134XXA Sprain of ligaments of cervical spine, initial encounter: Secondary | ICD-10-CM | POA: Diagnosis not present

## 2019-04-04 DIAGNOSIS — S338XXA Sprain of other parts of lumbar spine and pelvis, initial encounter: Secondary | ICD-10-CM | POA: Diagnosis not present

## 2019-04-04 DIAGNOSIS — S233XXA Sprain of ligaments of thoracic spine, initial encounter: Secondary | ICD-10-CM | POA: Diagnosis not present

## 2019-04-04 DIAGNOSIS — S134XXA Sprain of ligaments of cervical spine, initial encounter: Secondary | ICD-10-CM | POA: Diagnosis not present

## 2019-04-12 DIAGNOSIS — S338XXA Sprain of other parts of lumbar spine and pelvis, initial encounter: Secondary | ICD-10-CM | POA: Diagnosis not present

## 2019-04-12 DIAGNOSIS — S134XXA Sprain of ligaments of cervical spine, initial encounter: Secondary | ICD-10-CM | POA: Diagnosis not present

## 2019-04-12 DIAGNOSIS — S233XXA Sprain of ligaments of thoracic spine, initial encounter: Secondary | ICD-10-CM | POA: Diagnosis not present

## 2019-04-24 DIAGNOSIS — S233XXA Sprain of ligaments of thoracic spine, initial encounter: Secondary | ICD-10-CM | POA: Diagnosis not present

## 2019-04-24 DIAGNOSIS — S338XXA Sprain of other parts of lumbar spine and pelvis, initial encounter: Secondary | ICD-10-CM | POA: Diagnosis not present

## 2019-04-24 DIAGNOSIS — S134XXA Sprain of ligaments of cervical spine, initial encounter: Secondary | ICD-10-CM | POA: Diagnosis not present

## 2019-05-01 DIAGNOSIS — S233XXA Sprain of ligaments of thoracic spine, initial encounter: Secondary | ICD-10-CM | POA: Diagnosis not present

## 2019-05-01 DIAGNOSIS — S134XXA Sprain of ligaments of cervical spine, initial encounter: Secondary | ICD-10-CM | POA: Diagnosis not present

## 2019-05-01 DIAGNOSIS — S338XXA Sprain of other parts of lumbar spine and pelvis, initial encounter: Secondary | ICD-10-CM | POA: Diagnosis not present

## 2019-05-15 DIAGNOSIS — S134XXA Sprain of ligaments of cervical spine, initial encounter: Secondary | ICD-10-CM | POA: Diagnosis not present

## 2019-05-15 DIAGNOSIS — S338XXA Sprain of other parts of lumbar spine and pelvis, initial encounter: Secondary | ICD-10-CM | POA: Diagnosis not present

## 2019-05-15 DIAGNOSIS — S233XXA Sprain of ligaments of thoracic spine, initial encounter: Secondary | ICD-10-CM | POA: Diagnosis not present

## 2020-02-23 ENCOUNTER — Other Ambulatory Visit: Payer: Self-pay

## 2020-02-23 ENCOUNTER — Encounter (HOSPITAL_COMMUNITY): Payer: Self-pay | Admitting: *Deleted

## 2020-02-23 DIAGNOSIS — Z20822 Contact with and (suspected) exposure to covid-19: Secondary | ICD-10-CM | POA: Insufficient documentation

## 2020-02-23 DIAGNOSIS — R059 Cough, unspecified: Secondary | ICD-10-CM | POA: Diagnosis present

## 2020-02-23 DIAGNOSIS — J069 Acute upper respiratory infection, unspecified: Secondary | ICD-10-CM | POA: Insufficient documentation

## 2020-02-23 NOTE — ED Triage Notes (Signed)
Pt with congestion and cough since Thursday.  Denies any fever or N/V/D.   Pt states possible Covid exposure due to child in school and was notified that a student was positive. Pt's two sons are being seen as well due to cough.

## 2020-02-24 ENCOUNTER — Emergency Department (HOSPITAL_COMMUNITY)
Admission: EM | Admit: 2020-02-24 | Discharge: 2020-02-24 | Disposition: A | Discharge status: Home / Self Care | Payer: Medicaid Other | Attending: Emergency Medicine | Admitting: Emergency Medicine

## 2020-02-24 DIAGNOSIS — J069 Acute upper respiratory infection, unspecified: Secondary | ICD-10-CM

## 2020-02-24 LAB — RESP PANEL BY RT-PCR (FLU A&B, COVID) ARPGX2
Influenza A by PCR: NEGATIVE
Influenza B by PCR: NEGATIVE
SARS Coronavirus 2 by RT PCR: NEGATIVE

## 2020-02-24 NOTE — Discharge Instructions (Addendum)
Drink plenty of fluids.  Take Mucinex DM over-the-counter for cough.  You can take Claritin or Zyrtec over-the-counter once a day for nasal congestion.  Recheck if you get a fever.

## 2020-02-24 NOTE — ED Provider Notes (Signed)
[facility] EMERGENCY DEPARTMENT Provider Note   CSN: [id number] Arrival date & time: 02/23/20  2054   Time seen 4:35 AM  History Chief Complaint  Patient presents with  . Cough    Diane [name] is a 24 y.o. female.  HPI Mother states she has had a cough that sometimes is productive of yellow or green mucus since December 23.  She has not had a fever.  She denies sore throat, nausea, or vomiting.  She has had some rhinorrhea and it is clear and sometimes yellow.  She is here with 2 small children with similar symptoms.  Patient states last year she was getting strep throat every month.  She states she has been told before her tonsils are enlarged.  Patient has not had the Covid vaccine    PCP Selinda Flavin, MD   Past Medical History:  Diagnosis Date  . Anemia   . Anxiety   . Headache   . UTI (lower urinary tract infection)     Patient Active Problem List   Diagnosis Date Noted  . Major depressive disorder, recurrent severe without psychotic features (HCC) 09/07/2016  . Anxiety 03/29/2015  . 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 No pertinent surgical history.   OB History    Gravida  2   Para  2   Term  2   Preterm      AB      Living  2     SAB      IAB      Ectopic      Multiple  0   Live Births  2           Family History  Problem Relation Age of Onset  . Hypertension Mother   . Hypertension Maternal Grandmother   . Diabetes Maternal Grandmother   . Congestive Heart Failure Maternal Grandmother   . Hypertension Maternal Uncle   . Diabetes Maternal Uncle   . Hypertension Maternal Uncle   . Diabetes Maternal Uncle     Social History   Tobacco Use  . Smoking status: Never Smoker  . Smokeless tobacco: Never Used  Vaping Use  . Vaping Use: Never used  Substance Use Topics  . Alcohol use: No  . 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 Authorizing Provider  acetaminophen (TYLENOL) 500 MG tablet Take 1,000 mg by mouth every 6 (six) hours as needed for mild pain, moderate pain, fever or headache.     [provider]  ibuprofen (ADVIL) 800 MG tablet Take 1 tablet (800 mg total) by mouth 3 (three) times daily. 01/24/19   Wurst, [place], PA-C  NIFEdipine POWD Please compound into 0.2% ointment.  Apply to affected region 2-4 daily as needed for up to 4 weeks. 01/24/19   Wurst, [place], PA-C  polyethylene glycol (MIRALAX / GLYCOLAX) 17 g packet Take 17 g by mouth daily. 01/24/19   Wurst, [place], PA-C  traMADol (ULTRAM) 50 MG tablet Take 1 tablet (50 mg total) by mouth every 12 (twelve) hours as needed. 01/24/19   Wurst, [place], PA-C  norethindrone (ORTHO MICRONOR) 0.35 MG tablet Take 1 tablet (0.35 mg total) by mouth daily. 11/09/16 01/24/19  Lennox Solders, MD  sertraline (ZOLOFT) 100 MG tablet Take 1 tablet (100 mg total) by mouth at bedtime. 12/15/16 01/24/19  Constant, Gigi Gin, MD    Allergies  Cefzil [cefprozil] and Rocephin [ceftriaxone sodium in dextrose]  Review of Systems   Review of Systems  All other systems reviewed and are negative.   Physical Exam Updated Vital Signs BP (!) 122/91 (BP Location: Left Arm)   Pulse 100   Temp 98.5 F (36.9 C) (Oral)   Resp 18   Ht 5\' 9"  (1.753 m)   Wt 89.8 kg   SpO2 98%   BMI 29.24 kg/m   Physical Exam Vitals and nursing note reviewed.  Constitutional:      General: She is not in acute distress.    Appearance: Normal appearance.  HENT:     Head: Normocephalic and atraumatic.     Right Ear: External ear normal.     Left Ear: External ear normal.     Nose: Nose normal.     Mouth/Throat:     Comments: Patient has enlarged tonsils however they are not inflamed and there is no exudates.  Uvula is midline.  Speech is normal. Eyes:     Extraocular Movements: Extraocular movements intact.      Conjunctiva/sclera: Conjunctivae normal.     Pupils: Pupils are equal, round, and reactive to light.  Cardiovascular:     Rate and Rhythm: Normal rate and regular rhythm.     Pulses: Normal pulses.     Heart sounds: Normal heart sounds.  Pulmonary:     Effort: Pulmonary effort is normal. No respiratory distress.     Breath sounds: Normal breath sounds. No stridor. No wheezing, rhonchi or rales.  Musculoskeletal:        General: Normal range of motion.     Cervical back: Normal range of motion.  Skin:    General: Skin is warm and dry.  Neurological:     General: No focal deficit present.     Mental Status: She is alert and oriented to person, place, and time.     Cranial Nerves: No cranial nerve deficit.  Psychiatric:        Mood and Affect: Mood normal.        Behavior: Behavior normal.        Thought Content: Thought content normal.     ED Results / Procedures / Treatments   Labs (all labs ordered are listed, but only abnormal results are displayed) Results for orders placed or performed during the hospital encounter of 02/24/20  Resp Panel by RT-PCR (Flu A&B, Covid) Nasopharyngeal Swab   Specimen: Nasopharyngeal Swab; Nasopharyngeal(NP) swabs in vial transport medium  Result Value Ref Range   SARS Coronavirus 2 by RT PCR NEGATIVE NEGATIVE   Influenza A by PCR NEGATIVE NEGATIVE   Influenza B by PCR NEGATIVE NEGATIVE      EKG None  Radiology No results found.  Procedures Procedures (including critical care time)  Medications Ordered in ED Medications - No data to display  ED Course  I have reviewed the triage vital signs and the nursing notes.  Pertinent labs & imaging results that were available during my care of the patient were reviewed by me and considered in my medical decision making (see chart for details).    MDM Rules/Calculators/A&P                          Patient is a non-smoker.  She was reassured she has a viral URI.  She can do symptomatic  treatment.   Final Clinical Impression(s) / ED Diagnoses Final diagnoses:  Upper respiratory tract infection, unspecified  type    Rx / DC Orders ED Discharge Orders    None     Plan discharge  Devoria Albe, MD, Concha Pyo, MD 02/24/20 [phone]

## 2020-05-01 ENCOUNTER — Ambulatory Visit: Payer: Medicaid Other | Attending: Obstetrics & Gynecology | Admitting: Physical Therapy

## 2020-05-01 ENCOUNTER — Encounter: Payer: Self-pay | Admitting: Physical Therapy

## 2020-05-01 ENCOUNTER — Other Ambulatory Visit: Payer: Self-pay

## 2020-05-01 DIAGNOSIS — M6281 Muscle weakness (generalized): Secondary | ICD-10-CM | POA: Diagnosis present

## 2020-05-01 DIAGNOSIS — N393 Stress incontinence (female) (male): Secondary | ICD-10-CM | POA: Diagnosis present

## 2020-05-01 DIAGNOSIS — R278 Other lack of coordination: Secondary | ICD-10-CM | POA: Insufficient documentation

## 2020-05-01 NOTE — Patient Instructions (Signed)
Access Code: [email or web address] URL: [email or web address] Date: 05/01/2020 Prepared by: Eulis Foster  Program Notes massage the back of the vaginal canal gently to improve blood flow   Exercises Seated Pelvic Floor Contraction - 3 x daily - 7 x weekly - 1 sets - 5 reps - 5 hold [facility] [address], [address] [place], [place] [address] Phone # [phone] Fax [phone]

## 2020-05-01 NOTE — Therapy (Signed)
[facility] [address] [place], [place], [address] Phone: [phone]   Fax:  [phone]  Physical Therapy Evaluation  Patient Details  Name: Diane [name] MRN: [id number] Date of Birth: 1995/10/23 Referring Provider (PT): Dr. Hoover Browns   Encounter Date: 05/01/2020   PT End of Session - 05/01/20 1615    Visit Number 1    Date for PT Re-Evaluation 07/24/20    Authorization Type [place] [employer]    PT Start Time 1546    PT Stop Time 1640    PT Time Calculation (min) 54 min    Activity Tolerance Patient tolerated treatment well    Behavior During Therapy [facility] for tasks assessed/performed           Past Medical History:  Diagnosis Date  . Anemia   . Anxiety   . Headache   . UTI (lower urinary tract infection)     History reviewed. No pertinent surgical history.  There were no vitals filed for this visit.    Subjective Assessment - 05/01/20 1551    Subjective Patient kids are 15 months apart. Both were delivered vaginally. Had the leakage when she had her second child but has been getting worse. When sneezes she will leak or waiting too long to urinate. Coughing and laughing will leak.    Patient Stated Goals reduce urinary leakage    Currently in Pain? Yes    Pain Score 8     Pain Location Pelvis    Pain Orientation Lower    Pain Descriptors / Indicators Cramping    Pain Type Chronic pain    Pain Onset More than a month ago    Pain Frequency Intermittent    Aggravating Factors  comes randomly    Pain Relieving Factors sit down and squat    Multiple Pain Sites No              [facility] PT Assessment - 05/01/20 0001      Assessment   Medical Diagnosis N39.3 stress incontinence    Referring Provider (PT) Dr. Hoover Browns    Onset Date/Surgical Date --   11/07/2016   Prior Therapy none      Precautions   Precautions None      Restrictions   Weight Bearing Restrictions No      Balance Screen   Has the  patient fallen in the past 6 months No    Has the patient had a decrease in activity level because of a fear of falling?  No    Is the patient reluctant to leave their home because of a fear of falling?  No      Home [employer] information [employer] manager residence      Prior Function   Level of Independence Independent    Vocation Full time employment    Vocation Requirements standing, lifting, climbing    Leisure none      Cognition   Overall Cognitive Status Within Functional Limits for tasks assessed      Posture/Postural Control   Posture/Postural Control No significant limitations      ROM / Strength   AROM / PROM / Strength AROM;PROM;Strength      Strength   Right Hip ABduction 3+/5    Left Hip ABduction 5/5      Palpation   SI assessment  left ilium posteriorly rotated    Palpation comment tenderness located suprapubically and left lower abdomen  Special Tests    Special Tests Sacrolliac Tests    Sacroiliac Tests  Pelvic Compression      Pelvic Compression   Findings Positive    Side Right    comment pain                      Objective measurements completed on examination: See above findings.     Pelvic Floor Special Questions - 05/01/20 0001    Prior Pregnancies Yes    Number of Pregnancies 2    Number of Vaginal Deliveries 2   3rd degree tear for both   Diastasis Recti 2 fingers width above umbilicus, 1 finger width below umbilicus    Currently Sexually Active Yes    Is this Painful Yes   sometimes with pain level 3-4/10 and switch positions   Marinoff Scale discomfort that does not affect completion    Urinary Leakage Yes    Activities that cause leaking With strong urge;Coughing;Sneezing;Laughing    Urinary urgency Yes    Urinary frequency does not always feel like she is fully emptying her bladder.    Fecal incontinence No    Falling out feeling (prolapse) --   sometimes, randomly happens   Skin Integrity Intact    Scar Other     Scar other erythema along the lower part of the introitus where her 3rd degree tear was    Prolapse Anterior Wall    Pelvic Floor Internal Exam Patient confirms identification and approves PT to assess pelvic floor and treatment    Exam Type Vaginal    Palpation band from uterus to bladder, tightness on the anterior cervix, decreased movement of the right urethra sphincter, tightness along the bulbocavernosus    Strength weak squeeze, no lift   good circular contraction           [facility] Adult PT Treatment/Exercise - 05/01/20 0001      Lumbar Exercises: Seated   Other Seated Lumbar Exercises pelvic floor contraction holding for 5 seconds 5 times      Manual Therapy   Manual Therapy Internal Pelvic Floor    Manual therapy comments educated patient on how to perform manual work to the post. intoitus to improve tissue mobitlity and bloodflow to reduce tearing with intercourse    Internal Pelvic Floor manual mobilization of the cervix to reduce anterior restrictions; manual work on the band from the cervix to the bladder and along the bulbocavernosus                  PT Education - 05/01/20 1646    Education Details Access Code: [email or web address]    Person(s) Educated Patient    Methods Explanation;Demonstration;Verbal cues;Handout    Comprehension Returned demonstration;Verbalized understanding            PT Short Term Goals - 05/01/20 1656      PT SHORT TERM GOAL #1   Title be independent in initial HEP    Baseline not educated yet    Time 4    Period Weeks    Target Date 05/29/20      PT SHORT TERM GOAL #2   Title ---             PT Long Term Goals - 05/01/20 1657      PT LONG TERM GOAL #1   Title be independent in advanced HEP    Time 12    Period Weeks    Status New  Target Date 07/24/20      PT LONG TERM GOAL #2   Title pelvic pain decreased </= 0-1 due to improve pelvic floor tissue mobility    Baseline pain level is 8/10    Time 12    Period Weeks     Target Date 07/24/20      PT LONG TERM GOAL #3   Title able to walk to the commode when she has the urge without leaking due to improve pelvic floor strength >/= 3/5    Baseline leak while walking to commode and strength is 2/5    Time 12    Period Weeks    Status New    Target Date 07/24/20      PT LONG TERM GOAL #4   Title urinary leakage with coughing, sneezing, and laughing decreased >/= 75% due to improve pelvic floor strength >/= 3/5    Baseline strength is 2/5    Time 12    Period Weeks    Status New    Target Date 07/24/20                  Plan - 05/01/20 1616    Clinical Impression Statement Patient is a 25 year old female with stress incontinence and pelvic cramping since her last child was born on 11/07/2016.Marland Kitchen She had 2 vaginal deliveries with third degree tears and 15 months apart. Patient reports sporacic pelvic cramping with no reason at level 8/10. Patient will tear at the scar from her tear during intercourse. Patient reports she will leak urine when she has the urge and not able to get to the commode, laughing, coughing, sneezing, bending. Patient  will have trouble emptying her bladder. Patient does not wear a pad. Pelvic floor strength is 2/5 with good circular contraction but not lift. She has redness where her third degree tear was. Patient has anterior wall weakness. Patient has tenderness located in the bulbocavernosus. She has restrictions from the bladder to the cervix, cervix s restricted anteriorly. Patient left ilium is rotated posteriorly and left hip abduction is 3+/5. Patient has 2 finger width diastasis above the umbilicus and 1 finger width below the umbilicus. Patient will benefit from skilled therapy to improve pelvic floor coordination and strength.    Personal Factors and Comorbidities Age;Fitness;Profession    Examination-Activity Limitations Continence    Examination-Participation Restrictions Community Activity;Interpersonal Relationship     Stability/Clinical Decision Making Stable/Uncomplicated    Clinical Decision Making Low    Rehab Potential Excellent    PT Frequency 1x / week    PT Duration 12 weeks    PT Treatment/Interventions ADLs/Self Care Home Management;Biofeedback;Cryotherapy;Moist Heat;Ultrasound;Neuromuscular re-education;Therapeutic exercise;Therapeutic activities;Patient/family education;Manual techniques;Scar mobilization;Dry needling;Spinal Manipulations    PT Next Visit Plan manual work to bulbocavernosus and check cervix, abdominal contraction, correct pelvis, engagment of the abdominals    PT Home Exercise Plan Access Code: [email or web address]    Consulted and Agree with Plan of Care Patient           Patient will benefit from skilled therapeutic intervention in order to improve the following deficits and impairments:  Decreased coordination,Increased fascial restricitons,Pain,Decreased endurance,Decreased activity tolerance,Decreased strength,Increased muscle spasms  Visit Diagnosis: Muscle weakness (generalized) - Plan: PT plan of care cert/re-cert  Other lack of coordination - Plan: PT plan of care cert/re-cert  Stress incontinence (female) (female) - Plan: PT plan of care cert/re-cert     Problem List Patient Active Problem List   Diagnosis Date Noted  . Major depressive disorder,  recurrent severe without psychotic features (HCC) 09/07/2016  . Anxiety 03/29/2015  . 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, [address] [place], [place], [address] Phone: [phone]   Fax:  [phone]  Name: Diane [name] MRN: [id number] Date of Birth: 10-Jan-1996

## 2020-05-14 ENCOUNTER — Ambulatory Visit: Payer: Medicaid Other | Admitting: Physical Therapy

## 2020-05-14 ENCOUNTER — Other Ambulatory Visit: Payer: Self-pay

## 2020-05-14 ENCOUNTER — Encounter: Payer: Self-pay | Admitting: Physical Therapy

## 2020-05-14 DIAGNOSIS — M6281 Muscle weakness (generalized): Secondary | ICD-10-CM

## 2020-05-14 DIAGNOSIS — N393 Stress incontinence (female) (male): Secondary | ICD-10-CM

## 2020-05-14 DIAGNOSIS — R278 Other lack of coordination: Secondary | ICD-10-CM

## 2020-05-14 NOTE — Therapy (Signed)
[facility] [address] [place], [place], [address] Phone: [phone]   Fax:  [phone]  Physical Therapy Treatment  Patient Details  Name: Diane [name] MRN: [id number] Date of Birth: Dec 15, 1995 Referring Provider (PT): Dr. Hoover Browns   Encounter Date: 05/14/2020   PT End of Session - 05/14/20 0851    Visit Number 2    Date for PT Re-Evaluation 07/24/20    Authorization Type [place] [employer]    Authorization Time Period 3/3-5/2    Authorization - Visit Number 1    Authorization - Number of Visits 4    PT Start Time 0850    PT Stop Time 0928    PT Time Calculation (min) 38 min    Activity Tolerance Patient tolerated treatment well    Behavior During Therapy [facility] for tasks assessed/performed           Past Medical History:  Diagnosis Date  . Anemia   . Anxiety   . Headache   . UTI (lower urinary tract infection)     History reviewed. No pertinent surgical history.  There were no vitals filed for this visit.   Subjective Assessment - 05/14/20 0852    Subjective I feel like I have alot of gas in my vaginal canal. I only had one cramp 1 time this week compared to 3 times this week.    Patient Stated Goals reduce urinary leakage    Currently in Pain? Yes    Pain Score 6     Pain Location Pelvis    Pain Orientation Lower    Pain Descriptors / Indicators Sore    Pain Type Chronic pain    Pain Onset More than a month ago    Pain Frequency Intermittent    Aggravating Factors  comes randomly    Pain Relieving Factors sit down and squat    Multiple Pain Sites No              [facility] PT Assessment - 05/14/20 0001      Palpation   SI assessment  left ilium posteriorly rotated                      Pelvic Floor Special Questions - 05/14/20 0001    Pelvic Floor Internal Exam Patient confirms identification and approves PT to assess pelvic floor and treatment    Exam Type Vaginal    Strength  fair squeeze, definite lift   good lift and circular contraction            [facility] Adult PT Treatment/Exercise - 05/14/20 0001      Lumbar Exercises: Stretches   Lower Trunk Rotation 2 reps;30 seconds    Lower Trunk Rotation Limitations each side    Hip Flexor Stretch Right;Left;1 rep;30 seconds    Hip Flexor Stretch Limitations 1/2 kneel    Piriformis Stretch Right;Left;1 rep    Piriformis Stretch Limitations supine    Other Lumbar Stretch Exercise happy baby holding 1 min    Other Lumbar Stretch Exercise reverse clam both sides 10x      Manual Therapy   Manual Therapy Joint mobilization;Internal Pelvic Floor    Manual therapy comments manually rotated lower body to the left for stretching    Joint Mobilization PA mobilization to right side of SI joint; PA and rotational mobilization grade 3 to L1-L5    Internal Pelvic Floor manual mobilization to bilateral obturator internist  PT Education - 05/14/20 0924    Education Details Access Code: [email or web address]    Person(s) Educated Patient    Methods Explanation;Demonstration;Handout    Comprehension Verbalized understanding;Returned demonstration            PT Short Term Goals - 05/01/20 1656      PT SHORT TERM GOAL #1   Title be independent in initial HEP    Baseline not educated yet    Time 4    Period Weeks    Target Date 05/29/20      PT SHORT TERM GOAL #2   Title ---             PT Long Term Goals - 05/01/20 1657      PT LONG TERM GOAL #1   Title be independent in advanced HEP    Time 12    Period Weeks    Status New    Target Date 07/24/20      PT LONG TERM GOAL #2   Title pelvic pain decreased </= 0-1 due to improve pelvic floor tissue mobility    Baseline pain level is 8/10    Time 12    Period Weeks    Target Date 07/24/20      PT LONG TERM GOAL #3   Title able to walk to the commode when she has the urge without leaking due to improve pelvic floor strength >/= 3/5     Baseline leak while walking to commode and strength is 2/5    Time 12    Period Weeks    Status New    Target Date 07/24/20      PT LONG TERM GOAL #4   Title urinary leakage with coughing, sneezing, and laughing decreased >/= 75% due to improve pelvic floor strength >/= 3/5    Baseline strength is 2/5    Time 12    Period Weeks    Status New    Target Date 07/24/20                 Plan - 05/14/20 0914    Clinical Impression Statement Patient pelvic floor strength increased to 3/5 wiht good lift and circular contraction. Patient had tightness in bilateral obturator internist. Patient was educated in stretches for the hips to elongate the pelvic floor. She reports air coming out of the vaginal when she is sitting. The cervix feels it has good mobility. Pelvis in correct alignment after manual work. Patient has only had the cramp one time since last visit compared to 3 times per week. Patient will benefit from skilled therapy to improve pelvic floor coordination and strength.    Personal Factors and Comorbidities Age;Fitness;Profession    Examination-Activity Limitations Continence    Examination-Participation Restrictions Community Activity;Interpersonal Relationship    Stability/Clinical Decision Making Stable/Uncomplicated    Rehab Potential Excellent    PT Frequency 1x / week    PT Duration 12 weeks    PT Treatment/Interventions ADLs/Self Care Home Management;Biofeedback;Cryotherapy;Moist Heat;Ultrasound;Neuromuscular re-education;Therapeutic exercise;Therapeutic activities;Patient/family education;Manual techniques;Scar mobilization;Dry needling;Spinal Manipulations    PT Next Visit Plan abdominal contraction with pelvic floor contraction; right hip er strength.    PT Home Exercise Plan Access Code: [email or web address]    Consulted and Agree with Plan of Care Patient           Patient will benefit from skilled therapeutic intervention in order to improve the following deficits and  impairments:  Decreased coordination,Increased fascial restricitons,Pain,Decreased endurance,Decreased activity tolerance,Decreased strength,Increased muscle spasms  Visit  Diagnosis: Muscle weakness (generalized)  Other lack of coordination  Stress incontinence (female) (female)     Problem List Patient Active Problem List   Diagnosis Date Noted  . Major depressive disorder, recurrent severe without psychotic features (HCC) 09/07/2016  . Anxiety 03/29/2015  . 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 [place], [place], [address] Phone: [phone]   Fax:  [phone]  Name: Eeva Schlosser MRN: [id number] Date of Birth: 06/10/1995

## 2020-05-14 NOTE — Patient Instructions (Signed)
Access Code: [email or web address] URL: [email or web address] Date: 05/14/2020 Prepared by: Eulis Foster  Program Notes massage the back of the vaginal canal gently to improve blood flow   Exercises Seated Pelvic Floor Contraction - 3 x daily - 7 x weekly - 1 sets - 5 reps - 5 hold Supine Piriformis Stretch with Leg Straight - 1 x daily - 7 x weekly - 1 sets - 2 reps - 30 sec hold Supine Figure 4 Piriformis Stretch with Leg Extension - 1 x daily - 7 x weekly - 1 sets - 2 reps - 30 sec hold Supine Pelvic Floor Stretch - 1 x daily - 7 x weekly - 1 sets - 1 reps - 1 min hold Half Kneeling Hip Flexor Stretch with Sidebend - 1 x daily - 7 x weekly - 1 sets - 2 reps Sidelying Reverse Clamshell - 1 x daily - 7 x weekly - 1 sets - 10 reps - 1 sec hold [facility] [address] [place], [place] [address] Phone # [phone] Fax [phone]

## 2020-05-19 ENCOUNTER — Ambulatory Visit: Payer: Medicaid Other | Admitting: Physical Therapy

## 2020-05-30 ENCOUNTER — Ambulatory Visit: Payer: Medicaid Other | Attending: Obstetrics & Gynecology | Admitting: Physical Therapy

## 2020-05-30 ENCOUNTER — Other Ambulatory Visit: Payer: Self-pay

## 2020-05-30 ENCOUNTER — Encounter: Payer: Self-pay | Admitting: Physical Therapy

## 2020-05-30 DIAGNOSIS — N393 Stress incontinence (female) (male): Secondary | ICD-10-CM

## 2020-05-30 DIAGNOSIS — R278 Other lack of coordination: Secondary | ICD-10-CM

## 2020-05-30 DIAGNOSIS — M6281 Muscle weakness (generalized): Secondary | ICD-10-CM

## 2020-05-30 NOTE — Therapy (Signed)
[facility] [address] [place], [place], [address] Phone: [phone]   Fax:  [phone]  Physical Therapy Treatment  Patient Details  Name: Diane [name] MRN: [id number] Date of Birth: Mar 25, 1995 Referring Provider (PT): Dr. Hoover Browns   Encounter Date: 05/30/2020   PT End of Session - 05/30/20 0905    Visit Number 3    Date for PT Re-Evaluation 07/24/20    Authorization Type [place] [employer]    Authorization Time Period 3/3-5/2    Authorization - Visit Number 2    Authorization - Number of Visits 4    PT Start Time 0902   came late due to being lost   PT Stop Time 0930    PT Time Calculation (min) 28 min    Activity Tolerance Patient tolerated treatment well    Behavior During Therapy [facility] for tasks assessed/performed           Past Medical History:  Diagnosis Date  . Anemia   . Anxiety   . Headache   . UTI (lower urinary tract infection)     History reviewed. No pertinent surgical history.  There were no vitals filed for this visit.   Subjective Assessment - 05/30/20 0903    Subjective I have not had cramps since last visit. Still have the air coming out of the vaginal area but is less. No urinary leakage since last visit.    Patient Stated Goals reduce urinary leakage    Currently in Pain? No/denies                             [facility] Adult PT Treatment/Exercise - 05/30/20 0001      Lumbar Exercises: Supine   Bridge with clamshell 20 reps;1 second    Isometric Hip Flexion 10 reps;5 seconds    Isometric Hip Flexion Limitations each leg to engage the lower abdominals; then do the same with opposite arm and leg to assist in abdominal engagement      Lumbar Exercises: Sidelying   Clam Right;Left;15 reps    Clam Limitations tactile cues to contract the abdominals                  PT Education - 05/30/20 0923    Education Details Access Code: [email or web address]    Person(s) Educated  Patient    Methods Explanation;Demonstration;Verbal cues    Comprehension Returned demonstration;Verbalized understanding            PT Short Term Goals - 05/30/20 0928      PT SHORT TERM GOAL #1   Title be independent in initial HEP    Time 4    Period Weeks    Status Achieved             PT Long Term Goals - 05/01/20 1657      PT LONG TERM GOAL #1   Title be independent in advanced HEP    Time 12    Period Weeks    Status New    Target Date 07/24/20      PT LONG TERM GOAL #2   Title pelvic pain decreased </= 0-1 due to improve pelvic floor tissue mobility    Baseline pain level is 8/10    Time 12    Period Weeks    Target Date 07/24/20      PT LONG TERM GOAL #3   Title able to walk to the commode  when she has the urge without leaking due to improve pelvic floor strength >/= 3/5    Baseline leak while walking to commode and strength is 2/5    Time 12    Period Weeks    Status New    Target Date 07/24/20      PT LONG TERM GOAL #4   Title urinary leakage with coughing, sneezing, and laughing decreased >/= 75% due to improve pelvic floor strength >/= 3/5    Baseline strength is 2/5    Time 12    Period Weeks    Status New    Target Date 07/24/20                 Plan - 05/30/20 0350    Clinical Impression Statement Patient has not had urinary leakage since last visit but she did have one instance she almost leaked. Patient has not had any pelvic pain since last visit. She has difficulty with engaging her lower abdominals and feeling the tightness. Patient has to start at a low level for abdominal exercise. Patient is progressing well. patient will benefit from skilled therapy to improve pelvic floor  coordination and strength.    Personal Factors and Comorbidities Age;Fitness;Profession    Examination-Activity Limitations Continence    Examination-Participation Restrictions Community Activity;Interpersonal Relationship    Stability/Clinical Decision  Making Stable/Uncomplicated    Rehab Potential Excellent    PT Frequency 1x / week    PT Duration 12 weeks    PT Treatment/Interventions ADLs/Self Care Home Management;Biofeedback;Cryotherapy;Moist Heat;Ultrasound;Neuromuscular re-education;Therapeutic exercise;Therapeutic activities;Patient/family education;Manual techniques;Scar mobilization;Dry needling;Spinal Manipulations    PT Next Visit Plan work on advancing the core exercise, check on pelvic floor alignment, check right hip abduction strength    PT Home Exercise Plan Access Code: [email or web address]    Consulted and Agree with Plan of Care Patient           Patient will benefit from skilled therapeutic intervention in order to improve the following deficits and impairments:  Decreased coordination,Increased fascial restricitons,Pain,Decreased endurance,Decreased activity tolerance,Decreased strength,Increased muscle spasms  Visit Diagnosis: Muscle weakness (generalized)  Other lack of coordination  Stress incontinence (female) (female)     Problem List Patient Active Problem List   Diagnosis Date Noted  . Major depressive disorder, recurrent severe without psychotic features (HCC) 09/07/2016  . Anxiety 03/29/2015  . 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 [place], [place], [address] Phone: [phone]   Fax:  [phone]  Name: Diane [name] MRN: [id number] Date of Birth: [birth year]

## 2020-05-30 NOTE — Patient Instructions (Signed)
Access Code: [email or web address] URL: [email or web address] Date: 05/30/2020 Prepared by: Eulis Foster  Program Notes massage the back of the vaginal canal gently to improve blood flow   Exercises Seated Pelvic Floor Contraction - 3 x daily - 7 x weekly - 1 sets - 5 reps - 5 hold Supine Piriformis Stretch with Leg Straight - 1 x daily - 7 x weekly - 1 sets - 2 reps - 30 sec hold Supine Figure 4 Piriformis Stretch with Leg Extension - 1 x daily - 7 x weekly - 1 sets - 2 reps - 30 sec hold Supine Pelvic Floor Stretch - 1 x daily - 7 x weekly - 1 sets - 1 reps - 1 min hold Half Kneeling Hip Flexor Stretch with Sidebend - 1 x daily - 7 x weekly - 1 sets - 2 reps Sidelying Reverse Clamshell - 1 x daily - 7 x weekly - 1 sets - 10 reps - 1 sec hold Clamshell - 1 x daily - 7 x weekly - 1 sets - 15 reps Bridge with Hip Abduction and Resistance - 1 x daily - 7 x weekly - 1 sets - 15 reps Hooklying Isometric Hip Flexion - 1 x daily - 7 x weekly - 1 sets - 10 reps - 5 sec hold Hooklying Isometric Hip Flexion with Opposite Arm - 1 x daily - 7 x weekly - 1 sets - 10 reps [facility] [address], [address] [place], [place] [address] Phone # [phone] Fax [phone]

## 2020-06-11 ENCOUNTER — Encounter: Payer: Medicaid Other | Admitting: Physical Therapy

## 2020-06-19 ENCOUNTER — Encounter: Payer: Self-pay | Admitting: Emergency Medicine

## 2020-06-19 ENCOUNTER — Other Ambulatory Visit: Payer: Self-pay

## 2020-06-19 ENCOUNTER — Ambulatory Visit
Admission: EM | Admit: 2020-06-19 | Discharge: 2020-06-19 | Disposition: A | Discharge status: Home / Self Care | Payer: Medicaid Other | Attending: Emergency Medicine | Admitting: Emergency Medicine

## 2020-06-19 DIAGNOSIS — H5789 Other specified disorders of eye and adnexa: Secondary | ICD-10-CM

## 2020-06-19 DIAGNOSIS — Z0289 Encounter for other administrative examinations: Secondary | ICD-10-CM | POA: Diagnosis not present

## 2020-06-19 DIAGNOSIS — R112 Nausea with vomiting, unspecified: Secondary | ICD-10-CM

## 2020-06-19 MED ORDER — GENTEAL MILD 0.2 % OP SOLN: OPHTHALMIC | 0 refills | Status: DC

## 2020-06-19 MED ORDER — ONDANSETRON HCL 4 MG PO TABS: 4.0000 mg | ORAL_TABLET | Freq: Four times a day (QID) | ORAL | 0 refills | Status: DC

## 2020-06-19 MED ORDER — DICYCLOMINE HCL 20 MG PO TABS: 20.0000 mg | ORAL_TABLET | Freq: Two times a day (BID) | ORAL | 0 refills | Status: DC

## 2020-06-19 NOTE — ED Triage Notes (Signed)
Diarrhea since the weekend.  Right eye pain x 2 days.

## 2020-06-19 NOTE — ED Provider Notes (Signed)
[facility]   [id number] 06/19/20 Arrival Time: 0816  CC: ABDOMINAL DISCOMFORT  SUBJECTIVE:  Diane [name] is a 25 y.o. female who presents with complaint of nausea, vomiting, and diarrhea x few days.  Denies a precipitating event, trauma, close contacts with similar symptoms, recent travel or antibiotic use.  Complains of abdominal cramping.  Has tried/ OTC medications without relief.  Denies alleviating or aggravating factors.  Reports similar symptoms in the past.   Complains of fatigue.    Denies fever, chills, chest pain, SOB, diarrhea, constipation, hematochezia, melena, dysuria, difficulty urinating, increased frequency or urgency, flank pain, loss of bowel or bladder function, vaginal discharge, vaginal odor, vaginal bleeding, dyspareunia, pelvic pain.     Also mention RT eye irritation from rubbing eye when she is tired.    Patient request work note.    No LMP recorded. (Menstrual status: Oral contraceptives).  ROS: As per HPI.  All other pertinent ROS negative.     Past Medical History:  Diagnosis Date  . Anemia   . Anxiety   . Headache   . UTI (lower urinary tract infection)    History reviewed. No pertinent surgical history. Allergies  Allergen Reactions  . Cefzil [Cefprozil] Hives  . Rocephin [Ceftriaxone Sodium In Dextrose] Hives   No current facility-administered medications on file prior to encounter.   Current Outpatient Medications on File Prior to Encounter  Medication Sig Dispense Refill  . acetaminophen (TYLENOL) 500 MG tablet Take 1,000 mg by mouth every 6 (six) hours as needed for mild pain, moderate pain, fever or headache.     . cholecalciferol (VITAMIN D3) 25 MCG (1000 UNIT) tablet Take 1,000 Units by mouth daily.    . Drospirenone (SLYND) 4 MG TABS Take by mouth.    . [DISCONTINUED] norethindrone (ORTHO MICRONOR) 0.35 MG tablet Take 1 tablet (0.35 mg total) by mouth daily. 1 Package 11  . [DISCONTINUED] sertraline (ZOLOFT) 100 MG tablet  Take 1 tablet (100 mg total) by mouth at bedtime. 30 tablet 3   Social History   Socioeconomic History  . Marital status: Single    Spouse name: Not on file  . Number of children: Not on file  . Years of education: Not on file  . Highest education level: Not on file  Occupational History  . Not on file  Tobacco Use  . Smoking status: Never Smoker  . Smokeless tobacco: Never Used  Vaping Use  . Vaping Use: Never used  Substance and Sexual Activity  . Alcohol use: No  . Drug use: No  . Sexual activity: Yes    Partners: Female    Birth control/protection: None  Other Topics Concern  . Not on file  Social History Narrative  . Not on file   Social Determinants of Health   Financial Resource Strain: Not on file  Food Insecurity: Not on file  Transportation Needs: Not on file  Physical Activity: Not on file  Stress: Not on file  Social Connections: Not on file  Intimate Partner Violence: Not on file   Family History  Problem Relation Age of Onset  . Hypertension Mother   . Hypertension Maternal Grandmother   . Diabetes Maternal Grandmother   . Congestive Heart Failure Maternal Grandmother   . Hypertension Maternal Uncle   . Diabetes Maternal Uncle   . Hypertension Maternal Uncle   . Diabetes Maternal Uncle      OBJECTIVE:  Vitals:   06/19/20 0825  BP: 114/77  Pulse: 84  Resp:  18  Temp: 98.5 F (36.9 C)  TempSrc: Oral  SpO2: 97%    General appearance: Alert; NAD HEENT: NCAT; PERRL, EOMI grossly, no obvious irritation, drainage; Oropharynx clear.  Lungs: clear to auscultation bilaterally without adventitious breath sounds Heart: regular rate and rhythm.   Abdomen: soft, non-distended; normal active bowel sounds; non-tender to light and deep palpation;  no guarding Extremities: no edema; symmetrical with no gross deformities Skin: warm and dry Neurologic: normal gait Psychological: alert and cooperative; normal mood and affect  ASSESSMENT & PLAN:  1.  Encounter to obtain excuse from work   2. Nausea vomiting and diarrhea   3. Irritation of right eye     Meds ordered this encounter  Medications  . ondansetron (ZOFRAN) 4 MG tablet    Sig: Take 1 tablet (4 mg total) by mouth every 6 (six) hours.    Dispense:  12 tablet    Refill:  0    Order Specific Question:   Supervising Provider    Answer:   Eustace Moore [[id number]]  . dicyclomine (BENTYL) 20 MG tablet    Sig: Take 1 tablet (20 mg total) by mouth 2 (two) times daily.    Dispense:  20 tablet    Refill:  0    Order Specific Question:   Supervising Provider    Answer:   Eustace Moore [[id number]]  . Hypromellose (GENTEAL MILD) 0.2 % SOLN    Sig: Use as directed for eye irritation    Dispense:  15 mL    Refill:  0    Order Specific Question:   Supervising Provider    Answer:   Eustace Moore [[id number]]    Zofran for nausea and vomiting Stick to a bland/ high fiber diet.  Stay away from greasy, fried, or fatty foods as this may make your symptoms worse You may try incorporating OTC miralax.  This may bulk up your stools Bentyl prescribed for abdominal cramping.  Use as directed Anticipate follow up with PCP if symptoms persists If you experience new or worsening symptoms return or go to ER such as fever, chills, nausea, vomiting, fatigue, lightheadedness, dizziness, profuse watery diarrhea, bloody or dark tarry stools, constipation, urinary symptoms, worsening abdominal discomfort, symptoms that do not improve with medications, inability to keep fluids down, etc...   Reviewed expectations re: course of current medical issues. Questions answered. Outlined signs and symptoms indicating need for more acute intervention. Patient verbalized understanding. After Visit Summary given.   Alvino Chapel [place], PA-C 06/19/20 [phone]

## 2020-06-19 NOTE — Discharge Instructions (Addendum)
Zofran for nausea and vomiting Stick to a bland/ high fiber diet.  Stay away from greasy, fried, or fatty foods as this may make your symptoms worse You may try incorporating OTC miralax.  This may bulk up your stools Bentyl prescribed for abdominal cramping.  Use as directed Anticipate follow up with PCP if symptoms persists If you experience new or worsening symptoms return or go to ER such as fever, chills, nausea, vomiting, fatigue, lightheadedness, dizziness, profuse watery diarrhea, bloody or dark tarry stools, constipation, urinary symptoms, worsening abdominal discomfort, symptoms that do not improve with medications, inability to keep fluids down, etc...   DO NOT TAKE ABOVE MEDICATION IF NURSING  Eye drops prescribed.  Use as directed

## 2020-06-23 ENCOUNTER — Other Ambulatory Visit: Payer: Self-pay

## 2020-06-23 ENCOUNTER — Encounter: Payer: Self-pay | Admitting: Physical Therapy

## 2020-06-23 ENCOUNTER — Ambulatory Visit: Payer: Medicaid Other | Admitting: Physical Therapy

## 2020-06-23 DIAGNOSIS — M6281 Muscle weakness (generalized): Secondary | ICD-10-CM

## 2020-06-23 DIAGNOSIS — N393 Stress incontinence (female) (male): Secondary | ICD-10-CM

## 2020-06-23 DIAGNOSIS — R278 Other lack of coordination: Secondary | ICD-10-CM

## 2020-06-23 NOTE — Therapy (Signed)
[facility] [address], [address] [place], [place], [address] Phone: [phone]   Fax:  [phone]  Physical Therapy Treatment  Patient Details  Name: Diane [name] MRN: [id number] Date of Birth: 1995-08-03 Referring Provider (PT): Dr. Hoover Browns   Encounter Date: 06/23/2020   PT End of Session - 06/23/20 1021    Visit Number 4    Date for PT Re-Evaluation 07/24/20    Authorization Type [employer]    Authorization Time Period 3/3-5/2    Authorization - Visit Number 3    Authorization - Number of Visits 4    PT Start Time 1015    PT Stop Time 1055    PT Time Calculation (min) 40 min    Activity Tolerance Patient tolerated treatment well    Behavior During Therapy [facility] for tasks assessed/performed           Past Medical History:  Diagnosis Date  . Anemia   . Anxiety   . Headache   . UTI (lower urinary tract infection)     History reviewed. No pertinent surgical history.  There were no vitals filed for this visit.   Subjective Assessment - 06/23/20 1019    Subjective Less cramping with less often but still same pain level. Still has air come out of the vaginal. No urinary leakage since last visit but had a close call.    Patient Stated Goals reduce urinary leakage    Currently in Pain? Yes    Pain Score 7     Pain Location Abdomen    Pain Orientation Lower    Pain Descriptors / Indicators Cramping;Sore    Pain Type Acute pain    Pain Onset More than a month ago    Pain Frequency Intermittent    Aggravating Factors  comes randomly    Pain Relieving Factors sit down and squat    Multiple Pain Sites No              [facility] PT Assessment - 06/23/20 0001      Assessment   Medical Diagnosis N39.3 stress incontinence    Referring Provider (PT) Dr. Hoover Browns    Prior Therapy none      Precautions   Precautions None      Restrictions   Weight Bearing Restrictions No      Prior Function   Level of  Independence Independent    Vocation Full time employment    Vocation Requirements standing, lifting, climbing    Leisure none      Cognition   Overall Cognitive Status Within Functional Limits for tasks assessed      Posture/Postural Control   Posture/Postural Control No significant limitations      ROM / Strength   AROM / PROM / Strength AROM;PROM      Strength   Right Hip ABduction 4/5    Left Hip ABduction 4/5      Palpation   SI assessment  left ilium posteriorly rotated      Pelvic Compression   Findings Negative    Side Left    comment pain in the left hip not the SI joint                      Pelvic Floor Special Questions - 06/23/20 0001    Urinary Leakage No    Activities that cause leaking --   almost leaked with sneeze   Urinary frequency none  [facility] Adult PT Treatment/Exercise - 06/23/20 0001      Lumbar Exercises: Stretches   Hip Flexor Stretch Right;Left;2 reps;30 seconds    Hip Flexor Stretch Limitations lunge position with the same arm reaching overhead to the side      Lumbar Exercises: Aerobic   Recumbent Bike level 3 for 65 minutes while to assessing patient      Lumbar Exercises: Supine   Bridge 15 reps;1 second    Bridge Limitations keeping the pelvis balanced      Manual Therapy   Manual Therapy Joint mobilization;Soft tissue mobilization    Joint Mobilization Anterior glide to the left hip grade 3    Soft tissue mobilization using the addaday to the left quadricep and Tibial band in supine with left leg off mat to put muscle on stretch; Manual work to the psoas in supine and left leg on therapist leg                    PT Short Term Goals - 05/30/20 6294      PT SHORT TERM GOAL #1   Title be independent in initial HEP    Time 4    Period Weeks    Status Achieved             PT Long Term Goals - 06/23/20 1100      PT LONG TERM GOAL #1   Title be independent in advanced HEP    Baseline  still learning    Time 12    Period Weeks    Status On-going      PT LONG TERM GOAL #2   Title pelvic pain decreased </= 0-1 due to improve pelvic floor tissue mobility    Baseline pain level is 8/10 but not as often    Time 12    Period Weeks    Status On-going      PT LONG TERM GOAL #3   Title able to walk to the commode when she has the urge without leaking due to improve pelvic floor strength >/= 3/5    Baseline no leakage but almost    Time 12    Period Weeks    Status On-going      PT LONG TERM GOAL #4   Title urinary leakage with coughing, sneezing, and laughing decreased >/= 75% due to improve pelvic floor strength >/= 3/5    Baseline strength is 2/5    Time 12    Period Weeks    Status On-going                 Plan - 06/23/20 1027    Clinical Impression Statement Patient is able to fully empty her bladder now. Patient has negative compression test on the left SI but the pain is more in the hip. Patient has not had urinary leakage since the last 2 visits but has had an almost time when she sneezed. Patient pelvis in correct alignment after manual work. She has tight quadricep and psoas. Patient improve bilateral hip abduction and adduction. Patient reports she has the same intensity of cramps but not as often. Patient continues to need core strength and stability to assit with her pain.  Patient will benefit from skilled therapy to improve pelvic floor coordination and strength.    Personal Factors and Comorbidities Age;Fitness;Profession    Examination-Activity Limitations Continence    Examination-Participation Restrictions Community Activity;Interpersonal Relationship    Stability/Clinical Decision Making Stable/Uncomplicated    Rehab  Potential Excellent    PT Frequency 1x / week    PT Duration 12 weeks    PT Treatment/Interventions ADLs/Self Care Home Management;Biofeedback;Cryotherapy;Moist Heat;Ultrasound;Neuromuscular re-education;Therapeutic  exercise;Therapeutic activities;Patient/family education;Manual techniques;Scar mobilization;Dry needling;Spinal Manipulations    PT Next Visit Plan work on core strength; manual work to the abdominal region and pelvic floor; work on pelvic floor strength.    PT Home Exercise Plan Access Code: [email or web address]    Consulted and Agree with Plan of Care Patient           Patient will benefit from skilled therapeutic intervention in order to improve the following deficits and impairments:  Decreased coordination,Increased fascial restricitons,Pain,Decreased endurance,Decreased activity tolerance,Decreased strength,Increased muscle spasms  Visit Diagnosis: Muscle weakness (generalized)  Other lack of coordination  Stress incontinence (female) (female)     Problem List Patient Active Problem List   Diagnosis Date Noted  . Major depressive disorder, recurrent severe without psychotic features (HCC) 09/07/2016  . Anxiety 03/29/2015  . Allergy to multiple antibiotics--Rocephin, Cefzil 03/29/2015    Dierdre Mccalip 06/23/2020, 11:02 AM  [facility] [address], [address] [place], [place], [address] Phone: [phone]   Fax:  [phone]  Name: Diane [name] MRN: [id number] Date of Birth: [birth year]

## 2020-07-09 ENCOUNTER — Other Ambulatory Visit: Payer: Self-pay

## 2020-07-09 ENCOUNTER — Encounter: Payer: Self-pay | Admitting: Physical Therapy

## 2020-07-09 ENCOUNTER — Ambulatory Visit: Payer: Medicaid Other | Attending: Obstetrics & Gynecology | Admitting: Physical Therapy

## 2020-07-09 DIAGNOSIS — N393 Stress incontinence (female) (male): Secondary | ICD-10-CM | POA: Diagnosis present

## 2020-07-09 DIAGNOSIS — R278 Other lack of coordination: Secondary | ICD-10-CM

## 2020-07-09 DIAGNOSIS — M6281 Muscle weakness (generalized): Secondary | ICD-10-CM

## 2020-07-09 NOTE — Therapy (Signed)
[facility] [address] [place], [place], [address] Phone: [phone]   Fax:  [phone]  Physical Therapy Treatment  Patient Details  Name: Diane [name] MRN: [id number] Date of Birth: January 07, 1996 Referring Provider (PT): Dr. Hoover Browns   Encounter Date: 07/09/2020   PT End of Session - 07/09/20 1244    Visit Number 4    Date for PT Re-Evaluation 07/24/20    Authorization Type [employer]    Authorization Time Period 3/3-7/1    Authorization - Visit Number 1    Authorization - Number of Visits 4    PT Start Time 1240    PT Stop Time 1318    PT Time Calculation (min) 38 min    Activity Tolerance Patient tolerated treatment well    Behavior During Therapy [facility] for tasks assessed/performed           Past Medical History:  Diagnosis Date  . Anemia   . Anxiety   . Headache   . UTI (lower urinary tract infection)     History reviewed. No pertinent surgical history.  There were no vitals filed for this visit.   Subjective Assessment - 07/09/20 1241    Subjective When laughing I get a bac pain in the lower abdominal area. No urinary leakage. Able to empty her bladder.    Patient Stated Goals reduce urinary leakage    Currently in Pain? Yes    Pain Score 9     Pain Location Abdomen    Pain Descriptors / Indicators Cramping    Pain Type Acute pain    Pain Onset More than a month ago    Pain Frequency Intermittent    Aggravating Factors  when laughs or cough too hard    Pain Relieving Factors sit down and squat    Multiple Pain Sites No              [facility] PT Assessment - 07/09/20 0001      Assessment   Medical Diagnosis N39.3 stress incontinence    Referring Provider (PT) Dr. Hoover Browns    Prior Therapy none      Precautions   Precautions None      Restrictions   Weight Bearing Restrictions No      Prior Function   Level of Independence Independent    Vocation Full time employment     Vocation Requirements standing, lifting, climbing    Leisure none      Cognition   Overall Cognitive Status Within Functional Limits for tasks assessed      Posture/Postural Control   Posture/Postural Control No significant limitations      Strength   Right Hip ABduction 4/5    Left Hip ABduction 4/5      Palpation   SI assessment  ASIS    Palpation comment tenderness located superior to pubic bone      Pelvic Compression   Findings Negative    Side Left    comment pain in the left hip not the SI joint                         [facility] Adult PT Treatment/Exercise - 07/09/20 0001      Lumbar Exercises: Supine   Ab Set 10 reps;5 seconds    AB Set Limitations tactile cues to contract the lower and upper abdominals and breath    Bent Knee Raise 10 reps;1 second  Bent Knee Raise Limitations each side with tactile cues to contract the abdoinals    Other Supine Lumbar Exercises alternate shoulder flexion with abdominal bracing 10x each side      Manual Therapy   Manual Therapy Myofascial release    Myofascial Release supra pubically release the fascia in several directions and lifting the intestines off the bladder, release around the bladder, release of the pubovesicle ligaments, using the suction cup to pull the skin off the muscle in the suprapubic area                  PT Education - 07/09/20 1321    Education Details Access Code: [email or web address]    Person(s) Educated Patient    Methods Explanation;Demonstration;Verbal cues;Handout    Comprehension Returned demonstration;Verbalized understanding            PT Short Term Goals - 05/30/20 0928      PT SHORT TERM GOAL #1   Title be independent in initial HEP    Time 4    Period Weeks    Status Achieved             PT Long Term Goals - 07/09/20 1332      PT LONG TERM GOAL #1   Title be independent in advanced HEP    Baseline still learning    Time 12    Period Weeks    Status On-going       PT LONG TERM GOAL #2   Title pelvic pain decreased </= 0-1 due to improve pelvic floor tissue mobility    Baseline pain level is 9/10 but not as often when she laughs or coughs hard    Time 12    Period Weeks    Status On-going      PT LONG TERM GOAL #3   Title able to walk to the commode when she has the urge without leaking due to improve pelvic floor strength >/= 3/5    Time 12    Period Weeks    Status Achieved      PT LONG TERM GOAL #4   Title urinary leakage with coughing, sneezing, and laughing decreased >/= 75% due to improve pelvic floor strength >/= 3/5    Time 12    Period Weeks    Status Achieved                 Plan - 07/09/20 1246    Clinical Impression Statement Patient reports no urinary leakage since last visit. Patient is able to fully empty her bladder. When she coughs she has lower abdominal pain at level 9/10. Patient has weak abdominals. She has increased fascial tension of the suprapubic bone area. Patient She ahs tight quadricep and psoas. Patient has increased strength of bilateral hips. Pelvic floor strength is 3/5. Patient will benefit from skilled therapy to improve pelvic floor coordination and strength.    Personal Factors and Comorbidities Age;Fitness;Profession    Examination-Activity Limitations Continence    Examination-Participation Restrictions Community Activity;Interpersonal Relationship    Stability/Clinical Decision Making Stable/Uncomplicated    Rehab Potential Excellent    PT Frequency 1x / week    PT Duration 12 weeks    PT Treatment/Interventions ADLs/Self Care Home Management;Biofeedback;Cryotherapy;Moist Heat;Ultrasound;Neuromuscular re-education;Therapeutic exercise;Therapeutic activities;Patient/family education;Manual techniques;Scar mobilization;Dry needling;Spinal Manipulations    PT Next Visit Plan work on core strength adding standing, manual work to the abdominal region suprapubically; write md renewal    PT Home Exercise  Plan Access Code: [email or web address]  Consulted and Agree with Plan of Care Patient           Patient will benefit from skilled therapeutic intervention in order to improve the following deficits and impairments:  Decreased coordination,Increased fascial restricitons,Pain,Decreased endurance,Decreased activity tolerance,Decreased strength,Increased muscle spasms  Visit Diagnosis: Muscle weakness (generalized)  Other lack of coordination  Stress incontinence (female) (female)     Problem List Patient Active Problem List   Diagnosis Date Noted  . Major depressive disorder, recurrent severe without psychotic features (HCC) 09/07/2016  . Anxiety 03/29/2015  . Allergy to multiple antibiotics--Rocephin, Cefzil 03/29/2015    Eulis Foster, PT 07/09/20 1:34 PM   [facility] [address] [place], [place], [address] Phone: [phone]   Fax:  [phone]  Name: Diane [name] MRN: [id number] Date of Birth: 1995-11-28

## 2020-07-09 NOTE — Patient Instructions (Signed)
Access Code: [email or web address] URL: [email or web address] Date: 07/09/2020 Prepared by: Eulis Foster  Program Notes massage the back of the vaginal canal gently to improve blood flow   Exercises Seated Pelvic Floor Contraction - 3 x daily - 7 x weekly - 1 sets - 5 reps - 5 hold Supine Piriformis Stretch with Leg Straight - 1 x daily - 7 x weekly - 1 sets - 2 reps - 30 sec hold Supine Figure 4 Piriformis Stretch with Leg Extension - 1 x daily - 7 x weekly - 1 sets - 2 reps - 30 sec hold Supine Pelvic Floor Stretch - 1 x daily - 7 x weekly - 1 sets - 1 reps - 1 min hold Half Kneeling Hip Flexor Stretch with Sidebend - 1 x daily - 7 x weekly - 1 sets - 2 reps Sidelying Reverse Clamshell - 1 x daily - 7 x weekly - 1 sets - 10 reps - 1 sec hold Clamshell - 1 x daily - 7 x weekly - 1 sets - 15 reps Bridge with Hip Abduction and Resistance - 1 x daily - 7 x weekly - 1 sets - 15 reps Supine Transversus Abdominis Bracing - Hands on Stomach - 1 x daily - 7 x weekly - 1 sets - 10 reps - 5 sec hold Hooklying Sequential Leg March and Lower - 1 x daily - 7 x weekly - 1 sets - 10 reps Supine Alternating Shoulder Flexion - 1 x daily - 7 x weekly - 2 sets - 10 reps Quadruped Pelvic Floor Contraction with Opposite Arm and Leg Lift - 1 x daily - 7 x weekly - 1 sets - 10 reps [facility] [address] [place], [place] [address] Phone # [phone] Fax [phone]

## 2020-07-21 ENCOUNTER — Ambulatory Visit: Payer: Medicaid Other | Admitting: Physical Therapy

## 2020-07-21 ENCOUNTER — Other Ambulatory Visit: Payer: Self-pay

## 2020-07-21 ENCOUNTER — Encounter: Payer: Self-pay | Admitting: Physical Therapy

## 2020-07-21 DIAGNOSIS — M6281 Muscle weakness (generalized): Secondary | ICD-10-CM | POA: Diagnosis not present

## 2020-07-21 DIAGNOSIS — R278 Other lack of coordination: Secondary | ICD-10-CM

## 2020-07-21 DIAGNOSIS — N393 Stress incontinence (female) (male): Secondary | ICD-10-CM

## 2020-07-21 NOTE — Therapy (Signed)
[facility] [address] [place], [place], [address] Phone: [phone]   Fax:  [phone]  Physical Therapy Treatment  Patient Details  Name: Diane [name] MRN: [id number] Date of Birth: 02/19/1996 Referring Provider (PT): Dr. Waymon Amato   Encounter Date: 07/21/2020   PT End of Session - 07/21/20 0944    Visit Number 5    Date for PT Re-Evaluation 07/24/20    Authorization Type [employer]    Authorization Time Period 3/3-7/1    PT Start Time 0930    PT Stop Time 1010    PT Time Calculation (min) 40 min    Activity Tolerance Patient tolerated treatment well    Behavior During Therapy [facility] for tasks assessed/performed           Past Medical History:  Diagnosis Date  . Anemia   . Anxiety   . Headache   . UTI (lower urinary tract infection)     History reviewed. No pertinent surgical history.  There were no vitals filed for this visit.   Subjective Assessment - 07/21/20 0935    Subjective I have the random pain with coughing and laughing in sitting in the groin area. No urinary leakage.    Patient Stated Goals reduce urinary leakage    Currently in Pain? Yes    Pain Score 7     Pain Location Abdomen    Pain Orientation Lower;Right;Left    Pain Descriptors / Indicators Cramping    Pain Type Acute pain    Pain Onset More than a month ago    Pain Frequency Intermittent    Aggravating Factors  when laugh or cough too hard in sitting    Pain Relieving Factors pressure and the pain is quick              [facility] PT Assessment - 07/21/20 0001      Assessment   Medical Diagnosis N39.3 stress incontinence    Referring Provider (PT) Dr. Waymon Amato    Prior Therapy none      Precautions   Precautions None      Restrictions   Weight Bearing Restrictions No      Prior Function   Level of Independence Independent    Vocation Full time employment    Vocation Requirements standing, lifting, climbing     Leisure none      Cognition   Overall Cognitive Status Within Functional Limits for tasks assessed      Posture/Postural Control   Posture/Postural Control No significant limitations      Strength   Right Hip ABduction 4+/5    Left Hip ABduction 4+/5      Palpation   SI assessment  ASIS are equal                      Pelvic Floor Special Questions - 07/21/20 0001    Prior Pregnancies Yes    Number of Pregnancies 2    Number of Vaginal Deliveries 2   3rd degree tear for both   Diastasis Recti 1 fingers width above umbilicus, 1 finger width below umbilicus    Currently Sexually Active Yes    Is this Painful Yes   sometimes with pain level 3-4/10 and switch positions   Marinoff Scale discomfort that does not affect completion    Pelvic Floor Internal Exam Patient confirms identification and approves PT to assess pelvic floor and treatment    Exam Type Vaginal  Strength fair squeeze, definite lift             [facility] Adult PT Treatment/Exercise - 07/21/20 0001      Manual Therapy   Manual Therapy Soft tissue mobilization;Internal Pelvic Floor    Soft tissue mobilization manual work to the hip adductors, along the sides of the external introitus, along the perineal body    Internal Pelvic Floor bulbocavernosus, ischiocavernsous, perineal body along                    PT Short Term Goals - 05/30/20 8295      PT SHORT TERM GOAL #1   Title be independent in initial HEP    Time 4    Period Weeks    Status Achieved             PT Long Term Goals - 07/21/20 6213      PT LONG TERM GOAL #1   Title be independent in advanced HEP    Time 12    Period Weeks    Status Achieved      PT LONG TERM GOAL #2   Title pelvic pain decreased </= 0-1 due to improve pelvic floor tissue mobility    Baseline pain level is 7/10 but not as often when she laughs or coughs hard    Time 12    Period Weeks    Status Partially Met      PT LONG TERM GOAL #3    Title able to walk to the commode when she has the urge without leaking due to improve pelvic floor strength >/= 3/5    Time 12    Period Weeks    Status Achieved      PT LONG TERM GOAL #4   Title urinary leakage with coughing, sneezing, and laughing decreased >/= 75% due to improve pelvic floor strength >/= 3/5    Time 12    Period Weeks    Status Achieved                 Plan - 07/21/20 0944    Clinical Impression Statement Patient is not having urinary leakage. She is able to fully empty her bladder. She has torn a little with intercourse and had some pain. Today I release the restrictions of the soft tissue to reduce this. Pelvic floor strength is 3/5. She has increased strength of the hips. Her ASIS are equal. She is able to walk to the commode without leaking urine. Patient reports intermittient cramp that is sharp for 3 sec in the lower abdominal when she coughs or laughs in sitting. Patient is independent with her HEP.    Personal Factors and Comorbidities Age;Fitness;Profession    Examination-Activity Limitations Continence    Examination-Participation Restrictions Community Activity;Interpersonal Relationship    Stability/Clinical Decision Making Stable/Uncomplicated    Rehab Potential Excellent    PT Treatment/Interventions ADLs/Self Care Home Management;Biofeedback;Cryotherapy;Moist Heat;Ultrasound;Neuromuscular re-education;Therapeutic exercise;Therapeutic activities;Patient/family education;Manual techniques;Scar mobilization;Dry needling;Spinal Manipulations    PT Next Visit Plan Discharge to HEP    PT Home Exercise Plan Access Code: [email or web address]    Recommended Other Services MD signed initial eval    Consulted and Agree with Plan of Care Patient           Patient will benefit from skilled therapeutic intervention in order to improve the following deficits and impairments:  Decreased coordination,Increased fascial restricitons,Pain,Decreased endurance,Decreased  activity tolerance,Decreased strength,Increased muscle spasms  Visit Diagnosis: Muscle weakness (generalized)  Other lack of coordination  Stress incontinence (female) (female)     Problem List Patient Active Problem List   Diagnosis Date Noted  . Major depressive disorder, recurrent severe without psychotic features ([place]) 09/07/2016  . Anxiety 03/29/2015  . 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, [place], [place], [address] Phone: [phone]   Fax:  [phone]  Name: Diane [name] MRN: [id number] Date of Birth: 1995/06/28  PHYSICAL THERAPY DISCHARGE SUMMARY  Visits from Start of Care: 5  Current functional level related to goals / functional outcomes: See above.   Remaining deficits: See above.    Education / Equipment: HEP Plan: Patient agrees to discharge.  Patient goals were partially met. Patient is being discharged due to being pleased with the current functional level.  Thank you for the referral. Earlie Counts, PT 07/21/20 10:59 AM  ?????

## 2020-08-29 ENCOUNTER — Ambulatory Visit: Payer: Medicaid Other | Admitting: Physical Therapy

## 2020-09-12 ENCOUNTER — Encounter: Payer: Medicaid Other | Admitting: Physical Therapy

## 2020-10-06 ENCOUNTER — Other Ambulatory Visit: Payer: Self-pay

## 2020-10-06 ENCOUNTER — Ambulatory Visit
Admission: EM | Admit: 2020-10-06 | Discharge: 2020-10-06 | Disposition: A | Discharge status: Home / Self Care | Payer: Medicaid Other | Attending: Family Medicine | Admitting: Family Medicine

## 2020-10-06 DIAGNOSIS — J039 Acute tonsillitis, unspecified: Secondary | ICD-10-CM | POA: Diagnosis not present

## 2020-10-06 LAB — POCT RAPID STREP A (OFFICE): Rapid Strep A Screen: NEGATIVE

## 2020-10-06 MED ORDER — AMOXICILLIN 875 MG PO TABS: 875.0000 mg | ORAL_TABLET | Freq: Two times a day (BID) | ORAL | 0 refills | Status: AC

## 2020-10-06 MED ORDER — FLUCONAZOLE 150 MG PO TABS: 150.0000 mg | ORAL_TABLET | Freq: Every day | ORAL | 0 refills | Status: DC

## 2020-10-06 NOTE — ED Triage Notes (Signed)
Sore throat since last night.  Hx of strep infections.  States she sees white patches in her throat,

## 2020-10-09 LAB — CULTURE, GROUP A STREP (THRC)

## 2021-03-19 ENCOUNTER — Encounter: Payer: Self-pay | Admitting: Emergency Medicine

## 2021-03-19 ENCOUNTER — Other Ambulatory Visit: Payer: Self-pay

## 2021-03-19 ENCOUNTER — Ambulatory Visit
Admission: EM | Admit: 2021-03-19 | Discharge: 2021-03-19 | Disposition: A | Discharge status: Home / Self Care | Payer: Medicaid Other | Attending: Family Medicine | Admitting: Family Medicine

## 2021-03-19 DIAGNOSIS — T7840XA Allergy, unspecified, initial encounter: Secondary | ICD-10-CM

## 2021-03-19 DIAGNOSIS — S50861A Insect bite (nonvenomous) of right forearm, initial encounter: Secondary | ICD-10-CM

## 2021-03-19 DIAGNOSIS — W57XXXA Bitten or stung by nonvenomous insect and other nonvenomous arthropods, initial encounter: Secondary | ICD-10-CM | POA: Diagnosis not present

## 2021-03-19 DIAGNOSIS — M7989 Other specified soft tissue disorders: Secondary | ICD-10-CM | POA: Diagnosis not present

## 2021-03-19 MED ORDER — DOXYCYCLINE HYCLATE 100 MG PO CAPS: 100.0000 mg | ORAL_CAPSULE | Freq: Two times a day (BID) | ORAL | 0 refills | Status: DC

## 2021-03-19 MED ORDER — METHYLPREDNISOLONE SODIUM SUCC 125 MG IJ SOLR
80.0000 mg | Freq: Once | INTRAMUSCULAR | Status: AC
  Administered 2021-03-19: 80 mg via INTRAMUSCULAR

## 2021-03-19 MED ORDER — FLUCONAZOLE 150 MG PO TABS
150.0000 mg | ORAL_TABLET | ORAL | 0 refills | Status: DC
Start: 2021-03-19 — End: 2021-08-04

## 2021-03-19 MED ORDER — CETIRIZINE HCL 10 MG PO TABS: 10.0000 mg | ORAL_TABLET | Freq: Two times a day (BID) | ORAL | 0 refills | Status: DC

## 2021-03-19 MED ORDER — TRIAMCINOLONE ACETONIDE 0.1 % EX CREA: 1.0000 "application " | TOPICAL_CREAM | Freq: Two times a day (BID) | CUTANEOUS | 0 refills | Status: DC

## 2021-03-19 NOTE — Discharge Instructions (Signed)
Give the solumedrol and antihistamine about 24 hours to see if this begins resolving/improving your swelling and redness. If worsening or not improving in any way start the antibiotic

## 2021-03-19 NOTE — ED Provider Notes (Signed)
[facility]    CSN: [id number] Arrival date & time: 03/19/21  0815      History   Chief Complaint No chief complaint on file.   HPI Diane [name] is a 26 y.o. female.   Presenting today with 2 day history of progressively worsening right forearm redness, swelling, itching, pain after some bug bites she received overnight while sleeping.  She is unsure of what bit her as she did not see anything, just woke up with the bites and swelling and redness.  She states she is incredibly allergic to insect bites and does tend to get a reaction though never this significant with them.  Has been trying Benadryl cream, hydrocortisone, ice with minimal relief and area continues to get larger and more swollen.  Painful to move her wrist and fingers due to the amount of swelling in the forearm.  Not trying any oral medications for symptoms thus far.  She denies chest pain, chest tightness, wheezing, shortness of breath, vomiting, diarrhea, throat swelling or itching.     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 No pertinent surgical history.  OB History     Gravida  2   Para  2   Term  2   Preterm      AB      Living  2      SAB      IAB      Ectopic      Multiple  0   Live Births  2            Home Medications    Prior to Admission medications   Medication Sig Start Date End Date Taking? Authorizing Provider  cetirizine (ZYRTEC ALLERGY) 10 MG tablet Take 1 tablet (10 mg total) by mouth 2 (two) times daily. 03/19/21  Yes Particia NearingLane, Lurena Naeve Elizabeth, PA-C  doxycycline (VIBRAMYCIN) 100 MG capsule Take 1 capsule (100 mg total) by mouth 2 (two)  times daily. 03/19/21  Yes Particia NearingLane, Atwell Mcdanel Elizabeth, PA-C  triamcinolone cream (KENALOG) 0.1 % Apply 1 application topically 2 (two) times daily. 03/19/21  Yes Particia NearingLane, Dalary Hollar Elizabeth, PA-C  acetaminophen (TYLENOL) 500 MG tablet Take 1,000 mg by mouth every 6 (six) hours as needed for mild pain, moderate pain, fever or headache.     [provider]  cholecalciferol (VITAMIN D3) 25 MCG (1000 UNIT) tablet Take 1,000 Units by mouth daily.    [provider]  dicyclomine (BENTYL) 20 MG tablet Take 1 tablet (20 mg total) by mouth 2 (two) times daily. 06/19/20   Wurst, [place], PA-C  Drospirenone (SLYND) 4 MG TABS Take by mouth.    [provider]  fluconazole (DIFLUCAN) 150 MG tablet Take 1 tablet (150 mg total) by mouth once a week. 03/19/21   Particia NearingLane, Jazma Pickel Elizabeth, PA-C  Hypromellose (GENTEAL MILD) 0.2 % SOLN Use as directed for eye irritation 06/19/20   Wurst, [place], PA-C  ondansetron (ZOFRAN) 4 MG tablet Take 1 tablet (4 mg total) by mouth every 6 (six) hours. 06/19/20   Wurst, [place], PA-C  norethindrone (ORTHO MICRONOR) 0.35 MG tablet Take 1 tablet (0.35 mg total) by mouth daily.  11/09/16 01/24/19  Lennox Solders, MD  sertraline (ZOLOFT) 100 MG tablet Take 1 tablet (100 mg total) by mouth at bedtime. 12/15/16 01/24/19  Constant, Peggy, MD    Family History Family History  Problem Relation Age of Onset   Hypertension Mother    Hypertension Maternal Grandmother    Diabetes Maternal Grandmother    Congestive Heart Failure Maternal Grandmother    Hypertension Maternal Uncle    Diabetes Maternal Uncle    Hypertension Maternal Uncle    Diabetes Maternal Uncle     Social History Social History   Tobacco Use   Smoking status: Never   Smokeless tobacco: Never  Vaping Use   Vaping Use: Never used  Substance Use Topics   Alcohol use: No   Drug use: No   Allergies   Cefzil [cefprozil] and Rocephin [ceftriaxone sodium in dextrose]  Review of Systems Review of  Systems Per HPI  Physical Exam Triage Vital Signs ED Triage Vitals  Enc Vitals Group     BP 03/19/21 0839 113/78     Pulse Rate 03/19/21 0839 82     Resp 03/19/21 0839 18     Temp 03/19/21 0839 99 F (37.2 C)     Temp Source 03/19/21 0839 Oral     SpO2 03/19/21 0839 98 %     Weight --      Height --      Head Circumference --      Peak Flow --      Pain Score 03/19/21 0841 4     Pain Loc --      Pain Edu? --      Excl. in GC? --    No data found.  Updated Vital Signs BP 113/78 (BP Location: Right Arm)    Pulse 82    Temp 99 F (37.2 C) (Oral)    Resp 18    LMP 01/21/2021 (Approximate)    SpO2 98%   Visual Acuity Right Eye Distance:   Left Eye Distance:   Bilateral Distance:    Right Eye Near:   Left Eye Near:    Bilateral Near:     Physical Exam Vitals and nursing note reviewed.  Constitutional:      Appearance: Normal appearance. She is not ill-appearing.  HENT:     Head: Atraumatic.     Mouth/Throat:     Mouth: Mucous membranes are moist.     Pharynx: No oropharyngeal exudate or posterior oropharyngeal erythema.     Comments: Oral airway patent Eyes:     Extraocular Movements: Extraocular movements intact.     Conjunctiva/sclera: Conjunctivae normal.  Cardiovascular:     Rate and Rhythm: Normal rate and regular rhythm.     Heart sounds: Normal heart sounds.  Pulmonary:     Effort: Pulmonary effort is normal.     Breath sounds: Normal breath sounds. No wheezing or rales.  Abdominal:     General: Bowel sounds are normal. There is no distension.     Palpations: Abdomen is soft.     Tenderness: There is no abdominal tenderness. There is no guarding.  Musculoskeletal:        General: Normal range of motion.     Cervical back: Normal range of motion and neck supple.  Skin:    General: Skin is warm.     Findings: Erythema present.     Comments: Large area from right wrist to almost right elbow of raised, edematous, erythematous maculopapular region.   Small  area of induration near a visible insect bite at wrist.  Range of motion intact but stiff and painful due to the amount of swelling.  Small amount of streaking coming up flexural surface to elbow past the border of the maculopapular lesion  Neurological:     Mental Status: She is alert and oriented to person, place, and time.     Motor: No weakness.     Gait: Gait normal.  Psychiatric:        Mood and Affect: Mood normal.        Thought Content: Thought content normal.        Judgment: Judgment normal.     UC Treatments / Results  Labs (all labs ordered are listed, but only abnormal results are displayed) Labs Reviewed - No data to display  EKG   Radiology No results found.  Procedures Procedures (including critical care time)  Medications Ordered in UC Medications  methylPREDNISolone sodium succinate (SOLU-MEDROL) 125 mg/2 mL injection 80 mg (80 mg Intramuscular Given 03/19/21 0917)    Initial Impression / Assessment and Plan / UC Course  I have reviewed the triage vital signs and the nursing notes.  Pertinent labs & imaging results that were available during my care of the patient were reviewed by me and considered in my medical decision making (see chart for details).     IM Solu-Medrol administered in clinic today, Zyrtec twice daily, triamcinolone cream.  We will also send doxycycline as certain features do concern me for an early cellulitis secondary to the insect bite on top of an allergic reaction.  She request Diflucan to be sent with the antibiotic in case of a vaginal yeast infection.  Discussed to give the Solu-Medrol and antihistamines about 24 hours to see if this will improve symptoms without the antibiotic but to start the antibiotic if not improving or worsening.  Return for any acutely worsening symptoms.  Final Clinical Impressions(s) / UC Diagnoses   Final diagnoses:  Allergic reaction, initial encounter  Swelling of arm  Insect bite of right  forearm, initial encounter     Discharge Instructions      Give the solumedrol and antihistamine about 24 hours to see if this begins resolving/improving your swelling and redness. If worsening or not improving in any way start the antibiotic    ED Prescriptions     Medication Sig Dispense Auth. Provider   cetirizine (ZYRTEC ALLERGY) 10 MG tablet Take 1 tablet (10 mg total) by mouth 2 (two) times daily. 60 tablet Particia Nearing, [place]   fluconazole (DIFLUCAN) 150 MG tablet Take 1 tablet (150 mg total) by mouth once a week. 2 tablet Particia Nearing, [place]   doxycycline (VIBRAMYCIN) 100 MG capsule Take 1 capsule (100 mg total) by mouth 2 (two) times daily. 14 capsule Particia Nearing, [place]   triamcinolone cream (KENALOG) 0.1 % Apply 1 application topically 2 (two) times daily. 80 g Particia Nearing, [place]      PDMP not reviewed this encounter.   Particia Nearing, [place] 03/19/21 1339

## 2021-03-19 NOTE — ED Triage Notes (Signed)
Right lower arm redness and swelling.  States area itches. Warm to the touch.  Itchiness started during the night 2 days ago.  Has tried itch cream with very little relief.

## 2021-03-31 ENCOUNTER — Other Ambulatory Visit: Payer: Self-pay | Admitting: Obstetrics and Gynecology

## 2021-03-31 DIAGNOSIS — E049 Nontoxic goiter, unspecified: Secondary | ICD-10-CM

## 2021-04-10 ENCOUNTER — Ambulatory Visit
Admission: RE | Admit: 2021-04-10 | Discharge: 2021-04-10 | Disposition: A | Discharge status: Home / Self Care | Payer: Medicaid Other | Source: Ambulatory Visit | Attending: Obstetrics and Gynecology | Admitting: Obstetrics and Gynecology

## 2021-04-10 ENCOUNTER — Other Ambulatory Visit: Payer: Self-pay | Admitting: Family Medicine

## 2021-04-10 DIAGNOSIS — E049 Nontoxic goiter, unspecified: Secondary | ICD-10-CM

## 2021-04-11 NOTE — Telephone Encounter (Signed)
PCP listed as Dr. Dimas Aguas. Requested Prescriptions  Pending Prescriptions Disp Refills   cetirizine (ZYRTEC) 10 MG tablet [Pharmacy Med Name: CETIRIZINE HCL 10 MG TABLET] 60 tablet 0    Sig: TAKE 1 TABLET BY MOUTH TWICE A DAY     Ear, Nose, and Throat:  Antihistamines 2 Failed - 04/10/2021 11:33 AM      Failed - Cr in normal range and within 360 days    Creatinine, Ser  Date Value Ref Range Status  08/21/2017 0.60 0.44 - 1.00 mg/dL Final         Failed - Valid encounter within last 12 months    Recent Outpatient Visits   None

## 2021-04-13 NOTE — Telephone Encounter (Signed)
Not a patient of [facility].

## 2021-07-13 ENCOUNTER — Other Ambulatory Visit: Payer: Self-pay | Admitting: Obstetrics and Gynecology

## 2021-08-04 ENCOUNTER — Ambulatory Visit
Admission: EM | Admit: 2021-08-04 | Discharge: 2021-08-04 | Disposition: A | Discharge status: Home / Self Care | Payer: Medicaid Other | Attending: Nurse Practitioner | Admitting: Nurse Practitioner

## 2021-08-04 ENCOUNTER — Encounter: Payer: Self-pay | Admitting: Emergency Medicine

## 2021-08-04 DIAGNOSIS — N898 Other specified noninflammatory disorders of vagina: Secondary | ICD-10-CM

## 2021-08-04 DIAGNOSIS — Z113 Encounter for screening for infections with a predominantly sexual mode of transmission: Secondary | ICD-10-CM

## 2021-08-04 LAB — POCT URINALYSIS DIP (MANUAL ENTRY)
Bilirubin, UA: NEGATIVE
Glucose, UA: NEGATIVE mg/dL
Ketones, POC UA: NEGATIVE mg/dL
Leukocytes, UA: NEGATIVE
Nitrite, UA: NEGATIVE
Protein Ur, POC: NEGATIVE mg/dL
Spec Grav, UA: >=1.03 — AB (ref 1.010–1.025)
Urobilinogen, UA: 0.2 E.U./dL
pH, UA: 5.5 (ref 5.0–8.0)

## 2021-08-04 LAB — POCT URINE PREGNANCY: Preg Test, Ur: NEGATIVE

## 2021-08-04 NOTE — ED Triage Notes (Signed)
Vaginal discharge with odor.  White discharge with itchiness 2 to 3 days.

## 2021-08-04 NOTE — ED Provider Notes (Signed)
[facility]    CSN: [id number] Arrival date & time: 08/04/21  1423      History   Chief Complaint No chief complaint on file.   HPI Diane [name] is a 26 y.o. female.   Patient presents today for vaginal discharge that has been present for the past couple of days.  She reports it is white, and itchy at times.  She denies any urinary symptoms including dysuria, urinary frequency, urgency, blood in her urine, abdominal pain, back pain, fevers, nausea/vomiting.  Patient denies any rashes, sores, or open areas on her genitalia and denies any swelling in her groin.  She is sexually active.   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 No pertinent surgical history.  OB History     Gravida  2   Para  2   Term  2   Preterm      AB      Living  2      SAB      IAB      Ectopic      Multiple  0   Live Births  2            Home Medications    Prior to Admission medications   Medication Sig Start Date End Date Taking? Authorizing Provider  acetaminophen (TYLENOL) 500 MG tablet Take 1,000 mg by mouth every 6 (six) hours as needed for mild pain, moderate pain, fever or headache.     [provider]  cetirizine (ZYRTEC ALLERGY) 10 MG tablet Take 1 tablet (10 mg total) by mouth 2 (two) times daily. 03/19/21   Particia NearingLane, Rachel Elizabeth, PA-C  cholecalciferol (VITAMIN D3) 25 MCG (1000 UNIT) tablet Take 1,000 Units by mouth daily.    [provider]  dicyclomine (BENTYL) 20 MG tablet Take 1 tablet (20 mg total) by mouth 2 (two) times daily. 06/19/20   Wurst, [place], PA-C  Drospirenone (SLYND) 4 MG TABS Take by mouth.     [provider]  Hypromellose (GENTEAL MILD) 0.2 % SOLN Use as directed for eye irritation 06/19/20   Wurst, [place], PA-C  ondansetron (ZOFRAN) 4 MG tablet Take 1 tablet (4 mg total) by mouth every 6 (six) hours. 06/19/20   Wurst, [place], PA-C  triamcinolone cream (KENALOG) 0.1 % Apply 1 application topically 2 (two) times daily. 03/19/21   Particia NearingLane, Rachel Elizabeth, PA-C  norethindrone (ORTHO MICRONOR) 0.35 MG tablet Take 1 tablet (0.35 mg total) by mouth daily. 11/09/16 01/24/19  Lennox SoldersWinfrey, Amanda C, MD  sertraline (ZOLOFT) 100 MG tablet Take 1 tablet (100 mg total) by mouth at bedtime. 12/15/16 01/24/19  Constant, Peggy, MD    Family History Family History  Problem Relation Age of Onset   Hypertension Mother    Hypertension Maternal Grandmother    Diabetes Maternal Grandmother    Congestive Heart Failure Maternal Grandmother    Hypertension Maternal Uncle    Diabetes Maternal Uncle    Hypertension Maternal Uncle    Diabetes Maternal Uncle     Social History Social History   Tobacco Use   Smoking status: Never   Smokeless tobacco: Never  Vaping Use  Vaping Use: Never used  Substance Use Topics   Alcohol use: No   Drug use: No     Allergies   Cefzil [cefprozil] and Rocephin [ceftriaxone sodium in dextrose]   Review of Systems Review of Systems Per HPI  Physical Exam Triage Vital Signs ED Triage Vitals  Enc Vitals Group     BP 08/04/21 1459 121/78     Pulse Rate 08/04/21 1459 89     Resp 08/04/21 1459 17     Temp 08/04/21 1459 98.7 F (37.1 C)     Temp Source 08/04/21 1459 Oral     SpO2 08/04/21 1459 97 %     Weight --      Height --      Head Circumference --      Peak Flow --      Pain Score 08/04/21 1506 0     Pain Loc --      Pain Edu? --      Excl. in GC? --    No data found.  Updated Vital Signs BP 121/78 (BP Location: Right Arm)   Pulse 89   Temp 98.7 F (37.1 C) (Oral)   Resp 17   SpO2 97%   Visual Acuity Right Eye Distance:    Left Eye Distance:   Bilateral Distance:    Right Eye Near:   Left Eye Near:    Bilateral Near:     Physical Exam Vitals and nursing note reviewed.  Constitutional:      General: She is not in acute distress.    Appearance: Normal appearance. She is not toxic-appearing.  Pulmonary:     Effort: Pulmonary effort is normal. No respiratory distress.  Genitourinary:    Comments: Deferred Skin:    General: Skin is warm and dry.     Coloration: Skin is not jaundiced or pale.     Findings: No erythema.  Neurological:     Mental Status: She is alert and oriented to person, place, and time.     Motor: No weakness.     Gait: Gait normal.  Psychiatric:        Mood and Affect: Mood normal.        Behavior: Behavior is cooperative.     UC Treatments / Results  Labs (all labs ordered are listed, but only abnormal results are displayed) Labs Reviewed  POCT URINALYSIS DIP (MANUAL ENTRY) - Abnormal; Notable for the following components:      Result Value   Clarity, UA hazy (*)    Spec Grav, UA >=1.030 (*)    Blood, UA trace-intact (*)    All other components within normal limits  HIV ANTIBODY (ROUTINE TESTING W REFLEX)  RPR  POCT URINE PREGNANCY  CERVICOVAGINAL ANCILLARY ONLY    EKG   Radiology No results found.  Procedures Procedures (including critical care time)  Medications Ordered in UC Medications - No data to display  Initial Impression / Assessment and Plan / UC Course  I have reviewed the triage vital signs and the nursing notes.  Pertinent labs & imaging results that were available during my care of the patient were reviewed by me and considered in my medical decision making (see chart for details).    We will test vaginal self swab today for gonorrhea, chlamydia, yeast vaginitis, bacterial vaginosis, and trichomonas.  Treat as indicated.  Urine sample today does not show signs of acute urinary tract infection.  We will also screen for HIV and syphilis per  patient request.  Encouraged continued condom use with every sexual encounter. Final Clinical Impressions(s) / UC Diagnoses   Final diagnoses:  Vaginal discharge  Routine screening for STI (sexually transmitted infection)     Discharge Instructions      - We are testing you today for causes of vaginal discharge - The urine sample does not show signs of acute infection - We will let you know with positive results; please continue to use condoms with every sexual encounter    ED Prescriptions   None    PDMP not reviewed this encounter.   Valentino Nose, NP 08/04/21 1534

## 2021-08-04 NOTE — Discharge Instructions (Signed)
-   We are testing you today for causes of vaginal discharge - The urine sample does not show signs of acute infection - We will let you know with positive results; please continue to use condoms with every sexual encounter

## 2021-08-05 LAB — CERVICOVAGINAL ANCILLARY ONLY
Bacterial Vaginitis (gardnerella): NEGATIVE
Candida Glabrata: NEGATIVE
Candida Vaginitis: POSITIVE — AB
Chlamydia: NEGATIVE
Comment: NEGATIVE
Comment: NEGATIVE
Comment: NEGATIVE
Comment: NEGATIVE
Comment: NEGATIVE
Comment: NORMAL
Neisseria Gonorrhea: NEGATIVE
Trichomonas: NEGATIVE

## 2021-08-05 LAB — RPR: RPR Ser Ql: NONREACTIVE

## 2021-08-05 LAB — HIV ANTIBODY (ROUTINE TESTING W REFLEX): HIV Screen 4th Generation wRfx: NONREACTIVE

## 2021-08-06 ENCOUNTER — Telehealth: Payer: Self-pay | Admitting: Emergency Medicine

## 2021-08-06 ENCOUNTER — Telehealth (HOSPITAL_COMMUNITY): Payer: Self-pay | Admitting: Emergency Medicine

## 2021-08-06 MED ORDER — FLUCONAZOLE 150 MG PO TABS: 150.0000 mg | ORAL_TABLET | Freq: Once | ORAL | 0 refills | Status: AC

## 2021-08-06 NOTE — Telephone Encounter (Signed)
Pt called and reported had positive result for yeast infection that crossed over from mychart yesterday. Pt aware that result usually crosses over in [place] sooner than medical staff is able to review result and follow-up with pt.Pt aware that if does not hear anything by tomorrow to call back to UC.

## 2022-01-19 ENCOUNTER — Ambulatory Visit
Admission: EM | Admit: 2022-01-19 | Discharge: 2022-01-19 | Disposition: A | Discharge status: Home / Self Care | Payer: Medicaid Other | Attending: Family Medicine | Admitting: Family Medicine

## 2022-01-19 DIAGNOSIS — Z1152 Encounter for screening for COVID-19: Secondary | ICD-10-CM | POA: Diagnosis not present

## 2022-01-19 DIAGNOSIS — J3489 Other specified disorders of nose and nasal sinuses: Secondary | ICD-10-CM | POA: Diagnosis present

## 2022-01-19 DIAGNOSIS — J029 Acute pharyngitis, unspecified: Secondary | ICD-10-CM | POA: Diagnosis present

## 2022-01-19 LAB — RESP PANEL BY RT-PCR (FLU A&B, COVID) ARPGX2
Influenza A by PCR: NEGATIVE
Influenza B by PCR: NEGATIVE
SARS Coronavirus 2 by RT PCR: NEGATIVE

## 2022-01-19 LAB — POCT RAPID STREP A (OFFICE): Rapid Strep A Screen: NEGATIVE

## 2022-01-19 MED ORDER — FLUTICASONE PROPIONATE 50 MCG/ACT NA SUSP: 1.0000 | Freq: Two times a day (BID) | NASAL | 2 refills | Status: DC

## 2022-01-19 NOTE — ED Triage Notes (Signed)
Pt reports no fever but has a very sore throat since this morning.

## 2022-01-19 NOTE — ED Provider Notes (Signed)
[facility]    CSN: [id number] Arrival date & time: 01/19/22  6606      History   Chief Complaint No chief complaint on file.   HPI Diane [name] is a 26 y.o. female.   Presenting today with sore, swollen feeling throat that started this morning.  Denies fever, cough, significant congestion but has had some postnasal drip and sinus drainage.  So far not trying anything over-the-counter for symptoms.  Son sick with similar symptoms.    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 No pertinent surgical history.  OB History     Gravida  2   Para  2   Term  2   Preterm      AB      Living  2      SAB      IAB      Ectopic      Multiple  0   Live Births  2            Home Medications    Prior to Admission medications   Medication Sig Start Date End Date Taking? Authorizing Provider  fluticasone (FLONASE) 50 MCG/ACT nasal spray Place 1 spray into both nostrils 2 (two) times daily. 01/19/22  Yes Particia Nearing, PA-C  acetaminophen (TYLENOL) 500 MG tablet Take 1,000 mg by mouth every 6 (six) hours as needed for mild pain, moderate pain, fever or headache.     [provider]  cetirizine (ZYRTEC ALLERGY) 10 MG tablet Take 1 tablet (10 mg total) by mouth 2 (two) times daily. 03/19/21   Particia Nearing, PA-C  cholecalciferol (VITAMIN D3) 25 MCG (1000 UNIT) tablet Take 1,000 Units by mouth daily.    [provider]  dicyclomine (BENTYL) 20 MG tablet Take 1 tablet (20 mg total) by mouth 2 (two) times daily. 06/19/20   Wurst, [place], PA-C  Drospirenone (SLYND) 4 MG TABS Take by mouth.    [provider]  Hypromellose (GENTEAL MILD) 0.2 % SOLN Use as directed for eye irritation 06/19/20   Wurst, [place], PA-C  ondansetron (ZOFRAN) 4 MG tablet Take 1 tablet (4 mg total) by mouth every 6 (six) hours. 06/19/20   Wurst, [place], PA-C  triamcinolone cream (KENALOG) 0.1 % Apply 1 application topically 2 (two) times daily. 03/19/21   Particia Nearing, PA-C  norethindrone (ORTHO MICRONOR) 0.35 MG tablet Take 1 tablet (0.35 mg total) by mouth daily. 11/09/16 01/24/19  Lennox Solders, MD  sertraline (ZOLOFT) 100 MG tablet Take 1 tablet (100 mg total) by mouth at bedtime. 12/15/16 01/24/19  Constant, Peggy, MD    Family History Family History  Problem Relation Age of Onset   Hypertension Mother    Hypertension Maternal Grandmother    Diabetes Maternal Grandmother    Congestive Heart Failure Maternal Grandmother    Hypertension Maternal Uncle    Diabetes Maternal Uncle    Hypertension Maternal Uncle    Diabetes Maternal Uncle     Social History Social History   Tobacco Use   Smoking status: Never   Smokeless tobacco: Never  Vaping Use   Vaping Use: Never  used  Substance Use Topics   Alcohol use: No   Drug use: No     Allergies   Cefzil [cefprozil] and Rocephin [ceftriaxone sodium in dextrose]   Review of Systems Review of Systems Per HPI  Physical Exam Triage Vital Signs ED Triage Vitals  Enc Vitals Group     BP 01/19/22 0952 114/77     Pulse Rate 01/19/22 0952 91     Resp 01/19/22 0952 18     Temp 01/19/22 0952 99.1 F (37.3 C)     Temp Source 01/19/22 0952 Oral     SpO2 01/19/22 0952 98 %     Weight --      Height --      Head Circumference --      Peak Flow --      Pain Score 01/19/22 0955 5     Pain Loc --      Pain Edu? --      Excl. in GC? --    No data found.  Updated Vital Signs BP 114/77 (BP Location: Right Arm)   Pulse 91   Temp 99.1 F (37.3 C) (Oral)   Resp 18   SpO2 98%   Visual Acuity Right Eye Distance:   Left Eye  Distance:   Bilateral Distance:    Right Eye Near:   Left Eye Near:    Bilateral Near:     Physical Exam Vitals and nursing note reviewed.  Constitutional:      Appearance: Normal appearance. She is not ill-appearing.  HENT:     Head: Atraumatic.     Right Ear: Tympanic membrane and external ear normal.     Left Ear: Tympanic membrane and external ear normal.     Nose: Nose normal.     Mouth/Throat:     Mouth: Mucous membranes are moist.     Pharynx: Posterior oropharyngeal erythema present. No oropharyngeal exudate.  Eyes:     Extraocular Movements: Extraocular movements intact.     Conjunctiva/sclera: Conjunctivae normal.  Neck:     Comments: Trace cervical adenopathy bilaterally Cardiovascular:     Rate and Rhythm: Normal rate and regular rhythm.     Heart sounds: Normal heart sounds.  Pulmonary:     Effort: Pulmonary effort is normal.     Breath sounds: Normal breath sounds. No wheezing or rales.  Musculoskeletal:        General: Normal range of motion.     Cervical back: Normal range of motion and neck supple.  Lymphadenopathy:     Cervical: Cervical adenopathy present.  Skin:    General: Skin is warm and dry.  Neurological:     Mental Status: She is alert and oriented to person, place, and time.  Psychiatric:        Mood and Affect: Mood normal.        Thought Content: Thought content normal.        Judgment: Judgment normal.      UC Treatments / Results  Labs (all labs ordered are listed, but only abnormal results are displayed) Labs Reviewed  CULTURE, GROUP A STREP ([facility])  RESP PANEL BY RT-PCR (FLU A&B, COVID) [id number]  POCT RAPID STREP A (OFFICE)    EKG   Radiology No results found.  Procedures Procedures (including critical care time)  Medications Ordered in UC Medications - No data to display  Initial Impression / Assessment and Plan / UC Course  I have reviewed the triage vital signs and the nursing  notes.  Pertinent labs & imaging  results that were available during my care of the patient were reviewed by me and considered in my medical decision making (see chart for details).     Rapid strep negative, vitals and exam reassuring and suspicious for possible viral pharyngitis.  Throat culture and respiratory panel pending.  Treat with Flonase for sinus drainage, supportive over-the-counter medications and home care.  Return for worsening symptoms.  Final Clinical Impressions(s) / UC Diagnoses   Final diagnoses:  Acute pharyngitis, unspecified etiology  Sinus drainage   Discharge Instructions   None    ED Prescriptions     Medication Sig Dispense Auth. Provider   fluticasone (FLONASE) 50 MCG/ACT nasal spray Place 1 spray into both nostrils 2 (two) times daily. 16 g Particia Nearing, [place]      PDMP not reviewed this encounter.   Particia Nearing, [place] 01/19/22 1052

## 2022-01-20 ENCOUNTER — Telehealth (HOSPITAL_COMMUNITY): Payer: Self-pay | Admitting: Emergency Medicine

## 2022-01-20 LAB — CULTURE, GROUP A STREP (THRC)

## 2022-01-20 MED ORDER — FLUCONAZOLE 150 MG PO TABS: 150.0000 mg | ORAL_TABLET | Freq: Once | ORAL | 0 refills | Status: AC

## 2022-01-20 MED ORDER — AZITHROMYCIN 250 MG PO TABS: 250.0000 mg | ORAL_TABLET | Freq: Every day | ORAL | 0 refills | Status: DC

## 2022-01-20 NOTE — Telephone Encounter (Signed)
Patient called and requested antibiotic associated yeast medicine due to adding antibiotics for positive strep

## 2022-02-04 ENCOUNTER — Ambulatory Visit
Admission: EM | Admit: 2022-02-04 | Discharge: 2022-02-04 | Disposition: A | Discharge status: Home / Self Care | Payer: Medicaid Other | Attending: Family Medicine | Admitting: Family Medicine

## 2022-02-04 ENCOUNTER — Encounter: Payer: Self-pay | Admitting: Emergency Medicine

## 2022-02-04 DIAGNOSIS — J039 Acute tonsillitis, unspecified: Secondary | ICD-10-CM | POA: Diagnosis present

## 2022-02-04 LAB — POCT RAPID STREP A (OFFICE): Rapid Strep A Screen: NEGATIVE

## 2022-02-04 NOTE — ED Provider Notes (Signed)
[facility]    CSN: [id number] Arrival date & time: 02/04/22  0810      History   Chief Complaint No chief complaint on file.   HPI Diane [name] is a 26 y.o. female.   Patient presenting today with sore, swollen feeling throat that started yesterday, sore lymph nodes in neck.  Denies fever, chills, congestion, cough, chest pain, shortness of breath, abdominal pain, nausea vomiting or diarrhea.  So far taking elderberry syrup and hot teas with minimal relief.  States she is prone to strep throat and wanted to get checked.  No known sick contacts recently.    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 No pertinent surgical history.  OB History     Gravida  2   Para  2   Term  2   Preterm      AB      Living  2      SAB      IAB      Ectopic      Multiple  0   Live Births  2            Home Medications    Prior to Admission medications   Medication Sig Start Date End Date Taking? Authorizing Provider  acetaminophen (TYLENOL) 500 MG tablet Take 1,000 mg by mouth every 6 (six) hours as needed for mild pain, moderate pain, fever or headache.     [provider]  azithromycin (ZITHROMAX) 250 MG tablet Take 1 tablet (250 mg total) by mouth daily. Take first 2 tablets together, then 1 every day until finished. 01/20/22   Lamptey, Britta Mccreedy, MD  cetirizine (ZYRTEC ALLERGY) 10 MG tablet Take 1 tablet (10 mg total) by mouth 2 (two) times daily. 03/19/21   Particia Nearing, PA-C  cholecalciferol (VITAMIN D3) 25 MCG (1000 UNIT) tablet Take 1,000 Units by mouth daily.    [provider]  dicyclomine (BENTYL) 20  MG tablet Take 1 tablet (20 mg total) by mouth 2 (two) times daily. 06/19/20   Wurst, [place], PA-C  Drospirenone (SLYND) 4 MG TABS Take by mouth.    [provider]  fluticasone (FLONASE) 50 MCG/ACT nasal spray Place 1 spray into both nostrils 2 (two) times daily. 01/19/22   Particia Nearing, PA-C  Hypromellose (GENTEAL MILD) 0.2 % SOLN Use as directed for eye irritation 06/19/20   Wurst, [place], PA-C  ondansetron (ZOFRAN) 4 MG tablet Take 1 tablet (4 mg total) by mouth every 6 (six) hours. 06/19/20   Wurst, [place], PA-C  triamcinolone cream (KENALOG) 0.1 % Apply 1 application topically 2 (two) times daily. 03/19/21   Particia Nearing, PA-C  norethindrone (ORTHO MICRONOR) 0.35 MG tablet Take 1 tablet (0.35 mg total) by mouth daily. 11/09/16 01/24/19  Lennox Solders, MD  sertraline (ZOLOFT) 100 MG tablet Take 1 tablet (100 mg total) by mouth at bedtime. 12/15/16 01/24/19  Constant, Peggy, MD    Family History Family History  Problem Relation Age of Onset   Hypertension Mother    Hypertension Maternal Grandmother    Diabetes Maternal Grandmother    Congestive Heart Failure  Maternal Grandmother    Hypertension Maternal Uncle    Diabetes Maternal Uncle    Hypertension Maternal Uncle    Diabetes Maternal Uncle     Social History Social History   Tobacco Use   Smoking status: Never   Smokeless tobacco: Never  Vaping Use   Vaping Use: Never used  Substance Use Topics   Alcohol use: No   Drug use: No     Allergies   Cefzil [cefprozil] and Rocephin [ceftriaxone sodium in dextrose]   Review of Systems Review of Systems Per HPI  Physical Exam Triage Vital Signs ED Triage Vitals  Enc Vitals Group     BP 02/04/22 0820 117/79     Pulse Rate 02/04/22 0820 90     Resp 02/04/22 0820 18     Temp 02/04/22 0820 98.4 F (36.9 C)     Temp Source 02/04/22 0820 Oral     SpO2 02/04/22 0820 97 %     Weight --      Height --      Head Circumference --       Peak Flow --      Pain Score 02/04/22 0821 2     Pain Loc --      Pain Edu? --      Excl. in GC? --    No data found.  Updated Vital Signs BP 117/79 (BP Location: Right Arm)   Pulse 90   Temp 98.4 F (36.9 C) (Oral)   Resp 18   SpO2 97%   Visual Acuity Right Eye Distance:   Left Eye Distance:   Bilateral Distance:    Right Eye Near:   Left Eye Near:    Bilateral Near:     Physical Exam Vitals and nursing note reviewed.  Constitutional:      Appearance: Normal appearance.  HENT:     Head: Atraumatic.     Right Ear: Tympanic membrane and external ear normal.     Left Ear: Tympanic membrane and external ear normal.     Mouth/Throat:     Mouth: Mucous membranes are moist.     Pharynx: Posterior oropharyngeal erythema present. No oropharyngeal exudate.     Comments: Mild bilateral tonsillar erythema, edema.  No exudates, uvula midline, oral airway patent Eyes:     Extraocular Movements: Extraocular movements intact.     Conjunctiva/sclera: Conjunctivae normal.  Neck:     Comments: Trace adenopathy bilaterally Cardiovascular:     Rate and Rhythm: Normal rate and regular rhythm.     Heart sounds: Normal heart sounds.  Pulmonary:     Effort: Pulmonary effort is normal.     Breath sounds: Normal breath sounds. No wheezing or rales.  Musculoskeletal:        General: Normal range of motion.     Cervical back: Normal range of motion and neck supple.  Lymphadenopathy:     Cervical: Cervical adenopathy present.  Skin:    General: Skin is warm and dry.  Neurological:     Mental Status: She is alert and oriented to person, place, and time.  Psychiatric:        Mood and Affect: Mood normal.        Thought Content: Thought content normal.      UC Treatments / Results  Labs (all labs ordered are listed, but only abnormal results are displayed) Labs Reviewed  CULTURE, GROUP A STREP [facility])  POCT RAPID STREP A (OFFICE)    EKG   Radiology  No results  found.  Procedures Procedures (including critical care time)  Medications Ordered in UC Medications - No data to display  Initial Impression / Assessment and Plan / UC Course  I have reviewed the triage vital signs and the nursing notes.  Pertinent labs & imaging results that were available during my care of the patient were reviewed by me and considered in my medical decision making (see chart for details).     Vitals and exam reassuring today, rapid strep negative, throat culture pending.  Discussed supportive over-the-counter medications, home care and return precautions.  Final Clinical Impressions(s) / UC Diagnoses   Final diagnoses:  Acute tonsillitis, unspecified etiology   Discharge Instructions   None    ED Prescriptions   None    PDMP not reviewed this encounter.   Roosvelt Maser [place], [place] 02/04/22 [phone]

## 2022-02-04 NOTE — ED Triage Notes (Signed)
Sore throat since yesterday. Hx of strep before thanksgiving.

## 2022-02-07 LAB — CULTURE, GROUP A STREP (THRC)

## 2022-07-05 ENCOUNTER — Ambulatory Visit
Admission: EM | Admit: 2022-07-05 | Discharge: 2022-07-05 | Disposition: A | Discharge status: Home / Self Care | Payer: Medicaid Other | Attending: Nurse Practitioner | Admitting: Nurse Practitioner

## 2022-07-05 DIAGNOSIS — N898 Other specified noninflammatory disorders of vagina: Secondary | ICD-10-CM | POA: Insufficient documentation

## 2022-07-05 MED ORDER — FLUCONAZOLE 150 MG PO TABS: 150.0000 mg | ORAL_TABLET | Freq: Every day | ORAL | 0 refills | Status: DC

## 2022-07-05 NOTE — Discharge Instructions (Addendum)
Take medication as prescribed. Increase condom use with each sexual encounter. May try boric acid vaginal suppositories to help decrease chance of yeast infection or bacterial vaginosis. Insert one suppository vaginally at bedtime for one week, then insert one suppository weekly. The cytology results should be available within the next 24 to 72 hours.  If the results are positive, you will be contacted to discuss treatment. Follow-up as needed.

## 2022-07-05 NOTE — ED Triage Notes (Signed)
Pt states she is having possible yeast infection, having white discharge with itching that started yesterday.

## 2022-07-05 NOTE — ED Provider Notes (Signed)
[facility]    CSN: [id number] Arrival date & time: 07/05/22  0836      History   Chief Complaint Chief Complaint  Patient presents with   Vaginitis    HPI Diane [name] is a 27 y.o. female.   The history is provided by the patient.   Patient presents for complaints of vaginal itching and vaginal discharge.  Symptoms started over the past 24 hours.  Patient denies vaginal odor, urinary frequency, urgency, hesitancy, dysuria, or hematuria.  Patient reports that she is more prone to getting yeast infections during this time of the year.  Patient reports that her last menstrual cycle was on 05/30/2022.  She reports 1 female partner in the past 90 days.  Patient declines HIV and syphilis testing.  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 No pertinent surgical history.  OB History     Gravida  2   Para  2   Term  2   Preterm      AB      Living  2      SAB      IAB      Ectopic      Multiple  0   Live Births  2            Home Medications    Prior to Admission medications   Medication Sig Start Date End Date Taking? Authorizing Provider  cetirizine (ZYRTEC ALLERGY) 10 MG tablet Take 1 tablet (10 mg total) by mouth 2 (two) times daily. 03/19/21  Yes Particia Nearing, PA-C  cholecalciferol (VITAMIN D3) 25 MCG (1000 UNIT) tablet Take 1,000 Units by mouth daily.   Yes [provider]  Drospirenone (SLYND) 4 MG TABS Take by mouth.   Yes [provider]  fluconazole (DIFLUCAN) 150 MG tablet Take 1 tablet (150 mg total) by mouth daily. Take one tablet by mouth. May repeat in 3 days if symptoms persist. 07/05/22   Yes Tilley Faeth-Warren, Sadie Haber, NP  fluticasone (FLONASE) 50 MCG/ACT nasal spray Place 1 spray into both nostrils 2 (two) times daily. 01/19/22  Yes Particia Nearing, PA-C  acetaminophen (TYLENOL) 500 MG tablet Take 1,000 mg by mouth every 6 (six) hours as needed for mild pain, moderate pain, fever or headache.     [provider]  azithromycin (ZITHROMAX) 250 MG tablet Take 1 tablet (250 mg total) by mouth daily. Take first 2 tablets together, then 1 every day until finished. 01/20/22   Lamptey, Britta Mccreedy, MD  dicyclomine (BENTYL) 20 MG tablet Take 1 tablet (20 mg total) by mouth 2 (two) times daily. 06/19/20   Wurst, [place], PA-C  Hypromellose (GENTEAL MILD) 0.2 % SOLN Use as directed for eye irritation 06/19/20   Wurst, [place], PA-C  ondansetron (ZOFRAN) 4 MG tablet Take 1 tablet (4 mg total) by mouth every 6 (six) hours. 06/19/20   Wurst, [place], PA-C  triamcinolone cream (KENALOG) 0.1 % Apply 1 application topically 2 (two) times daily. 03/19/21   Particia Nearing, PA-C  norethindrone (ORTHO MICRONOR) 0.35 MG tablet Take 1 tablet (0.35 mg total) by mouth daily. 11/09/16 01/24/19  Winfrey,  Harlen Labs, MD  sertraline (ZOLOFT) 100 MG tablet Take 1 tablet (100 mg total) by mouth at bedtime. 12/15/16 01/24/19  Constant, Peggy, MD    Family History Family History  Problem Relation Age of Onset   Hypertension Mother    Hypertension Maternal Grandmother    Diabetes Maternal Grandmother    Congestive Heart Failure Maternal Grandmother    Hypertension Maternal Uncle    Diabetes Maternal Uncle    Hypertension Maternal Uncle    Diabetes Maternal Uncle     Social History Social History   Tobacco Use   Smoking status: Never   Smokeless tobacco: Never  Vaping Use   Vaping Use: Never used  Substance Use Topics   Alcohol use: No   Drug use: No     Allergies   Cefzil [cefprozil] and Rocephin [ceftriaxone sodium in dextrose]   Review of Systems Review of Systems Per  HPI  Physical Exam Triage Vital Signs ED Triage Vitals [07/05/22 1055]  Enc Vitals Group     BP 120/77     Pulse Rate 75     Resp 17     Temp 98.4 F (36.9 C)     Temp Source Oral     SpO2 97 %     Weight      Height      Head Circumference      Peak Flow      Pain Score 0     Pain Loc      Pain Edu?      Excl. in GC?    No data found.  Updated Vital Signs BP 120/77 (BP Location: Right Arm)   Pulse 75   Temp 98.4 F (36.9 C) (Oral)   Resp 17   LMP 05/30/2022 (Approximate)   SpO2 97%   Visual Acuity Right Eye Distance:   Left Eye Distance:   Bilateral Distance:    Right Eye Near:   Left Eye Near:    Bilateral Near:     Physical Exam Vitals and nursing note reviewed.  Constitutional:      General: She is not in acute distress.    Appearance: Normal appearance.  HENT:     Head: Normocephalic.  Eyes:     Extraocular Movements: Extraocular movements intact.     Pupils: Pupils are equal, round, and reactive to light.  Cardiovascular:     Rate and Rhythm: Normal rate and regular rhythm.     Pulses: Normal pulses.     Heart sounds: Normal heart sounds.  Pulmonary:     Effort: Pulmonary effort is normal. No respiratory distress.     Breath sounds: Normal breath sounds. No stridor. No wheezing, rhonchi or rales.  Abdominal:     General: Bowel sounds are normal.     Palpations: Abdomen is soft.     Tenderness: There is no abdominal tenderness.  Genitourinary:    Comments: GU exam deferred, self swab performed  Musculoskeletal:     Cervical back: Normal range of motion.  Skin:    General: Skin is warm and dry.  Neurological:     General: No focal deficit present.     Mental Status: She is alert and oriented to person, place, and time.  Psychiatric:        Mood and Affect: Mood normal.        Behavior: Behavior normal.      UC Treatments / Results  Labs (all labs ordered are listed, but only abnormal results are  displayed) Labs Reviewed - No data  to display  EKG   Radiology No results found.  Procedures Procedures (including critical care time)  Medications Ordered in UC Medications - No data to display  Initial Impression / Assessment and Plan / UC Course  I have reviewed the triage vital signs and the nursing notes.  Pertinent labs & imaging results that were available during my care of the patient were reviewed by me and considered in my medical decision making (see chart for details).  The patient is well-appearing, she is in no acute distress, vital signs are stable.  Cytology swab is pending.  Will treat patient empirically with fluconazole 150 mg for possible yeast infection/vaginitis.  Patient was advised to increase condom use with each sexual encounter.  Patient was advised she will be contacted when cytology results are received to determine if further treatment is indicated.  Patient is in agreement with this plan of care and verbalizes understanding.  All questions were answered.  Patient stable for discharge.   Final Clinical Impressions(s) / UC Diagnoses   Final diagnoses:  Vaginal discharge  Vaginal itching     Discharge Instructions      Take medication as prescribed. Increase condom use with each sexual encounter. May try boric acid vaginal suppositories to help decrease chance of yeast infection or bacterial vaginosis. Insert one suppository vaginally at bedtime for one week, then insert one suppository weekly. The cytology results should be available within the next 24 to 72 hours.  If the results are positive, you will be contacted to discuss treatment. Follow-up as needed.     ED Prescriptions     Medication Sig Dispense Auth. Provider   fluconazole (DIFLUCAN) 150 MG tablet Take 1 tablet (150 mg total) by mouth daily. Take one tablet by mouth. May repeat in 3 days if symptoms persist. 2 tablet Tymel Conely-Warren, Sadie Haber, NP      PDMP not reviewed this encounter.   Abran Cantor, NP 07/05/22 1124

## 2022-07-06 ENCOUNTER — Telehealth (HOSPITAL_COMMUNITY): Payer: Self-pay

## 2022-07-06 LAB — CERVICOVAGINAL ANCILLARY ONLY
Bacterial Vaginitis (gardnerella): POSITIVE — AB
Candida Glabrata: NEGATIVE
Candida Vaginitis: POSITIVE — AB
Chlamydia: NEGATIVE
Comment: NEGATIVE
Comment: NEGATIVE
Comment: NEGATIVE
Comment: NEGATIVE
Comment: NEGATIVE
Comment: NORMAL
Neisseria Gonorrhea: NEGATIVE
Trichomonas: NEGATIVE

## 2022-07-06 MED ORDER — METRONIDAZOLE 500 MG PO TABS: 500.0000 mg | ORAL_TABLET | Freq: Two times a day (BID) | ORAL | 0 refills | Status: DC

## 2022-10-14 ENCOUNTER — Ambulatory Visit
Admission: EM | Admit: 2022-10-14 | Discharge: 2022-10-14 | Disposition: A | Discharge status: Home / Self Care | Payer: Medicaid Other | Attending: Family Medicine | Admitting: Family Medicine

## 2022-10-14 DIAGNOSIS — N760 Acute vaginitis: Secondary | ICD-10-CM | POA: Insufficient documentation

## 2022-10-14 MED ORDER — FLUCONAZOLE 150 MG PO TABS: 150.0000 mg | ORAL_TABLET | Freq: Once | ORAL | 0 refills | Status: AC

## 2022-10-14 NOTE — ED Provider Notes (Signed)
[facility]    CSN: [id number] Arrival date & time: 10/14/22  0806      History   Chief Complaint No chief complaint on file.   HPI Diane [name] is a 27 y.o. female.   Patient presenting today with 3-day history of vaginal discharge, vaginal odor.  Denies significant itching, irritation, rashes or lesions, pelvic or abdominal pain, urinary symptoms, concern for STIs.  So far not tried anything over-the-counter for symptoms.    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 No pertinent surgical history.  OB History     Gravida  2   Para  2   Term  2   Preterm      AB      Living  2      SAB      IAB      Ectopic      Multiple  0   Live Births  2            Home Medications    Prior to Admission medications   Medication Sig Start Date End Date Taking? Authorizing Provider  fluconazole (DIFLUCAN) 150 MG tablet Take 1 tablet (150 mg total) by mouth once for 1 dose. 10/14/22 10/14/22 Yes Particia Nearing, PA-C  acetaminophen (TYLENOL) 500 MG tablet Take 1,000 mg by mouth every 6 (six) hours as needed for mild pain, moderate pain, fever or headache.     [provider]  azithromycin (ZITHROMAX) 250 MG tablet Take 1 tablet (250 mg total) by mouth daily. Take first 2 tablets together, then 1 every day until finished. 01/20/22   Lamptey, Britta Mccreedy, MD  cetirizine (ZYRTEC ALLERGY) 10 MG tablet Take 1 tablet (10 mg total) by mouth 2 (two) times daily. 03/19/21   Particia Nearing, PA-C  cholecalciferol (VITAMIN D3) 25 MCG (1000 UNIT) tablet Take 1,000 Units by mouth daily.    [provider]  dicyclomine  (BENTYL) 20 MG tablet Take 1 tablet (20 mg total) by mouth 2 (two) times daily. 06/19/20   Wurst, [place], PA-C  Drospirenone (SLYND) 4 MG TABS Take by mouth.    [provider]  fluconazole (DIFLUCAN) 150 MG tablet Take 1 tablet (150 mg total) by mouth daily. Take one tablet by mouth. May repeat in 3 days if symptoms persist. 07/05/22   Leath-Warren, Sadie Haber, NP  fluticasone (FLONASE) 50 MCG/ACT nasal spray Place 1 spray into both nostrils 2 (two) times daily. 01/19/22   Particia Nearing, PA-C  Hypromellose (GENTEAL MILD) 0.2 % SOLN Use as directed for eye irritation 06/19/20   Wurst, [place], PA-C  metroNIDAZOLE (FLAGYL) 500 MG tablet Take 1 tablet (500 mg total) by mouth 2 (two) times daily. 07/06/22   Lamptey, Britta Mccreedy, MD  ondansetron (ZOFRAN) 4 MG tablet Take 1 tablet (4 mg total) by mouth every 6 (six) hours. 06/19/20   Wurst, [place], PA-C  triamcinolone cream (KENALOG) 0.1 % Apply 1 application topically 2 (two) times daily. 03/19/21   Particia Nearing, PA-C  norethindrone (ORTHO MICRONOR) 0.35 MG tablet Take 1 tablet (0.35 mg total) by mouth daily. 11/09/16 01/24/19  Lennox Solders,  MD  sertraline (ZOLOFT) 100 MG tablet Take 1 tablet (100 mg total) by mouth at bedtime. 12/15/16 01/24/19  Constant, Peggy, MD    Family History Family History  Problem Relation Age of Onset   Hypertension Mother    Hypertension Maternal Grandmother    Diabetes Maternal Grandmother    Congestive Heart Failure Maternal Grandmother    Hypertension Maternal Uncle    Diabetes Maternal Uncle    Hypertension Maternal Uncle    Diabetes Maternal Uncle     Social History Social History   Tobacco Use   Smoking status: Never   Smokeless tobacco: Never  Vaping Use   Vaping status: Never Used  Substance Use Topics   Alcohol use: No   Drug use: No     Allergies   Cefzil [cefprozil] and Rocephin [ceftriaxone sodium in dextrose]   Review of Systems Review of Systems Per  HPI  Physical Exam Triage Vital Signs ED Triage Vitals  Encounter Vitals Group     BP 10/14/22 0824 121/81     Systolic BP Percentile --      Diastolic BP Percentile --      Pulse Rate 10/14/22 0824 94     Resp 10/14/22 0824 12     Temp 10/14/22 0824 98.6 F (37 C)     Temp Source 10/14/22 0824 Oral     SpO2 10/14/22 0824 94 %     Weight --      Height --      Head Circumference --      Peak Flow --      Pain Score 10/14/22 0826 0     Pain Loc --      Pain Education --      Exclude from Growth Chart --    No data found.  Updated Vital Signs BP 121/81 (BP Location: Right Arm)   Pulse 94   Temp 98.6 F (37 C) (Oral)   Resp 12   SpO2 94%   Breastfeeding No   Visual Acuity Right Eye Distance:   Left Eye Distance:   Bilateral Distance:    Right Eye Near:   Left Eye Near:    Bilateral Near:     Physical Exam Vitals and nursing note reviewed.  Constitutional:      Appearance: Normal appearance. She is not ill-appearing.  HENT:     Head: Atraumatic.  Eyes:     Extraocular Movements: Extraocular movements intact.     Conjunctiva/sclera: Conjunctivae normal.  Cardiovascular:     Rate and Rhythm: Normal rate and regular rhythm.     Heart sounds: Normal heart sounds.  Pulmonary:     Effort: Pulmonary effort is normal.     Breath sounds: Normal breath sounds.  Abdominal:     General: Bowel sounds are normal. There is no distension.     Palpations: Abdomen is soft.     Tenderness: There is no abdominal tenderness. There is no right CVA tenderness, left CVA tenderness or guarding.  Genitourinary:    Comments: GU exam deferred, self swab performed Musculoskeletal:        General: Normal range of motion.     Cervical back: Normal range of motion and neck supple.  Skin:    General: Skin is warm and dry.  Neurological:     Mental Status: She is alert and oriented to person, place, and time.     Motor: No weakness.     Gait: Gait normal.  Psychiatric:  Mood and Affect: Mood normal.        Thought Content: Thought content normal.        Judgment: Judgment normal.    UC Treatments / Results  Labs (all labs ordered are listed, but only abnormal results are displayed) Labs Reviewed  CERVICOVAGINAL ANCILLARY ONLY   EKG   Radiology No results found.  Procedures Procedures (including critical care time)  Medications Ordered in UC Medications - No data to display  Initial Impression / Assessment and Plan / UC Course  I have reviewed the triage vital signs and the nursing notes.  Pertinent labs & imaging results that were available during my care of the patient were reviewed by me and considered in my medical decision making (see chart for details).     Vital signs and exam overall reassuring today, vaginal swab pending.  Has a history of yeast infections so we will treat with Diflucan while awaiting results and adjust if needed based on these.  Discussed supportive over-the-counter measures additionally.  Return for worsening symptoms.  Final Clinical Impressions(s) / UC Diagnoses   Final diagnoses:  Acute vaginitis     Discharge Instructions      Someone will call you if anything comes back abnormal on your vaginal swab and medications can be changed at that time if needed.  I have sent over a medication for yeast in the meantime.    ED Prescriptions     Medication Sig Dispense Auth. Provider   fluconazole (DIFLUCAN) 150 MG tablet Take 1 tablet (150 mg total) by mouth once for 1 dose. 1 tablet Particia Nearing, [place]      PDMP not reviewed this encounter.   Particia Nearing, [place] 10/14/22 [phone]

## 2022-10-14 NOTE — ED Triage Notes (Signed)
Pt c/o vaginal discharge and odor x 3 days.

## 2022-10-14 NOTE — Discharge Instructions (Signed)
Someone will call you if anything comes back abnormal on your vaginal swab and medications can be changed at that time if needed.  I have sent over a medication for yeast in the meantime.

## 2022-10-15 LAB — CERVICOVAGINAL ANCILLARY ONLY
Bacterial Vaginitis (gardnerella): POSITIVE — AB
Candida Glabrata: NEGATIVE
Candida Vaginitis: NEGATIVE
Chlamydia: NEGATIVE
Comment: NEGATIVE
Comment: NEGATIVE
Comment: NEGATIVE
Comment: NEGATIVE
Comment: NEGATIVE
Comment: NORMAL
Neisseria Gonorrhea: NEGATIVE
Trichomonas: NEGATIVE

## 2022-10-18 ENCOUNTER — Telehealth: Payer: Self-pay | Admitting: Emergency Medicine

## 2022-10-18 MED ORDER — METRONIDAZOLE 500 MG PO TABS: 500.0000 mg | ORAL_TABLET | Freq: Two times a day (BID) | ORAL | 0 refills | Status: DC

## 2022-11-08 ENCOUNTER — Ambulatory Visit
Admission: EM | Admit: 2022-11-08 | Discharge: 2022-11-08 | Disposition: A | Discharge status: Home / Self Care | Payer: Medicaid Other | Attending: Nurse Practitioner | Admitting: Nurse Practitioner

## 2022-11-08 DIAGNOSIS — J029 Acute pharyngitis, unspecified: Secondary | ICD-10-CM | POA: Insufficient documentation

## 2022-11-08 LAB — POCT RAPID STREP A (OFFICE): Rapid Strep A Screen: NEGATIVE

## 2022-11-08 NOTE — Discharge Instructions (Signed)
Your rapid strep test was negative today.  A throat culture has been ordered.  If the culture is positive, you will be contacted and provided treatment. Warm salt water gargles 3-4 times daily as needed for throat pain or discomfort. Continue over-the-counter analgesics such as Tylenol or ibuprofen as needed for pain or discomfort. Increase fluids and allow for plenty of rest. Follow-up as needed.

## 2022-11-08 NOTE — ED Provider Notes (Signed)
[facility]    CSN: [id number] Arrival date & time: 11/08/22  1254      History   Chief Complaint Chief Complaint  Patient presents with   Sore Throat    HPI Diane [name] is a 27 y.o. female.   The history is provided by the patient.   Patient presents for complaints of sore throat that started over the past 24 hours.  She also complains of postnasal drainage.  She denies fever, chills, headache, nasal congestion, runny nose, cough, abdominal pain, nausea, vomiting, or diarrhea.  Patient reports that her son was diagnosed with strep approximately 2 to 3 weeks ago.  She has been taking Tylenol for her symptoms.  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 No pertinent surgical history.  OB History     Gravida  2   Para  2   Term  2   Preterm      AB      Living  2      SAB      IAB      Ectopic      Multiple  0   Live Births  2            Home Medications    Prior to Admission medications   Medication Sig Start Date End Date Taking? Authorizing Provider  acetaminophen (TYLENOL) 500 MG tablet Take 1,000 mg by mouth every 6 (six) hours as needed for mild pain, moderate pain, fever or headache.    Yes [provider]  cholecalciferol (VITAMIN D3) 25 MCG (1000 UNIT) tablet Take 1,000 Units by mouth daily.   Yes [provider]  Drospirenone (SLYND) 4 MG TABS Take by mouth.   Yes [provider]  azithromycin (ZITHROMAX) 250 MG tablet Take 1 tablet (250 mg total) by mouth daily. Take first 2 tablets together, then 1 every day until finished. 01/20/22   Lamptey, Britta Mccreedy, MD  cetirizine (ZYRTEC  ALLERGY) 10 MG tablet Take 1 tablet (10 mg total) by mouth 2 (two) times daily. 03/19/21   Particia Nearing, PA-C  dicyclomine (BENTYL) 20 MG tablet Take 1 tablet (20 mg total) by mouth 2 (two) times daily. 06/19/20   Wurst, [place], PA-C  fluconazole (DIFLUCAN) 150 MG tablet Take 1 tablet (150 mg total) by mouth daily. Take one tablet by mouth. May repeat in 3 days if symptoms persist. 07/05/22   Treshawn Allen-Warren, Sadie Haber, NP  fluticasone (FLONASE) 50 MCG/ACT nasal spray Place 1 spray into both nostrils 2 (two) times daily. 01/19/22   Particia Nearing, PA-C  Hypromellose (GENTEAL MILD) 0.2 % SOLN Use as directed for eye irritation 06/19/20   Wurst, [place], PA-C  metroNIDAZOLE (FLAGYL) 500 MG tablet Take 1 tablet (500 mg total) by mouth 2 (two) times daily. 10/18/22   Lamptey, Britta Mccreedy, MD  ondansetron (ZOFRAN) 4 MG tablet Take 1 tablet (4 mg total) by mouth every 6 (six) hours. 06/19/20   Wurst, [place], PA-C  triamcinolone cream (KENALOG) 0.1 % Apply 1 application topically 2 (two) times daily. 03/19/21   Particia Nearing, PA-C  norethindrone (ORTHO MICRONOR) 0.35 MG tablet Take  1 tablet (0.35 mg total) by mouth daily. 11/09/16 01/24/19  Lennox Solders, MD  sertraline (ZOLOFT) 100 MG tablet Take 1 tablet (100 mg total) by mouth at bedtime. 12/15/16 01/24/19  Constant, Peggy, MD    Family History Family History  Problem Relation Age of Onset   Hypertension Mother    Hypertension Maternal Grandmother    Diabetes Maternal Grandmother    Congestive Heart Failure Maternal Grandmother    Hypertension Maternal Uncle    Diabetes Maternal Uncle    Hypertension Maternal Uncle    Diabetes Maternal Uncle     Social History Social History   Tobacco Use   Smoking status: Never   Smokeless tobacco: Never  Vaping Use   Vaping status: Never Used  Substance Use Topics   Alcohol use: No   Drug use: No     Allergies   Cefzil [cefprozil] and Rocephin [ceftriaxone sodium in  dextrose]   Review of Systems Review of Systems Per HPI  Physical Exam Triage Vital Signs ED Triage Vitals  Encounter Vitals Group     BP 11/08/22 1430 112/75     Systolic BP Percentile --      Diastolic BP Percentile --      Pulse Rate 11/08/22 1430 (!) 110     Resp 11/08/22 1430 16     Temp 11/08/22 1430 98.3 F (36.8 C)     Temp Source 11/08/22 1430 Oral     SpO2 11/08/22 1430 96 %     Weight --      Height --      Head Circumference --      Peak Flow --      Pain Score 11/08/22 1427 1     Pain Loc --      Pain Education --      Exclude from Growth Chart --    No data found.  Updated Vital Signs BP 112/75 (BP Location: Right Arm)   Pulse (!) 110   Temp 98.3 F (36.8 C) (Oral)   Resp 16   LMP  (LMP Unknown) Comment: pt states she does not get periods while on birth control  SpO2 96%   Visual Acuity Right Eye Distance:   Left Eye Distance:   Bilateral Distance:    Right Eye Near:   Left Eye Near:    Bilateral Near:     Physical Exam Vitals and nursing note reviewed.  Constitutional:      General: She is not in acute distress.    Appearance: Normal appearance. She is well-developed.  HENT:     Head: Normocephalic.     Right Ear: Tympanic membrane and ear canal normal.     Left Ear: Tympanic membrane and ear canal normal.     Nose: Nose normal. No congestion.     Mouth/Throat:     Mouth: Mucous membranes are moist.     Pharynx: Uvula midline. Posterior oropharyngeal erythema and postnasal drip present. No oropharyngeal exudate or uvula swelling.     Tonsils: No tonsillar exudate. 2+ on the right. 2+ on the left.     Comments: Patient reports large tonsils at baseline. Eyes:     Conjunctiva/sclera: Conjunctivae normal.     Pupils: Pupils are equal, round, and reactive to light.  Cardiovascular:     Rate and Rhythm: Regular rhythm. Tachycardia present.     Pulses: Normal pulses.     Heart sounds: Normal heart sounds.  Pulmonary:     Effort:  Pulmonary  effort is normal. No respiratory distress.     Breath sounds: Normal breath sounds. No stridor. No wheezing, rhonchi or rales.  Abdominal:     General: Bowel sounds are normal.     Palpations: Abdomen is soft.     Tenderness: There is no abdominal tenderness.  Musculoskeletal:     Cervical back: Normal range of motion.  Lymphadenopathy:     Cervical: No cervical adenopathy.  Skin:    General: Skin is warm and dry.  Neurological:     General: No focal deficit present.     Mental Status: She is alert and oriented to person, place, and time.  Psychiatric:        Mood and Affect: Mood normal.        Behavior: Behavior normal.      UC Treatments / Results  Labs (all labs ordered are listed, but only abnormal results are displayed) Labs Reviewed  POCT RAPID STREP A (OFFICE)    EKG   Radiology No results found.  Procedures Procedures (including critical care time)  Medications Ordered in UC Medications - No data to display  Initial Impression / Assessment and Plan / UC Course  I have reviewed the triage vital signs and the nursing notes.  Pertinent labs & imaging results that were available during my care of the patient were reviewed by me and considered in my medical decision making (see chart for details).  The patient is well-appearing, she is in no acute distress, vital signs are stable.  Rapid strep test is negative.  Throat culture is pending.  Patient is requesting use of azithromycin if her culture is positive.  Supportive care recommendations were provided and discussed with the patient to include over-the-counter analgesics, warm salt water gargles, and a soft diet.  Patient is in agreement with this plan of care and verbalizes understanding.  All questions were answered.  Patient is stable for discharge.   Final Clinical Impressions(s) / UC Diagnoses   Final diagnoses:  Sore throat     Discharge Instructions      Your rapid strep test was  negative today.  A throat culture has been ordered.  If the culture is positive, you will be contacted and provided treatment. Warm salt water gargles 3-4 times daily as needed for throat pain or discomfort. Continue over-the-counter analgesics such as Tylenol or ibuprofen as needed for pain or discomfort. Increase fluids and allow for plenty of rest. Follow-up as needed.     ED Prescriptions   None    PDMP not reviewed this encounter.   Abran Cantor, NP 11/08/22 1450

## 2022-11-08 NOTE — ED Triage Notes (Signed)
Pt presents with sore throat that started yesterday. Pt states her son had strep throat 2 weeks ago. Taking tylenol.

## 2022-11-11 LAB — CULTURE, GROUP A STREP (THRC)

## 2022-12-30 ENCOUNTER — Encounter: Payer: Self-pay | Admitting: Emergency Medicine

## 2022-12-30 ENCOUNTER — Ambulatory Visit
Admission: EM | Admit: 2022-12-30 | Discharge: 2022-12-30 | Disposition: A | Discharge status: Home / Self Care | Payer: Medicaid Other | Attending: Nurse Practitioner | Admitting: Nurse Practitioner

## 2022-12-30 DIAGNOSIS — Z113 Encounter for screening for infections with a predominantly sexual mode of transmission: Secondary | ICD-10-CM | POA: Diagnosis present

## 2022-12-30 DIAGNOSIS — N898 Other specified noninflammatory disorders of vagina: Secondary | ICD-10-CM | POA: Insufficient documentation

## 2022-12-30 DIAGNOSIS — N926 Irregular menstruation, unspecified: Secondary | ICD-10-CM | POA: Insufficient documentation

## 2022-12-30 LAB — POCT URINE PREGNANCY: Preg Test, Ur: NEGATIVE

## 2022-12-30 NOTE — ED Provider Notes (Signed)
[facility]    CSN: [id number] Arrival date & time: 12/30/22  0850      History   Chief Complaint No chief complaint on file.   HPI Diane [name] is a 27 y.o. female.   The history is provided by the patient.   Patient presents for complaints of vaginal odor and vaginal discharge that started over the past 24 hours.  Patient states "I am not as concerned about the vaginal discharge as I am about the odor."  She states that the odor does not smell "fishy" but that it does not smell normal.  Patient reports she cannot recall her last menstrual cycle, but she states that approximately 1-2 weeks ago, she had light pinkish discharge with one day of light bleeding. patient reports she is sexually active with 1 female partner.  She is currently on OCP.  Reports history of recurrent BV and yeast infections.  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 No pertinent surgical history.  OB History     Gravida  2   Para  2   Term  2   Preterm      AB      Living  2      SAB      IAB      Ectopic      Multiple  0   Live Births  2            Home Medications    Prior to Admission medications   Medication Sig Start Date End Date Taking? Authorizing Provider  acetaminophen (TYLENOL) 500 MG tablet Take 1,000 mg by mouth every 6 (six) hours as needed for mild pain, moderate pain, fever or headache.     [provider]  cetirizine (ZYRTEC ALLERGY) 10 MG tablet Take 1 tablet (10 mg total) by mouth 2 (two) times daily. 03/19/21   Particia Nearing, PA-C  cholecalciferol (VITAMIN D3) 25 MCG (1000 UNIT) tablet Take 1,000 Units by  mouth daily.    [provider]  norethindrone (ORTHO MICRONOR) 0.35 MG tablet Take 1 tablet (0.35 mg total) by mouth daily. 11/09/16 01/24/19  Lennox Solders, MD  sertraline (ZOLOFT) 100 MG tablet Take 1 tablet (100 mg total) by mouth at bedtime. 12/15/16 01/24/19  Constant, Peggy, MD    Family History Family History  Problem Relation Age of Onset   Hypertension Mother    Hypertension Maternal Grandmother    Diabetes Maternal Grandmother    Congestive Heart Failure Maternal Grandmother    Hypertension Maternal Uncle    Diabetes Maternal Uncle    Hypertension Maternal Uncle    Diabetes Maternal Uncle     Social History Social History   Tobacco Use   Smoking status: Never   Smokeless tobacco: Never  Vaping Use   Vaping status: Never Used  Substance Use Topics   Alcohol use: No   Drug use: No     Allergies   Cefzil [cefprozil] and Rocephin [ceftriaxone sodium in dextrose]   Review of Systems Review of Systems Per HPI  Physical Exam Triage Vital Signs ED Triage Vitals  Encounter Vitals Group  BP 12/30/22 0855 121/83     Systolic BP Percentile --      Diastolic BP Percentile --      Pulse Rate 12/30/22 0855 73     Resp 12/30/22 0855 18     Temp 12/30/22 0855 98.2 F (36.8 C)     Temp Source 12/30/22 0855 Oral     SpO2 12/30/22 0855 98 %     Weight --      Height --      Head Circumference --      Peak Flow --      Pain Score 12/30/22 0856 0     Pain Loc --      Pain Education --      Exclude from Growth Chart --    No data found.  Updated Vital Signs BP 121/83 (BP Location: Right Arm)   Pulse 73   Temp 98.2 F (36.8 C) (Oral)   Resp 18   SpO2 98%   Visual Acuity Right Eye Distance:   Left Eye Distance:   Bilateral Distance:    Right Eye Near:   Left Eye Near:    Bilateral Near:     Physical Exam Vitals and nursing note reviewed.  Constitutional:      General: She is not in acute distress.    Appearance: Normal appearance.   HENT:     Head: Normocephalic.  Eyes:     Extraocular Movements: Extraocular movements intact.     Pupils: Pupils are equal, round, and reactive to light.  Pulmonary:     Effort: Pulmonary effort is normal.  Genitourinary:    Comments: GU exam deferred, self swab performed  Musculoskeletal:     Cervical back: Normal range of motion.  Lymphadenopathy:     Cervical: No cervical adenopathy.  Skin:    General: Skin is warm and dry.  Neurological:     General: No focal deficit present.     Mental Status: She is alert and oriented to person, place, and time.  Psychiatric:        Mood and Affect: Mood normal.        Behavior: Behavior normal.      UC Treatments / Results  Labs (all labs ordered are listed, but only abnormal results are displayed) Labs Reviewed  POCT URINE PREGNANCY  CERVICOVAGINAL ANCILLARY ONLY    EKG   Radiology No results found.  Procedures Procedures (including critical care time)  Medications Ordered in UC Medications - No data to display  Initial Impression / Assessment and Plan / UC Course  I have reviewed the triage vital signs and the nursing notes.  Pertinent labs & imaging results that were available during my care of the patient were reviewed by me and considered in my medical decision making (see chart for details).  Cytology swab is pending.  Urine pregnancy test is negative.  Patient advised we will await cytology results prior to treatment given her current symptoms.  Advised patient to refrain from sexual activity until cytology results are received.  Supportive care recommendations were provided and discussed with the patient to include increasing condom use with each sexual encounter, considering use of work acid vaginal suppositories to help decrease occurrence of BV and yeast, and regular follow-up with gynecology.  Patient is in agreement with this plan of care and verbalizes understanding.  All questions were answered.  Patient  stable for discharge.  Final Clinical Impressions(s) / UC Diagnoses   Final diagnoses:  Vaginal odor  Screening examination for sexually transmitted disease  Abnormal menses     Discharge Instructions      The urine pregnancy test was negative.  Cytology swab is pending.  You will be contacted if the pending test results are abnormal.  You will also have access to results via MyChart. Refrain from sexual intercourse until your cytology results have been received. Increase condom use with each sexual encounter. If your test results are positive, and you require treatment, you should refrain from sexual activity for an additional 7 days after treatment is completed.  If your test results are positive, please notify all sexual partners. To reduce occurrence of BV and/or yeast, recommend over-the-counter boric acid vaginal suppositories.  For the first week, insert 1 suppository vaginally each night at bedtime, then insert 1 suppository vaginally weekly thereafter. Regular follow-up with gynecology for regular checkups. Follow-up as needed.     ED Prescriptions   None    PDMP not reviewed this encounter.   Abran Cantor, NP 12/30/22 [phone]

## 2022-12-30 NOTE — ED Triage Notes (Signed)
White Vaginal discharge with odor since yesterday.

## 2022-12-30 NOTE — Discharge Instructions (Signed)
The urine pregnancy test was negative.  Cytology swab is pending.  You will be contacted if the pending test results are abnormal.  You will also have access to results via MyChart. Refrain from sexual intercourse until your cytology results have been received. Increase condom use with each sexual encounter. If your test results are positive, and you require treatment, you should refrain from sexual activity for an additional 7 days after treatment is completed.  If your test results are positive, please notify all sexual partners. To reduce occurrence of BV and/or yeast, recommend over-the-counter boric acid vaginal suppositories.  For the first week, insert 1 suppository vaginally each night at bedtime, then insert 1 suppository vaginally weekly thereafter. Regular follow-up with gynecology for regular checkups. Follow-up as needed.

## 2022-12-31 LAB — CERVICOVAGINAL ANCILLARY ONLY
Bacterial Vaginitis (gardnerella): POSITIVE — AB
Candida Glabrata: NEGATIVE
Candida Vaginitis: NEGATIVE
Chlamydia: NEGATIVE
Comment: NEGATIVE
Comment: NEGATIVE
Comment: NEGATIVE
Comment: NEGATIVE
Comment: NEGATIVE
Comment: NORMAL
Neisseria Gonorrhea: NEGATIVE
Trichomonas: NEGATIVE

## 2023-01-04 ENCOUNTER — Telehealth: Payer: Self-pay

## 2023-01-04 MED ORDER — FLUCONAZOLE 150 MG PO TABS: 150.0000 mg | ORAL_TABLET | Freq: Once | ORAL | 0 refills | Status: AC

## 2023-01-04 MED ORDER — METRONIDAZOLE 500 MG PO TABS: 500.0000 mg | ORAL_TABLET | Freq: Two times a day (BID) | ORAL | 0 refills | Status: AC

## 2023-02-16 ENCOUNTER — Ambulatory Visit
Admission: EM | Admit: 2023-02-16 | Discharge: 2023-02-16 | Disposition: A | Discharge status: Home / Self Care | Payer: Medicaid Other | Attending: Family Medicine | Admitting: Family Medicine

## 2023-02-16 ENCOUNTER — Encounter: Payer: Self-pay | Admitting: Emergency Medicine

## 2023-02-16 DIAGNOSIS — Z3201 Encounter for pregnancy test, result positive: Secondary | ICD-10-CM

## 2023-02-16 DIAGNOSIS — R109 Unspecified abdominal pain: Secondary | ICD-10-CM

## 2023-02-16 LAB — POCT URINALYSIS DIP (MANUAL ENTRY)
Bilirubin, UA: NEGATIVE
Glucose, UA: NEGATIVE mg/dL
Ketones, POC UA: NEGATIVE mg/dL
Leukocytes, UA: NEGATIVE
Nitrite, UA: NEGATIVE
Protein Ur, POC: NEGATIVE mg/dL
Spec Grav, UA: 1.02 (ref 1.010–1.025)
Urobilinogen, UA: 0.2 U/dL
pH, UA: 6 (ref 5.0–8.0)

## 2023-02-16 LAB — POCT URINE PREGNANCY: Preg Test, Ur: POSITIVE — AB

## 2023-02-16 NOTE — ED Triage Notes (Signed)
Lower abd cramping, urinary frequency and notice some blood in urine since last night

## 2023-02-16 NOTE — ED Provider Notes (Signed)
[facility]    CSN: [id number] Arrival date & time: 02/16/23  1308      History   Chief Complaint No chief complaint on file.   HPI Diane [name] is a 27 y.o. female.   Patient presenting today with several day history of mild lower abdominal cramping, slight spotting with urination and urinary frequency.  Denies fever, chills, flank pain, dysuria, vaginal discharge.  So far not trying to thing over-the-counter for symptoms.  Of note, LMP was 01/13/2023.  She states she is on oral contraceptives but has had 2 pregnancies while on contraceptives.    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 No pertinent surgical history.  OB History     Gravida  2   Para  2   Term  2   Preterm      AB      Living  2      SAB      IAB      Ectopic      Multiple  0   Live Births  2            Home Medications    Prior to Admission medications   Medication Sig Start Date End Date Taking? Authorizing Provider  acetaminophen (TYLENOL) 500 MG tablet Take 1,000 mg by mouth every 6 (six) hours as needed for mild pain, moderate pain, fever or headache.     [provider]  cholecalciferol (VITAMIN D3) 25 MCG (1000 UNIT) tablet Take 1,000 Units by mouth daily.    [provider]  norethindrone (ORTHO MICRONOR) 0.35 MG tablet Take 1 tablet (0.35 mg total) by mouth daily. 11/09/16 01/24/19  Lennox Solders, MD  sertraline (ZOLOFT) 100 MG tablet Take 1 tablet (100 mg total) by mouth at bedtime. 12/15/16 01/24/19  Constant, Peggy, MD    Family History Family History  Problem Relation Age of Onset   Hypertension Mother     Hypertension Maternal Grandmother    Diabetes Maternal Grandmother    Congestive Heart Failure Maternal Grandmother    Hypertension Maternal Uncle    Diabetes Maternal Uncle    Hypertension Maternal Uncle    Diabetes Maternal Uncle     Social History Social History   Tobacco Use   Smoking status: Never   Smokeless tobacco: Never  Vaping Use   Vaping status: Never Used  Substance Use Topics   Alcohol use: No   Drug use: No     Allergies   Cefzil [cefprozil] and Rocephin [ceftriaxone sodium in dextrose]   Review of Systems Review of Systems PER HPI  Physical Exam Triage Vital Signs ED Triage Vitals  Encounter Vitals Group     BP 02/16/23 1326 132/80     Systolic BP Percentile --      Diastolic BP Percentile --      Pulse Rate 02/16/23 1326 (!) 104     Resp 02/16/23 1326 18     Temp 02/16/23 1326 98.6 F (37 C)     Temp Source 02/16/23 1326 Oral     SpO2 02/16/23 1326 94 %     Weight --  Height --      Head Circumference --      Peak Flow --      Pain Score 02/16/23 1327 4     Pain Loc --      Pain Education --      Exclude from Growth Chart --    No data found.  Updated Vital Signs BP 132/80 (BP Location: Right Arm)   Pulse (!) 104   Temp 98.6 F (37 C) (Oral)   Resp 18   LMP 01/13/2023 (Exact Date)   SpO2 94%   Visual Acuity Right Eye Distance:   Left Eye Distance:   Bilateral Distance:    Right Eye Near:   Left Eye Near:    Bilateral Near:     Physical Exam Vitals and nursing note reviewed.  Constitutional:      Appearance: Normal appearance. She is not ill-appearing.  HENT:     Head: Atraumatic.  Eyes:     Extraocular Movements: Extraocular movements intact.     Conjunctiva/sclera: Conjunctivae normal.  Cardiovascular:     Rate and Rhythm: Normal rate and regular rhythm.     Heart sounds: Normal heart sounds.  Pulmonary:     Effort: Pulmonary effort is normal.     Breath sounds: Normal breath sounds.  Abdominal:      General: Bowel sounds are normal. There is no distension.     Palpations: Abdomen is soft.     Tenderness: There is no abdominal tenderness. There is no right CVA tenderness, left CVA tenderness or guarding.  Genitourinary:    Comments: GU exam deferred to GYN Musculoskeletal:        General: Normal range of motion.     Cervical back: Normal range of motion and neck supple.  Skin:    General: Skin is warm and dry.  Neurological:     Mental Status: She is alert and oriented to person, place, and time.  Psychiatric:        Mood and Affect: Mood normal.        Thought Content: Thought content normal.        Judgment: Judgment normal.      UC Treatments / Results  Labs (all labs ordered are listed, but only abnormal results are displayed) Labs Reviewed  POCT URINALYSIS DIP (MANUAL ENTRY) - Abnormal; Notable for the following components:      Result Value   Blood, UA trace-intact (*)    All other components within normal limits  POCT URINE PREGNANCY - Abnormal; Notable for the following components:   Preg Test, Ur Positive (*)    All other components within normal limits    EKG   Radiology No results found.  Procedures Procedures (including critical care time)  Medications Ordered in UC Medications - No data to display  Initial Impression / Assessment and Plan / UC Course  I have reviewed the triage vital signs and the nursing notes.  Pertinent labs & imaging results that were available during my care of the patient were reviewed by me and considered in my medical decision making (see chart for details).     Urinalysis today without evidence of urinary tract infection, positive pregnancy test today.  Discussed either the women's emergency department or her OB/GYN as soon as possible for further evaluation of symptoms.  Offered beta-hCG quantitative blood work today as a baseline, patient declines and wishes to follow-up with her OB/GYN.  Emergency department for  worsening symptoms at any time.  Final Clinical Impressions(s) / UC Diagnoses   Final diagnoses:  Abdominal cramping  Positive pregnancy test     Discharge Instructions      Follow-up as soon as possible with your OB/GYN for a recheck and further evaluation.  If your symptoms worsen at any time, you may go to our women's emergency department at the [facility] campus.    ED Prescriptions   None    PDMP not reviewed this encounter.   Particia Nearing, [place] 02/16/23 1404

## 2023-02-16 NOTE — Discharge Instructions (Signed)
Follow-up as soon as possible with your OB/GYN for a recheck and further evaluation.  If your symptoms worsen at any time, you may go to our women's emergency department at the [facility] campus.

## 2023-02-17 ENCOUNTER — Ambulatory Visit: Payer: Medicaid Other

## 2023-02-21 ENCOUNTER — Inpatient Hospital Stay (HOSPITAL_COMMUNITY)
Admission: AD | Admit: 2023-02-21 | Discharge: 2023-02-21 | Disposition: A | Discharge status: Home / Self Care | Payer: Medicaid Other | Attending: Obstetrics & Gynecology | Admitting: Obstetrics & Gynecology

## 2023-02-21 ENCOUNTER — Encounter (HOSPITAL_COMMUNITY): Payer: Self-pay | Admitting: *Deleted

## 2023-02-21 DIAGNOSIS — R102 Pelvic and perineal pain: Secondary | ICD-10-CM | POA: Diagnosis not present

## 2023-02-21 DIAGNOSIS — O26891 Other specified pregnancy related conditions, first trimester: Secondary | ICD-10-CM | POA: Diagnosis present

## 2023-02-21 DIAGNOSIS — O99611 Diseases of the digestive system complicating pregnancy, first trimester: Secondary | ICD-10-CM | POA: Diagnosis not present

## 2023-02-21 DIAGNOSIS — Z3A01 Less than 8 weeks gestation of pregnancy: Secondary | ICD-10-CM | POA: Diagnosis not present

## 2023-02-21 DIAGNOSIS — K5900 Constipation, unspecified: Secondary | ICD-10-CM | POA: Diagnosis not present

## 2023-02-21 HISTORY — DX: Dermatitis, unspecified: L30.9

## 2023-02-21 HISTORY — DX: Depression, unspecified: F32.A

## 2023-02-21 HISTORY — DX: Unspecified abnormal cytological findings in specimens from vagina: R87.629

## 2023-02-21 LAB — URINALYSIS, ROUTINE W REFLEX MICROSCOPIC
Bilirubin Urine: NEGATIVE
Glucose, UA: NEGATIVE mg/dL
Ketones, ur: NEGATIVE mg/dL
Leukocytes,Ua: NEGATIVE
Nitrite: NEGATIVE
Protein, ur: NEGATIVE mg/dL
Specific Gravity, Urine: 1.03 (ref 1.005–1.030)
pH: 5 (ref 5.0–8.0)

## 2023-02-21 NOTE — MAU Provider Note (Signed)
History     [id number]  Arrival date and time: 02/21/23 1703    Chief Complaint  Patient presents with   Pelvic Pain     HPI Diane [name] is a 27 y.o. at [redacted]w[redacted]d by unsure LMP with PMHx notable for two prior NSVD, who presents for pelvic pain.   Patient reports she has been having intermittent sharp pelvic pains, R>L She was worried about ectopic so came to be evaluated No vaginal bleeding No burning or pain with urination No vaginal discharge, odor, discomfort Some intermittent constipation      OB History     Gravida  3   Para  2   Term  2   Preterm      AB      Living  2      SAB      IAB      Ectopic      Multiple  0   Live Births  2           Past Medical History:  Diagnosis Date   Anemia    Anxiety    Depression    Eczema    Headache    UTI (lower urinary tract infection)    Vaginal Pap smear, abnormal    colpo 2023    Past Surgical History:  Procedure Laterality Date   NO PAST SURGERIES      Family History  Problem Relation Age of Onset   Hypertension Mother    Diabetes Mother        "pre"   Other Father        unknown med hx   Hypertension Maternal Uncle    Diabetes Maternal Uncle    Hypertension Maternal Uncle    Diabetes Maternal Uncle    Hypertension Maternal Grandmother    Diabetes Maternal Grandmother    Congestive Heart Failure Maternal Grandmother     Social History   Socioeconomic History   Marital status: Single    Spouse name: Not on file   Number of children: Not on file   Years of education: Not on file   Highest education level: Not on file  Occupational History   Not on file  Tobacco Use   Smoking status: Never   Smokeless tobacco: Never  Vaping Use   Vaping status: Never Used  Substance and Sexual Activity   Alcohol use: Not Currently   Drug use: No   Sexual activity: Yes    Partners: Female    Birth control/protection: None  Other Topics Concern   Not on file  Social History  Narrative   Not on file   Social Drivers of Health   Financial Resource Strain: Not on file  Food Insecurity: Not on file  Transportation Needs: Not on file  Physical Activity: Not on file  Stress: Not on file  Social Connections: Not on file  Intimate Partner Violence: Not on file    Allergies  Allergen Reactions   Cefzil [Cefprozil] Hives   Rocephin [Ceftriaxone Sodium In Dextrose] Hives    No current facility-administered medications on file prior to encounter.   Current Outpatient Medications on File Prior to Encounter  Medication Sig Dispense Refill   cholecalciferol (VITAMIN D3) 25 MCG (1000 UNIT) tablet Take 1,000 Units by mouth daily.     acetaminophen (TYLENOL) 500 MG tablet Take 1,000 mg by mouth every 6 (six) hours as needed for mild pain, moderate pain, fever or headache.      [DISCONTINUED] norethindrone (  ORTHO MICRONOR) 0.35 MG tablet Take 1 tablet (0.35 mg total) by mouth daily. 1 Package 11   [DISCONTINUED] sertraline (ZOLOFT) 100 MG tablet Take 1 tablet (100 mg total) by mouth at bedtime. 30 tablet 3     ROS Pertinent positives and negative per HPI, all others reviewed and negative  Physical Exam   BP 117/75 (BP Location: Right Arm)   Temp 97.9 F (36.6 C) (Oral)   Resp 17   Ht 5\' 9"  (1.753 m)   Wt 91.9 kg   LMP 01/13/2023 (Exact Date)   SpO2 100%   BMI 29.93 kg/m   Patient Vitals for the past 24 hrs:  BP Temp Temp src Resp SpO2 Height Weight  02/21/23 1802 117/75 97.9 F (36.6 C) Oral 17 100 % 5\' 9"  (1.753 m) 91.9 kg    Physical Exam Vitals reviewed.  Constitutional:      General: She is not in acute distress.    Appearance: She is well-developed. She is not diaphoretic.  Eyes:     General: No scleral icterus. Pulmonary:     Effort: Pulmonary effort is normal. No respiratory distress.  Abdominal:     General: There is no distension.     Palpations: Abdomen is soft.     Tenderness: There is no abdominal tenderness. There is no guarding  or rebound.  Skin:    General: Skin is warm and dry.  Neurological:     Mental Status: She is alert.     Coordination: Coordination normal.      Cervical Exam    Bedside Ultrasound Pt informed that the ultrasound is considered a limited OB ultrasound and is not intended to be a complete ultrasound exam.  Patient also informed that the ultrasound is not being completed with the intent of assessing for fetal or placental anomalies or any pelvic abnormalities.  Explained that the purpose of today's ultrasound is to assess for  viability.  Patient acknowledges the purpose of the exam and the limitations of the study.      My interpretation: First trimester findings: Intrauterine gestational sac seen: yes Gestational sac summary: no other definitive structures seen, though possible faint outline of YS and FP appreciated. No cardiac activity visualized. Bilateral adnexa unremarkable in appearance.    Labs No results found for this or any previous visit (from the past 24 hours).  Imaging No results found.  MAU Course  Procedures Lab Orders         Urinalysis, Routine w reflex microscopic -Urine, Clean Catch    No orders of the defined types were placed in this encounter.  Imaging Orders  No imaging studies ordered today    MDM Moderate (Level 3-4)  Assessment and Plan  # Pelvic pain in pregnancy, first trimester #[redacted] weeks gestation of pregnancy Bedside US shows likely IUP, ectopic pregnancy unlikely with these findings. Discussed return precautions including crescendo abdominal pain, heavy vaginal bleeding needing >1 pad/hour, or fever. Patient of [facility], instructed to call them and schedule viability scan ASAP.    Dispo: discharged to home in stable condition    Venora Maples, MD/MPH 02/21/23 6:49 PM  Allergies as of 02/21/2023       Reactions   Cefzil [cefprozil] Hives   Rocephin [ceftriaxone Sodium In Dextrose] Hives        Medication List     TAKE these  medications    acetaminophen 500 MG tablet Commonly known as: TYLENOL Take 1,000 mg by mouth every 6 (six) hours  as needed for mild pain, moderate pain, fever or headache.   cholecalciferol 25 MCG (1000 UNIT) tablet Commonly known as: VITAMIN D3 Take 1,000 Units by mouth daily.

## 2023-02-21 NOTE — MAU Note (Signed)
Diane [name] is a 27 y.o. at [redacted]w[redacted]d, here in MAU reporting: been having some really bad cramps and some sharp pains in her pelvis.  Have gotten progressively worse over the day. No bleeding or d/c.  Has been going between really soft stools, not watery and constipation the last few days.  No urinary problems.  LMP: 11/14 Onset of complaint: yesterday Pain score: 7 Vitals:   02/21/23 1802  BP: 117/75  Resp: 17  Temp: 97.9 F (36.6 C)  SpO2: 100%      Lab orders placed from triage:  urine collected

## 2023-03-09 ENCOUNTER — Emergency Department (HOSPITAL_COMMUNITY)
Admission: EM | Admit: 2023-03-09 | Discharge: 2023-03-09 | Disposition: A | Discharge status: Home / Self Care | Payer: Medicaid Other | Attending: Student | Admitting: Student

## 2023-03-09 ENCOUNTER — Encounter (HOSPITAL_COMMUNITY): Payer: Self-pay

## 2023-03-09 ENCOUNTER — Other Ambulatory Visit: Payer: Self-pay

## 2023-03-09 DIAGNOSIS — O98511 Other viral diseases complicating pregnancy, first trimester: Secondary | ICD-10-CM | POA: Diagnosis not present

## 2023-03-09 DIAGNOSIS — U071 COVID-19: Secondary | ICD-10-CM | POA: Insufficient documentation

## 2023-03-09 DIAGNOSIS — O99891 Other specified diseases and conditions complicating pregnancy: Secondary | ICD-10-CM | POA: Diagnosis present

## 2023-03-09 LAB — RESP PANEL BY RT-PCR (RSV, FLU A&B, COVID)  RVPGX2
Influenza A by PCR: NEGATIVE
Influenza B by PCR: NEGATIVE
Resp Syncytial Virus by PCR: NEGATIVE
SARS Coronavirus 2 by RT PCR: POSITIVE — AB

## 2023-03-09 LAB — GROUP A STREP BY PCR: Group A Strep by PCR: NOT DETECTED

## 2023-03-09 MED ORDER — ACETAMINOPHEN 500 MG PO TABS
1000.0000 mg | ORAL_TABLET | Freq: Once | ORAL | Status: AC
  Administered 2023-03-09: 1000 mg via ORAL
  Filled 2023-03-09: qty 2

## 2023-03-09 MED ORDER — LORATADINE 10 MG PO TABS
10.0000 mg | ORAL_TABLET | Freq: Once | ORAL | Status: AC
  Administered 2023-03-09: 10 mg via ORAL
  Filled 2023-03-09: qty 1

## 2023-03-09 NOTE — ED Triage Notes (Signed)
 P/t c/o cough, congestion, low grade fevers, body aches, decrease appetite. Pt is around [redacted] weeks pregnant.

## 2023-03-09 NOTE — ED Provider Notes (Signed)
 [place] [facility] AT [facility] Provider Note  CSN: [id number] Arrival date & time: 03/09/23 9350  Chief Complaint(s) Cough, Nasal Congestion, and Generalized Body Aches (/)  HPI Diane [name] is a 28 y.o. female G3, P2 approximately [redacted] weeks pregnant with PMH recurrent strep throat, anxiety, depression who presents emergency room for evaluation of cough, nasal congestion and generalized bodyaches.  States that over the last 48 hours she has had myalgias, cough and headache.  Denies vaginal bleeding or discharge.  Does endorse some intermittent crampy abdominal pain in the right and left lower quadrants that is worse with coughing.  Denies chest pain, shortness of breath, nausea, vomiting or other systemic symptoms.   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 Authorizing Provider  acetaminophen  (TYLENOL ) 500 MG tablet Take 1,000 mg by mouth every 6 (six) hours as needed for mild pain, moderate pain, fever or headache.     [provider]  cholecalciferol (VITAMIN D3) 25 MCG (1000 UNIT) tablet Take 1,000 Units by mouth daily.    [provider]  norethindrone  (ORTHO MICRONOR ) 0.35 MG tablet Take 1 tablet (0.35 mg total) by mouth daily. 11/09/16 01/24/19  Francesco Alan BROCKS, MD  sertraline  (ZOLOFT ) 100 MG tablet Take 1 tablet (100 mg total) by mouth at bedtime. 12/15/16 01/24/19  Constant, Peggy, MD                                                                                                                                     Past Surgical History Past Surgical History:  Procedure Laterality Date   NO PAST SURGERIES     Family History Family History  Problem Relation Age of Onset   Hypertension Mother    Diabetes Mother        pre   Other Father        unknown med hx   Hypertension Maternal Uncle    Diabetes Maternal Uncle    Hypertension Maternal Uncle    Diabetes Maternal Uncle    Hypertension Maternal Grandmother    Diabetes Maternal Grandmother    Congestive Heart Failure Maternal Grandmother     Social History Social History   Tobacco Use   Smoking status: Never   Smokeless tobacco: Never  Vaping Use   Vaping status: Never Used  Substance Use Topics   Alcohol use: Not Currently   Drug  use: No   Allergies Cefzil [cefprozil] and Rocephin [ceftriaxone sodium in dextrose ]  Review of Systems Review of Systems  HENT:  Positive for congestion and rhinorrhea.   Respiratory:  Positive for cough.   Musculoskeletal:  Positive for arthralgias and myalgias.    Physical Exam Vital Signs  I have reviewed the triage vital signs BP 111/79   Pulse 87   Temp 98.4 F (36.9 C) (Oral)   Resp 18   Ht 5' 9 (1.753 m)   Wt 91.6 kg   LMP 01/13/2023 (Exact Date)   SpO2 97%   BMI 29.83 kg/m   Physical Exam Vitals and nursing note reviewed.  Constitutional:      General: She is not in acute distress.    Appearance: She is well-developed.  HENT:     Head: Normocephalic and atraumatic.  Eyes:     Conjunctiva/sclera: Conjunctivae normal.  Cardiovascular:     Rate and Rhythm: Normal rate and regular rhythm.     Heart sounds: No murmur heard. Pulmonary:     Effort: Pulmonary effort is normal. No respiratory distress.     Breath sounds: Normal breath sounds.  Abdominal:     Palpations: Abdomen is soft.     Tenderness: There is no abdominal tenderness.  Musculoskeletal:        General: No swelling.     Cervical back: Neck supple.   Skin:    General: Skin is warm and dry.     Capillary Refill: Capillary refill takes less than 2 seconds.  Neurological:     Mental Status: She is alert.  Psychiatric:        Mood and Affect: Mood normal.     ED Results and Treatments Labs (all labs ordered are listed, but only abnormal results are displayed) Labs Reviewed  RESP PANEL BY RT-PCR (RSV, FLU A&B, COVID)  RVPGX2  GROUP A STREP BY PCR                                                                                                                          Radiology No results found.  Pertinent labs & imaging results that were available during my care of the patient were reviewed by me and considered in my medical decision making (see MDM for details).  Medications Ordered in ED Medications  acetaminophen  (TYLENOL ) tablet 1,000 mg (has no administration in time range)  loratadine  (CLARITIN ) tablet 10 mg (has no administration in time range)  Procedures Procedures  (including critical care time)  Medical Decision Making / ED Course   This patient presents to the ED for concern of cough, myalgias, this involves an extensive number of treatment options, and is a complaint that carries with it a high risk of complications and morbidity.  The differential diagnosis includes unspecified viral URI, COVID-19, influenza, RSV, pneumonia, bacteremia  MDM: Patient seen emergency room for evaluation of cough and myalgias.  Physical exam is largely unremarkable.  No appreciable tenderness in the abdomen.  Pulmonary exam unremarkable.  Patient is COVID-19 positive which does account for his symptoms today.  Given Tylenol  and Zyrtec  for her symptoms and will avoid NSAIDs in the setting of pregnancy.  At this time she is not hypoxic and does not meet inpatient criteria for admission.  Given return  precautions of which she voiced understanding she was discharged   Additional history obtained:  -External records from outside source obtained and reviewed including: Chart review including previous notes, labs, imaging, consultation notes   Lab Tests: -I ordered, reviewed, and interpreted labs.   The pertinent results include:   Labs Reviewed  RESP PANEL BY RT-PCR (RSV, FLU A&B, COVID)  RVPGX2  GROUP A STREP BY PCR       Medicines ordered and prescription drug management: Meds ordered this encounter  Medications   acetaminophen  (TYLENOL ) tablet 1,000 mg   loratadine  (CLARITIN ) tablet 10 mg    -I have reviewed the patients home medicines and have made adjustments as needed  Critical interventions none   Social Determinants of Health:  Factors impacting patients care include: none   Reevaluation: After the interventions noted above, I reevaluated the patient and found that they have :stayed the same  Co morbidities that complicate the patient evaluation  Past Medical History:  Diagnosis Date   Anemia    Anxiety    Depression    Eczema    Headache    UTI (lower urinary tract infection)    Vaginal Pap smear, abnormal    colpo 2023      Dispostion: I considered admission for this patient, but at this time she does not meet inpatient criteria for admission and will be discharged with outpatient follow-up     Final Clinical Impression(s) / ED Diagnoses Final diagnoses:  None     @PCDICTATION @    Albertina Dixon, MD 03/09/23 [phone]

## 2023-03-13 ENCOUNTER — Emergency Department (HOSPITAL_COMMUNITY)
Admission: EM | Admit: 2023-03-13 | Discharge: 2023-03-13 | Disposition: A | Discharge status: Home / Self Care | Payer: Medicaid Other | Attending: Emergency Medicine | Admitting: Emergency Medicine

## 2023-03-13 ENCOUNTER — Emergency Department (HOSPITAL_COMMUNITY): Payer: Medicaid Other

## 2023-03-13 ENCOUNTER — Encounter (HOSPITAL_COMMUNITY): Payer: Self-pay

## 2023-03-13 ENCOUNTER — Other Ambulatory Visit: Payer: Self-pay

## 2023-03-13 DIAGNOSIS — O219 Vomiting of pregnancy, unspecified: Secondary | ICD-10-CM | POA: Diagnosis present

## 2023-03-13 DIAGNOSIS — Z3A08 8 weeks gestation of pregnancy: Secondary | ICD-10-CM | POA: Insufficient documentation

## 2023-03-13 DIAGNOSIS — O26891 Other specified pregnancy related conditions, first trimester: Secondary | ICD-10-CM | POA: Insufficient documentation

## 2023-03-13 DIAGNOSIS — N939 Abnormal uterine and vaginal bleeding, unspecified: Secondary | ICD-10-CM

## 2023-03-13 DIAGNOSIS — R197 Diarrhea, unspecified: Secondary | ICD-10-CM | POA: Diagnosis not present

## 2023-03-13 DIAGNOSIS — R112 Nausea with vomiting, unspecified: Secondary | ICD-10-CM

## 2023-03-13 DIAGNOSIS — O99611 Diseases of the digestive system complicating pregnancy, first trimester: Secondary | ICD-10-CM | POA: Diagnosis not present

## 2023-03-13 LAB — COMPREHENSIVE METABOLIC PANEL
ALT: 12 U/L (ref 0–44)
AST: 16 U/L (ref 15–41)
Albumin: 4.2 g/dL (ref 3.5–5.0)
Alkaline Phosphatase: 37 U/L — ABNORMAL LOW (ref 38–126)
Anion gap: 10 (ref 5–15)
BUN: 10 mg/dL (ref 6–20)
CO2: 21 mmol/L — ABNORMAL LOW (ref 22–32)
Calcium: 9.5 mg/dL (ref 8.9–10.3)
Chloride: 101 mmol/L (ref 98–111)
Creatinine, Ser: 0.6 mg/dL (ref 0.44–1.00)
GFR, Estimated: >60 mL/min (ref >60)
Glucose, Bld: 99 mg/dL (ref 70–99)
Potassium: 4 mmol/L (ref 3.5–5.1)
Sodium: 132 mmol/L — ABNORMAL LOW (ref 135–145)
Total Bilirubin: 1 mg/dL (ref 0.0–1.2)
Total Protein: 8.2 g/dL — ABNORMAL HIGH (ref 6.5–8.1)

## 2023-03-13 LAB — TYPE AND SCREEN
ABO/RH(D): B NEG
Antibody Screen: NEGATIVE

## 2023-03-13 LAB — ABO/RH: ABO/RH(D): B NEG

## 2023-03-13 LAB — CBC WITH DIFFERENTIAL/PLATELET
Abs Immature Granulocytes: 0.01 10*3/uL (ref 0.00–0.07)
Basophils Absolute: 0 10*3/uL (ref 0.0–0.1)
Basophils Relative: 0 %
Eosinophils Absolute: 0 10*3/uL (ref 0.0–0.5)
Eosinophils Relative: 0 %
HCT: 46.1 % — ABNORMAL HIGH (ref 36.0–46.0)
Hemoglobin: 15.1 g/dL — ABNORMAL HIGH (ref 12.0–15.0)
Immature Granulocytes: 0 %
Lymphocytes Relative: 8 %
Lymphs Abs: 0.5 10*3/uL — ABNORMAL LOW (ref 0.7–4.0)
MCH: 31.1 pg (ref 26.0–34.0)
MCHC: 32.8 g/dL (ref 30.0–36.0)
MCV: 94.9 fL (ref 80.0–100.0)
Monocytes Absolute: 0.2 10*3/uL (ref 0.1–1.0)
Monocytes Relative: 4 %
Neutro Abs: 5.6 10*3/uL (ref 1.7–7.7)
Neutrophils Relative %: 88 %
Platelets: 239 10*3/uL (ref 150–400)
RBC: 4.86 MIL/uL (ref 3.87–5.11)
RDW: 11.4 % — ABNORMAL LOW (ref 11.5–15.5)
WBC: 6.3 10*3/uL (ref 4.0–10.5)
nRBC: 0 % (ref 0.0–0.2)

## 2023-03-13 LAB — LIPASE, BLOOD: Lipase: 28 U/L (ref 11–51)

## 2023-03-13 LAB — HCG, QUANTITATIVE, PREGNANCY: hCG, Beta Chain, Quant, S: >250000 m[IU]/mL — ABNORMAL HIGH (ref <5)

## 2023-03-13 MED ORDER — ACETAMINOPHEN 325 MG PO TABS
650.0000 mg | ORAL_TABLET | Freq: Once | ORAL | Status: AC
  Administered 2023-03-13: 650 mg via ORAL
  Filled 2023-03-13: qty 2

## 2023-03-13 MED ORDER — ONDANSETRON HCL 4 MG/2ML IJ SOLN
4.0000 mg | Freq: Once | INTRAMUSCULAR | Status: AC
  Administered 2023-03-13: 4 mg via INTRAVENOUS
  Filled 2023-03-13: qty 2

## 2023-03-13 MED ORDER — DOXYLAMINE-PYRIDOXINE 10-10 MG PO TBEC: 2.0000 | DELAYED_RELEASE_TABLET | Freq: Every day | ORAL | 0 refills | Status: DC

## 2023-03-13 MED ORDER — ONDANSETRON 4 MG PO TBDP
4.0000 mg | ORAL_TABLET | Freq: Once | ORAL | Status: AC
  Administered 2023-03-13: 4 mg via ORAL
  Filled 2023-03-13: qty 1

## 2023-03-13 NOTE — ED Provider Notes (Signed)
 [place] [facility] AT [facility] Provider Note   CSN: [id number] Arrival date & time: 03/13/23  1040     History  Chief Complaint  Patient presents with   Covid Positive    Diane [name] is a 28 y.o. female who presents with vomiting, diarrhea, abdominal cramping and light spotting that started this morning.  She believes she is approximately [redacted] weeks pregnant.  Last menstrual period was 01/13/2023 presented for family medicine on 12/18 when she had a positive pregnancy test.  Had pelvic pain before Christmas and presented to OB on 12/23, bedside ultrasound confirmed IUP.  Patient states her current symptoms feel different then those experience previously.  HPI     Home Medications Prior to Admission medications   Medication Sig Start Date End Date Taking? Authorizing Provider  Doxylamine -Pyridoxine  10-10 MG TBEC Take 2 tablets by mouth daily. 03/13/23  Yes Donnajean Lynwood DEL, PA-C  acetaminophen  (TYLENOL ) 500 MG tablet Take 1,000 mg by mouth every 6 (six) hours as needed for mild pain, moderate pain, fever or headache.     [provider]  cholecalciferol (VITAMIN D3) 25 MCG (1000 UNIT) tablet Take 1,000 Units by mouth daily.    [provider]  norethindrone  (ORTHO MICRONOR ) 0.35 MG tablet Take 1 tablet (0.35 mg total) by mouth daily. 11/09/16 01/24/19  Francesco Alan BROCKS, MD  sertraline  (ZOLOFT ) 100 MG tablet Take 1 tablet (100 mg total) by mouth at bedtime. 12/15/16 01/24/19  Constant, Peggy, MD      Allergies    Cefzil [cefprozil] and Rocephin [ceftriaxone sodium in dextrose ]    Review of Systems   Review of Systems  Gastrointestinal:  Positive for abdominal pain.    Physical Exam Updated Vital Signs BP 122/78   Pulse 83   Temp 98.1 F (36.7 C) (Oral)   Resp 16   Ht 5' 9 (1.753 m)   Wt 91.6 kg   LMP 01/13/2023 (Exact Date)   SpO2 98%   BMI 29.83 kg/m  Physical Exam Vitals and nursing note reviewed.  Constitutional:       General: She is not in acute distress.    Appearance: She is well-developed.  HENT:     Head: Normocephalic and atraumatic.  Eyes:     Conjunctiva/sclera: Conjunctivae normal.  Cardiovascular:     Rate and Rhythm: Normal rate and regular rhythm.     Heart sounds: No murmur heard. Pulmonary:     Effort: Pulmonary effort is normal. No respiratory distress.     Breath sounds: Normal breath sounds.  Abdominal:     General: Bowel sounds are normal.     Palpations: Abdomen is soft.     Comments: Mild generalized abdominal tenderness  Musculoskeletal:        General: No swelling.     Cervical back: Neck supple.  Skin:    General: Skin is warm and dry.     Capillary Refill: Capillary refill takes less than 2 seconds.  Neurological:     Mental Status: She is alert.  Psychiatric:        Mood and Affect: Mood normal.     ED Results / Procedures / Treatments   Labs (all labs ordered are listed, but only abnormal results are displayed) Labs Reviewed  HCG, QUANTITATIVE, PREGNANCY - Abnormal; Notable for the following components:      Result Value   hCG, Beta Chain, Quant, S >250,000 (*)    All other components within normal limits  COMPREHENSIVE METABOLIC  PANEL - Abnormal; Notable for the following components:   Sodium 132 (*)    CO2 21 (*)    Total Protein 8.2 (*)    Alkaline Phosphatase 37 (*)    All other components within normal limits  CBC WITH DIFFERENTIAL/PLATELET - Abnormal; Notable for the following components:   Hemoglobin 15.1 (*)    HCT 46.1 (*)    RDW 11.4 (*)    Lymphs Abs 0.5 (*)    All other components within normal limits  LIPASE, BLOOD  URINALYSIS, ROUTINE W REFLEX MICROSCOPIC  ABO/RH  TYPE AND SCREEN    EKG None  Radiology US  OB LESS THAN 14 WEEKS WITH OB TRANSVAGINAL Result Date: 03/13/2023 CLINICAL DATA:  Pelvic pain and vaginal bleeding in 1st trimester pregnancy. EXAM: OBSTETRIC <14 WK US  AND TRANSVAGINAL OB US  TECHNIQUE: Both transabdominal and  transvaginal ultrasound examinations were performed for complete evaluation of the gestation as well as the maternal uterus, adnexal regions, and pelvic cul-de-sac. Transvaginal technique was performed to assess early pregnancy. COMPARISON:  None Available. FINDINGS: Intrauterine gestational sac: Single Yolk sac:  Visualized. Embryo:  Visualized. Cardiac Activity: Visualized. Heart Rate: 167 bpm CRL:  20 mm   8 w   4 d                  US  EDC: 10/19/2023 Subchorionic hemorrhage: Tiny subchorionic hemorrhage seen along inferior margin of the gestational sac. Maternal uterus/adnexae: Neither ovary is directly visualized, however, no mass or abnormal free fluid identified. IMPRESSION: Single living IUP with estimated gestational age of [redacted] weeks 4 days, and US  EDC of 10/19/2023. Tiny subchorionic hemorrhage noted. Electronically Signed   By: Norleen DELENA Kil M.D.   On: 03/13/2023 17:03    Procedures Procedures    Medications Ordered in ED Medications  ondansetron  (ZOFRAN -ODT) disintegrating tablet 4 mg (4 mg Oral Given 03/13/23 1214)  acetaminophen  (TYLENOL ) tablet 650 mg (650 mg Oral Given 03/13/23 1214)  ondansetron  (ZOFRAN ) injection 4 mg (4 mg Intravenous Given 03/13/23 1723)    ED Course/ Medical Decision Making/ A&P                                 Medical Decision Making Amount and/or Complexity of Data Reviewed Labs: ordered. Radiology: ordered.  Risk OTC drugs. Prescription drug management.   This patient presents to the ED with chief complaint(s) of abdominal cramping, vomiting diarrhea.  The complaint involves an extensive differential diagnosis and also carries with it a high risk of complications and morbidity.   pertinent past medical history as listed in HPI  The differential diagnosis includes  Gastroenteritis, appendicitis, cholecystitis, diverticulitis, ovarian torsion, ectopic pregnancy, ovarian cyst The initial plan is to  Will start with basic labs, obtain hCG quant before  proceeding to ultrasound Additional history obtained:  Records reviewed Care Everywhere/External Records  Initial Assessment:   Patient is hemodynamically stable, nontoxic-appearing, afebrile presenting with vomiting, diarrhea and abdominal cramping that started this morning.  Abdominal exam is nonfocal.  Does describe some very light spotting without frank bleeding.  Regardless we will start with basic labs and pelvic ultrasound to rule out ectopic pregnancy.  Independent ECG interpretation:  None  Independent labs interpretation:  The following labs were independently interpreted:  Lipase unremarkable, CMP without significant abnormality, CBC without significant abnormality  Independent visualization and interpretation of imaging: I independently visualized the following imaging with scope of interpretation limited to determining acute life threatening  conditions related to emergency care: Pelvic ultrasound, which revealed no acute abnormality  Treatment and Reassessment: Patient given Zofran  and Tylenol  upon first assessment with some persistent nausea given Zofran .  Discussed negative workup with patient and discharge plan.  She is agreeable.  Consultations obtained:   none  Disposition:   Patient discharged home.  Given prescription for Diclegis  for nausea.  Encouraged to follow-up with primary care doctor within the next few days and OB/GYN as scheduled. The patient has been appropriately medically screened and/or stabilized in the ED. I have low suspicion for any other emergent medical condition which would require further screening, evaluation or treatment in the ED or require inpatient management. At time of discharge the patient is hemodynamically stable and in no acute distress. I have discussed work-up results and diagnosis with patient and answered all questions. Patient is agreeable with discharge plan. We discussed strict return precautions for returning to the emergency  department and they verbalized understanding.     Social Determinants of Health:   none  This note was dictated with voice recognition software.  Despite best efforts at proofreading, errors may have occurred which can change the documentation meaning.          Final Clinical Impression(s) / ED Diagnoses Final diagnoses:  Nausea and vomiting, unspecified vomiting type  Diarrhea, unspecified type  Vaginal spotting    Rx / DC Orders ED Discharge Orders          Ordered    Doxylamine -Pyridoxine  10-10 MG TBEC  Daily        03/13/23 1718              Donnajean Lynwood VEAR DEVONNA 03/13/23 ARLETHA Suzette Pac, MD 03/14/23 1539

## 2023-03-13 NOTE — Discharge Instructions (Addendum)
 It was a pleasure taking care of you today.  You were evaluated in the emergency room for nausea, vomiting, diarrhea abdominal cramping and spotting.  Your lab work and ultrasound did not show any significant abnormality.  You were given Zofran  for nausea while here in the ED and a prescription for Diclegis  was sent into your pharmacy.  Please drink plenty of fluids and gradually progress your diet.  Recommend you follow-up with your primary care doctor within the next few days and your OB/GYN as scheduled.  If you experience any new or worsening symptoms including persistent vomiting extreme pain please return to the emergency room.

## 2023-03-13 NOTE — ED Triage Notes (Signed)
 Pt reports she was diagnosed with covid Wednesday at this facilty, she had fever, headache, sinus congestion and body aches.  Pt reports she now has vomiting and diarrhea and has started spotting and she is [redacted] weeks pregnant.

## 2023-04-22 ENCOUNTER — Encounter: Payer: Self-pay | Admitting: Emergency Medicine

## 2023-04-22 ENCOUNTER — Ambulatory Visit
Admission: EM | Admit: 2023-04-22 | Discharge: 2023-04-22 | Disposition: A | Discharge status: Home / Self Care | Payer: Medicaid Other | Attending: Family Medicine | Admitting: Family Medicine

## 2023-04-22 DIAGNOSIS — J069 Acute upper respiratory infection, unspecified: Secondary | ICD-10-CM

## 2023-04-22 DIAGNOSIS — H1032 Unspecified acute conjunctivitis, left eye: Secondary | ICD-10-CM | POA: Diagnosis not present

## 2023-04-22 LAB — POCT RAPID STREP A (OFFICE): Rapid Strep A Screen: NEGATIVE

## 2023-04-22 MED ORDER — POLYMYXIN B-TRIMETHOPRIM 10000-0.1 UNIT/ML-% OP SOLN: 1.0000 [drp] | Freq: Four times a day (QID) | OPHTHALMIC | 0 refills | Status: AC

## 2023-04-22 NOTE — ED Triage Notes (Signed)
Sore throat and nasal congestion x 4 days.  Left eye red and draining this morning.  Has been taking sudafed with some relief.

## 2023-04-24 NOTE — ED Provider Notes (Signed)
 [facility]    CSN: [id number] Arrival date & time: 04/22/23  4098      History   Chief Complaint No chief complaint on file.   HPI Diane [name] is a 28 y.o. female.   Presenting today with 4-day history of sore throat, congestion, left eye redness and draining.  Denies fever, chills, chest pain, shortness of breath, abdominal pain, vomiting, diarrhea.  So far trying Sudafed with minimal relief.    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 Authorizing Provider  trimethoprim-polymyxin b (POLYTRIM) ophthalmic solution Place 1 drop into the left eye every 6 (six) hours. 04/22/23  Yes Particia Nearing, PA-C  acetaminophen (TYLENOL) 500 MG tablet Take 1,000 mg by mouth every 6 (six) hours as needed for mild pain, moderate pain, fever or headache.     [provider]  norethindrone (ORTHO MICRONOR) 0.35 MG tablet Take 1 tablet (0.35 mg total) by mouth daily. 11/09/16 01/24/19  Lennox Solders, MD  sertraline (ZOLOFT) 100 MG tablet Take 1 tablet (100 mg total) by mouth at bedtime. 12/15/16 01/24/19  Constant, Peggy, MD    Family History Family History  Problem Relation Age of Onset   Hypertension Mother    Diabetes Mother         "pre"   Other Father        unknown med hx   Hypertension Maternal Uncle    Diabetes Maternal Uncle    Hypertension Maternal Uncle    Diabetes Maternal Uncle    Hypertension Maternal Grandmother    Diabetes Maternal Grandmother    Congestive Heart Failure Maternal Grandmother     Social History Social History   Tobacco Use   Smoking status: Never   Smokeless tobacco: Never  Vaping Use   Vaping status: Never Used  Substance Use Topics   Alcohol use: Not Currently   Drug use: No     Allergies   Cefzil [cefprozil] and Rocephin [ceftriaxone sodium in dextrose]   Review of Systems Review of Systems Per HPI  Physical Exam Triage Vital Signs ED Triage Vitals  Encounter Vitals Group     BP 04/22/23 0949 120/81     Systolic BP Percentile --      Diastolic BP Percentile --      Pulse Rate 04/22/23 0949 82     Resp 04/22/23 0949 18  Temp 04/22/23 0949 97.9 F (36.6 C)     Temp Source 04/22/23 0949 Oral     SpO2 04/22/23 0949 98 %     Weight --      Height --      Head Circumference --      Peak Flow --      Pain Score 04/22/23 0951 4     Pain Loc --      Pain Education --      Exclude from Growth Chart --    No data found.  Updated Vital Signs BP 120/81 (BP Location: Right Arm)   Pulse 82   Temp 97.9 F (36.6 C) (Oral)   Resp 18   LMP 01/13/2023 (Exact Date) Comment: states had a miscarriage January 25th  SpO2 98%   Breastfeeding No   Visual Acuity Right Eye Distance:   Left Eye Distance:   Bilateral Distance:    Right Eye Near:   Left Eye Near:    Bilateral Near:     Physical Exam Vitals and nursing note reviewed.  Constitutional:      Appearance: Normal appearance.  HENT:     Head: Atraumatic.     Right Ear: Tympanic membrane and external ear normal.     Left Ear: Tympanic membrane and external ear normal.     Nose: Rhinorrhea present.     Mouth/Throat:     Mouth: Mucous membranes are moist.     Pharynx: Posterior oropharyngeal  erythema present.  Eyes:     Extraocular Movements: Extraocular movements intact.     Comments: Left conjunctival erythema  Cardiovascular:     Rate and Rhythm: Normal rate and regular rhythm.     Heart sounds: Normal heart sounds.  Pulmonary:     Effort: Pulmonary effort is normal.     Breath sounds: Normal breath sounds. No wheezing or rales.  Musculoskeletal:        General: Normal range of motion.     Cervical back: Normal range of motion and neck supple.  Skin:    General: Skin is warm and dry.  Neurological:     Mental Status: She is alert and oriented to person, place, and time.  Psychiatric:        Mood and Affect: Mood normal.        Thought Content: Thought content normal.      UC Treatments / Results  Labs (all labs ordered are listed, but only abnormal results are displayed) Labs Reviewed  POCT RAPID STREP A (OFFICE)    EKG   Radiology No results found.  Procedures Procedures (including critical care time)  Medications Ordered in UC Medications - No data to display  Initial Impression / Assessment and Plan / UC Course  I have reviewed the triage vital signs and the nursing notes.  Pertinent labs & imaging results that were available during my care of the patient were reviewed by me and considered in my medical decision making (see chart for details).     Vitals and exam overall reassuring, suspect viral respiratory infection with some viral conjunctivitis.  Treat with Polytrim drops, supportive over-the-counter medications and home care.  Return for worsening symptoms.  Final Clinical Impressions(s) / UC Diagnoses   Final diagnoses:  Viral URI  Acute conjunctivitis of left eye, unspecified acute conjunctivitis type   Discharge Instructions   None    ED Prescriptions     Medication Sig Dispense Auth. Provider   trimethoprim-polymyxin b (POLYTRIM) ophthalmic solution  Place 1 drop into the left eye every 6 (six) hours. 10 mL Particia Nearing, [place]      PDMP not reviewed this encounter.   Particia Nearing, [place] 04/24/23 1529

## 2023-07-09 ENCOUNTER — Ambulatory Visit: Admission: EM | Admit: 2023-07-09 | Discharge: 2023-07-09 | Disposition: A | Discharge status: Home / Self Care

## 2023-07-09 DIAGNOSIS — H9203 Otalgia, bilateral: Secondary | ICD-10-CM

## 2023-07-09 DIAGNOSIS — H6121 Impacted cerumen, right ear: Secondary | ICD-10-CM | POA: Diagnosis not present

## 2023-07-09 DIAGNOSIS — J069 Acute upper respiratory infection, unspecified: Secondary | ICD-10-CM | POA: Diagnosis not present

## 2023-07-09 DIAGNOSIS — H6993 Unspecified Eustachian tube disorder, bilateral: Secondary | ICD-10-CM | POA: Diagnosis not present

## 2023-07-09 MED ORDER — PSEUDOEPH-BROMPHEN-DM 30-2-10 MG/5ML PO SYRP: 5.0000 mL | ORAL_SOLUTION | Freq: Four times a day (QID) | ORAL | 0 refills | Status: AC | PRN

## 2023-07-09 MED ORDER — FLUTICASONE PROPIONATE 50 MCG/ACT NA SUSP: 2.0000 | Freq: Every day | NASAL | 0 refills | Status: AC

## 2023-07-09 MED ORDER — CETIRIZINE HCL 10 MG PO TABS: 10.0000 mg | ORAL_TABLET | Freq: Every day | ORAL | 0 refills | Status: AC

## 2023-07-09 NOTE — ED Provider Notes (Signed)
 [facility]    CSN: [id number] Arrival date & time: 07/09/23  1258      History   Chief Complaint No chief complaint on file.   HPI Diane [name] is a 28 y.o. female.   The history is provided by the patient.   Patient presents with a 3-day history of bilateral ear pain and nasal congestion.  Patient reports that she is getting over an upper respiratory infection.  States that ear started bothering her over the past several days.  She states that she has a "dull ache" in the left ear.  Denies ear drainage, fever, chills, dizziness, wheezing, difficulty breathing, abdominal pain, nausea, vomiting, diarrhea, or rash.  Patient reports recurrent history of strep and ear infections as a child.  States she has taken over-the-counter analgesics for her symptoms.  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 Authorizing Provider  brompheniramine-pseudoephedrine-DM 30-2-10 MG/5ML syrup Take 5 mLs by mouth 4 (four) times daily as needed. 07/09/23  Yes Leath-Warren, Belen Bowers, NP  cetirizine  (ZYRTEC ) 10 MG tablet Take 1 tablet (10 mg total) by mouth daily. 07/09/23  Yes Leath-Warren, Belen Bowers, NP  fluticasone  (FLONASE ) 50 MCG/ACT nasal spray Place 2 sprays  into both nostrils daily. 07/09/23  Yes Leath-Warren, Belen Bowers, NP  SLYND 4 MG TABS Take 1 tablet by mouth daily. 06/15/23  Yes [provider]  acetaminophen  (TYLENOL ) 500 MG tablet Take 1,000 mg by mouth every 6 (six) hours as needed for mild pain, moderate pain, fever or headache.     [provider]  trimethoprim -polymyxin b  (POLYTRIM ) ophthalmic solution Place 1 drop into the left eye every 6 (six) hours. 04/22/23   Corbin Dess, PA-C  norethindrone  (ORTHO MICRONOR ) 0.35 MG tablet Take 1 tablet (0.35 mg total) by mouth daily. 11/09/16 01/24/19  Ronna Coho, MD  sertraline  (ZOLOFT ) 100 MG tablet Take 1 tablet (100 mg total) by mouth at bedtime. 12/15/16 01/24/19  Constant, Peggy, MD    Family History Family History  Problem Relation Age of Onset   Hypertension Mother    Diabetes Mother        "pre"   Other Father        unknown med hx   Hypertension Maternal Uncle    Diabetes Maternal Uncle    Hypertension Maternal Uncle    Diabetes Maternal Uncle    Hypertension Maternal Grandmother  Diabetes Maternal Grandmother    Congestive Heart Failure Maternal Grandmother     Social History Social History   Tobacco Use   Smoking status: Never   Smokeless tobacco: Never  Vaping Use   Vaping status: Never Used  Substance Use Topics   Alcohol use: Not Currently   Drug use: No     Allergies   Cefzil [cefprozil] and Rocephin [ceftriaxone sodium in dextrose ]   Review of Systems Review of Systems Per HPI  Physical Exam Triage Vital Signs ED Triage Vitals  Encounter Vitals Group     BP 07/09/23 1342 113/74     Systolic BP Percentile --      Diastolic BP Percentile --      Pulse --      Resp 07/09/23 1342 20     Temp 07/09/23 1342 (!) 97.5 F (36.4 C)     Temp Source 07/09/23 1342 Oral     SpO2 07/09/23 1342 97 %     Weight --      Height --      Head Circumference --      Peak Flow --      Pain Score 07/09/23 1343 3     Pain Loc --       Pain Education --      Exclude from Growth Chart --    No data found.  Updated Vital Signs BP 113/74 (BP Location: Right Arm)   Temp (!) 97.5 F (36.4 C) (Oral)   Resp 20   LMP 05/31/2023 (Exact Date)   SpO2 97%   Visual Acuity Right Eye Distance:   Left Eye Distance:   Bilateral Distance:    Right Eye Near:   Left Eye Near:    Bilateral Near:     Physical Exam Vitals and nursing note reviewed.  Constitutional:      General: She is not in acute distress.    Appearance: Normal appearance.  HENT:     Head: Normocephalic.     Right Ear: External ear normal. There is impacted cerumen.     Left Ear: Tympanic membrane, ear canal and external ear normal.     Ears:     Comments: Cerumen impaction of the right ear.  Ear irrigation was performed.  Post irrigation, able to visualize right tympanic membrane.  Right TM was without erythema or bulging.    Nose: Congestion present.     Mouth/Throat:     Mouth: Mucous membranes are moist.     Pharynx: No posterior oropharyngeal erythema.  Eyes:     Extraocular Movements: Extraocular movements intact.     Conjunctiva/sclera: Conjunctivae normal.     Pupils: Pupils are equal, round, and reactive to light.  Cardiovascular:     Rate and Rhythm: Normal rate and regular rhythm.     Pulses: Normal pulses.     Heart sounds: Normal heart sounds.  Pulmonary:     Effort: Pulmonary effort is normal. No respiratory distress.     Breath sounds: Normal breath sounds. No stridor. No wheezing, rhonchi or rales.  Abdominal:     General: Bowel sounds are normal.     Palpations: Abdomen is soft.     Tenderness: There is no abdominal tenderness.  Musculoskeletal:     Cervical back: Normal range of motion.  Lymphadenopathy:     Cervical: No cervical adenopathy.  Skin:    General: Skin is warm and dry.  Neurological:     General: No focal deficit present.  Mental Status: She is alert and oriented to person, place, and time.   Psychiatric:        Mood and Affect: Mood normal.        Behavior: Behavior normal.      UC Treatments / Results  Labs (all labs ordered are listed, but only abnormal results are displayed) Labs Reviewed - No data to display  EKG   Radiology No results found.  Procedures Procedures (including critical care time)  Medications Ordered in UC Medications - No data to display  Initial Impression / Assessment and Plan / UC Course  I have reviewed the triage vital signs and the nursing notes.  Pertinent labs & imaging results that were available during my care of the patient were reviewed by me and considered in my medical decision making (see chart for details).  On initial exam, patient with cerumen impaction of the right ear.  Ear irrigation was performed.  Patient with acute bilateral eustachian tube dysfunction.  Most likely caused by the viral URI.  Will provide treatment for the eustachian tube dysfunction with cetirizine  10 mg, for the nasal congestion, cough, and nasal drainage, Bromfed-DM prescribed, and fluticasone  50 mcg nasal spray for nasal congestion and bilateral eustachian tube dysfunction.  Supportive care recommendations were provided and discussed with the patient to include over-the-counter analgesics, normal saline nasal spray, and use of a humidifier during sleep.  Discussed indications with patient regarding follow-up.  Patient was in agreement with this plan of care and verbalized understanding.  All questions were answered.  Patient stable for discharge.   Final Clinical Impressions(s) / UC Diagnoses   Final diagnoses:  Acute otalgia, bilateral  Impacted cerumen of right ear     Discharge Instructions      Take medication as prescribed. Increase fluids and allow for plenty of rest. You may take over-the-counter Tylenol  or ibuprofen  as needed for pain, fever, or general discomfort. Recommend normal saline nasal spray throughout the day for nasal  congestion and runny nose. For your cough, it may be helpful to use a humidifier in your bedroom at nighttime during sleep and to sleep elevated on pillows while symptoms persist. Apply warm compresses to the ears to help with pain or discomfort. As discussed, if symptoms fail to improve, or if your symptoms appear to be worsening, you may follow-up in this clinic or with your primary care physician for further evaluation. Follow-up as needed.   ED Prescriptions     Medication Sig Dispense Auth. Provider   fluticasone  (FLONASE ) 50 MCG/ACT nasal spray Place 2 sprays into both nostrils daily. 16 g Leath-Warren, Belen Bowers, NP   cetirizine  (ZYRTEC ) 10 MG tablet Take 1 tablet (10 mg total) by mouth daily. 30 tablet Leath-Warren, Belen Bowers, NP   brompheniramine-pseudoephedrine-DM 30-2-10 MG/5ML syrup Take 5 mLs by mouth 4 (four) times daily as needed. 140 mL Leath-Warren, Belen Bowers, NP      PDMP not reviewed this encounter.   Hardy Lia, NP 07/09/23 1436

## 2023-07-09 NOTE — Discharge Instructions (Signed)
 Take medication as prescribed. Increase fluids and allow for plenty of rest. You may take over-the-counter Tylenol  or ibuprofen  as needed for pain, fever, or general discomfort. Recommend normal saline nasal spray throughout the day for nasal congestion and runny nose. For your cough, it may be helpful to use a humidifier in your bedroom at nighttime during sleep and to sleep elevated on pillows while symptoms persist. Apply warm compresses to the ears to help with pain or discomfort. As discussed, if symptoms fail to improve, or if your symptoms appear to be worsening, you may follow-up in this clinic or with your primary care physician for further evaluation. Follow-up as needed.

## 2023-07-09 NOTE — ED Triage Notes (Signed)
 Pt reports she has bilateral ear pain and nasal congestion x 3 days

## 2023-07-27 ENCOUNTER — Emergency Department (HOSPITAL_COMMUNITY)
Admission: EM | Admit: 2023-07-27 | Discharge: 2023-07-28 | Disposition: A | Discharge status: Home / Self Care | Attending: Emergency Medicine | Admitting: Emergency Medicine

## 2023-07-27 DIAGNOSIS — K0889 Other specified disorders of teeth and supporting structures: Secondary | ICD-10-CM | POA: Diagnosis present

## 2023-07-27 NOTE — ED Triage Notes (Signed)
 Pov from home. Cc of dental pain for 2 weeks. Right top and bottom. Pain getting worse. 7/10 Not able to get into dentist until June 16th

## 2023-07-28 ENCOUNTER — Encounter (HOSPITAL_COMMUNITY): Payer: Self-pay

## 2023-07-28 ENCOUNTER — Other Ambulatory Visit: Payer: Self-pay

## 2023-07-28 MED ORDER — AMOXICILLIN 250 MG PO CAPS
500.0000 mg | ORAL_CAPSULE | Freq: Once | ORAL | Status: AC
  Administered 2023-07-28: 500 mg via ORAL
  Filled 2023-07-28: qty 2

## 2023-07-28 MED ORDER — IBUPROFEN 800 MG PO TABS: 800.0000 mg | ORAL_TABLET | Freq: Four times a day (QID) | ORAL | 0 refills | Status: AC | PRN

## 2023-07-28 MED ORDER — IBUPROFEN 800 MG PO TABS
800.0000 mg | ORAL_TABLET | Freq: Once | ORAL | Status: AC
  Administered 2023-07-28: 800 mg via ORAL
  Filled 2023-07-28: qty 1

## 2023-07-28 MED ORDER — AMOXICILLIN 500 MG PO CAPS: 500.0000 mg | ORAL_CAPSULE | Freq: Three times a day (TID) | ORAL | 0 refills | Status: AC

## 2023-07-28 NOTE — ED Provider Notes (Signed)
 [place] EMERGENCY DEPARTMENT AT [facility] Provider Note   CSN: [id number] Arrival date & time: 07/27/23  2346     History  Chief Complaint  Patient presents with   Dental Pain    Diane [name] is a 28 y.o. female.  Presents to the emergency department for evaluation of dental pain.  Patient reports that the pain started on the right upper area of her mouth but now the whole right side of her face is hurting after approximately 1-1/2 weeks.  She has been trying to get a dental appointment, has one scheduled for mid June.       Home Medications Prior to Admission medications   Medication Sig Start Date End Date Taking? Authorizing Provider  amoxicillin  (AMOXIL ) 500 MG capsule Take 1 capsule (500 mg total) by mouth 3 (three) times daily. 07/28/23  Yes Fontaine Kossman, Marine Sia, MD  ibuprofen  (ADVIL ) 800 MG tablet Take 1 tablet (800 mg total) by mouth every 6 (six) hours as needed for moderate pain (pain score 4-6). 07/28/23  Yes Duwan Adrian, Marine Sia, MD  acetaminophen  (TYLENOL ) 500 MG tablet Take 1,000 mg by mouth every 6 (six) hours as needed for mild pain, moderate pain, fever or headache.     [provider]  brompheniramine-pseudoephedrine-DM 30-2-10 MG/5ML syrup Take 5 mLs by mouth 4 (four) times daily as needed. 07/09/23   Leath-Warren, Belen Bowers, NP  cetirizine  (ZYRTEC ) 10 MG tablet Take 1 tablet (10 mg total) by mouth daily. 07/09/23   Leath-Warren, Belen Bowers, NP  fluticasone  (FLONASE ) 50 MCG/ACT nasal spray Place 2 sprays into both nostrils daily. 07/09/23   Leath-Warren, Belen Bowers, NP  SLYND 4 MG TABS Take 1 tablet by mouth daily. 06/15/23   [provider]  trimethoprim -polymyxin b  (POLYTRIM ) ophthalmic solution Place 1 drop into the left eye every 6 (six) hours. 04/22/23   Corbin Dess, PA-C  norethindrone  (ORTHO MICRONOR ) 0.35 MG tablet Take 1 tablet (0.35 mg total) by mouth daily. 11/09/16 01/24/19  Ronna Coho, MD  sertraline   (ZOLOFT ) 100 MG tablet Take 1 tablet (100 mg total) by mouth at bedtime. 12/15/16 01/24/19  Constant, Peggy, MD      Allergies    Cefzil [cefprozil] and Rocephin [ceftriaxone sodium in dextrose ]    Review of Systems   Review of Systems  Physical Exam Updated Vital Signs BP (!) 124/90 (BP Location: Left Arm)   Pulse 78   Temp 98.1 F (36.7 C) (Oral)   Resp 17   Ht 5\' 9"  (1.753 m)   Wt 90.7 kg   LMP 05/31/2023 (Exact Date)   SpO2 99%   BMI 29.53 kg/m  Physical Exam Vitals and nursing note reviewed.  Constitutional:      General: She is not in acute distress.    Appearance: She is well-developed.  HENT:     Head: Normocephalic and atraumatic.     Mouth/Throat:     Mouth: Mucous membranes are moist.  Eyes:     General: Vision grossly intact. Gaze aligned appropriately.     Extraocular Movements: Extraocular movements intact.     Conjunctiva/sclera: Conjunctivae normal.  Cardiovascular:     Rate and Rhythm: Normal rate and regular rhythm.     Pulses: Normal pulses.     Heart sounds: Normal heart sounds, S1 normal and S2 normal. No murmur heard.    No friction rub. No gallop.  Pulmonary:     Effort: Pulmonary effort is normal. No respiratory distress.  Breath sounds: Normal breath sounds.  Abdominal:     General: Bowel sounds are normal.     Palpations: Abdomen is soft.     Tenderness: There is no abdominal tenderness. There is no guarding or rebound.     Hernia: No hernia is present.  Musculoskeletal:        General: No swelling.     Cervical back: Full passive range of motion without pain, normal range of motion and neck supple. No spinous process tenderness or muscular tenderness. Normal range of motion.     Right lower leg: No edema.     Left lower leg: No edema.  Skin:    General: Skin is warm and dry.     Capillary Refill: Capillary refill takes less than 2 seconds.     Findings: No ecchymosis, erythema, rash or wound.  Neurological:     General: No focal  deficit present.     Mental Status: She is alert and oriented to person, place, and time.     GCS: GCS eye subscore is 4. GCS verbal subscore is 5. GCS motor subscore is 6.     Cranial Nerves: Cranial nerves 2-12 are intact.     Sensory: Sensation is intact.     Motor: Motor function is intact.     Coordination: Coordination is intact.  Psychiatric:        Attention and Perception: Attention normal.        Mood and Affect: Mood normal.        Speech: Speech normal.        Behavior: Behavior normal.     ED Results / Procedures / Treatments   Labs (all labs ordered are listed, but only abnormal results are displayed) Labs Reviewed - No data to display  EKG None  Radiology No results found.  Procedures Procedures    Medications Ordered in ED Medications  amoxicillin  (AMOXIL ) capsule 500 mg (has no administration in time range)  ibuprofen  (ADVIL ) tablet 800 mg (has no administration in time range)    ED Course/ Medical Decision Making/ A&P                                 Medical Decision Making Risk Prescription drug management.   Presents with dental that has been ongoing for 1-1/2 weeks.  No signs of any abscess at this time.  Patient appears well, vital signs normal.  Treat empirically with antibiotics, NSAIDs for dental pain.  Patient has a Rocephin allergy.  She reports that she tolerates penicillins.  This was confirmed by looking through her past history, has been given amoxicillin  in the recent past.        Final Clinical Impression(s) / ED Diagnoses Final diagnoses:  Pain, dental    Rx / DC Orders ED Discharge Orders          Ordered    amoxicillin  (AMOXIL ) 500 MG capsule  3 times daily        07/28/23 0035    ibuprofen  (ADVIL ) 800 MG tablet  Every 6 hours PRN        07/28/23 0035              Ballard Bongo, MD 07/28/23 [phone]

## 2023-07-28 NOTE — ED Notes (Signed)
Patient verbalizes understanding of discharge instructions. Opportunity for questioning and answers were provided. Armband removed by staff, pt discharged from ED. Ambulated out to lobby  

## 2023-08-02 ENCOUNTER — Other Ambulatory Visit: Payer: Self-pay | Admitting: Obstetrics and Gynecology

## 2023-08-02 DIAGNOSIS — E049 Nontoxic goiter, unspecified: Secondary | ICD-10-CM

## 2023-08-08 ENCOUNTER — Ambulatory Visit
Admission: RE | Admit: 2023-08-08 | Discharge: 2023-08-08 | Disposition: A | Discharge status: Home / Self Care | Source: Ambulatory Visit | Attending: Obstetrics and Gynecology | Admitting: Obstetrics and Gynecology

## 2023-08-08 DIAGNOSIS — E049 Nontoxic goiter, unspecified: Secondary | ICD-10-CM
